# Patient Record
Sex: Female | Born: 1985 | Race: Black or African American | Hispanic: No | Smoking: Never smoker
Health system: Southern US, Community
[De-identification: ages and names within clinical notes are randomized; demographics above are authoritative.]

## PROBLEM LIST (undated history)

## (undated) ENCOUNTER — Inpatient Hospital Stay (HOSPITAL_COMMUNITY): Payer: Self-pay

## (undated) DIAGNOSIS — Z862 Personal history of diseases of the blood and blood-forming organs and certain disorders involving the immune mechanism: Secondary | ICD-10-CM

## (undated) DIAGNOSIS — Z8619 Personal history of other infectious and parasitic diseases: Secondary | ICD-10-CM

## (undated) DIAGNOSIS — R51 Headache: Secondary | ICD-10-CM

## (undated) DIAGNOSIS — R519 Headache, unspecified: Secondary | ICD-10-CM

## (undated) DIAGNOSIS — D649 Anemia, unspecified: Secondary | ICD-10-CM

## (undated) DIAGNOSIS — B009 Herpesviral infection, unspecified: Secondary | ICD-10-CM

## (undated) DIAGNOSIS — F419 Anxiety disorder, unspecified: Secondary | ICD-10-CM

## (undated) DIAGNOSIS — J45909 Unspecified asthma, uncomplicated: Secondary | ICD-10-CM

## (undated) HISTORY — PX: NO PAST SURGERIES: SHX2092

## (undated) HISTORY — DX: Personal history of diseases of the blood and blood-forming organs and certain disorders involving the immune mechanism: Z86.2

## (undated) HISTORY — DX: Personal history of other infectious and parasitic diseases: Z86.19

---
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
## 2010-06-30 ENCOUNTER — Emergency Department (HOSPITAL_COMMUNITY): Admission: EM | Admit: 2010-06-30 | Discharge: 2010-06-30 | Payer: Self-pay | Admitting: Emergency Medicine

## 2010-11-02 LAB — ABO/RH: ABO/RH(D): A POS

## 2010-11-02 LAB — WET PREP, GENITAL: Yeast Wet Prep HPF POC: NONE SEEN

## 2010-11-02 LAB — POCT PREGNANCY, URINE: Preg Test, Ur: POSITIVE

## 2010-11-02 LAB — RPR: RPR Ser Ql: NONREACTIVE

## 2011-02-04 ENCOUNTER — Inpatient Hospital Stay (HOSPITAL_COMMUNITY)
Admission: AD | Admit: 2011-02-04 | Discharge: 2011-02-04 | Disposition: A | Discharge status: Home / Self Care | Payer: Medicaid Other | Source: Ambulatory Visit | Attending: Obstetrics | Admitting: Obstetrics

## 2011-02-05 ENCOUNTER — Inpatient Hospital Stay (HOSPITAL_COMMUNITY)
Admission: AD | Admit: 2011-02-05 | Discharge: 2011-02-07 | DRG: 775 | Disposition: A | Discharge status: Home / Self Care | Payer: Medicaid Other | Source: Ambulatory Visit | Attending: Obstetrics | Admitting: Obstetrics

## 2011-02-06 LAB — CBC
Hemoglobin: 11.5 g/dL — ABNORMAL LOW (ref 12.0–15.0)
RBC: 3.79 MIL/uL — ABNORMAL LOW (ref 3.87–5.11)
WBC: 10.8 10*3/uL — ABNORMAL HIGH (ref 4.0–10.5)

## 2011-02-07 LAB — RPR: RPR Ser Ql: NONREACTIVE

## 2011-02-08 ENCOUNTER — Inpatient Hospital Stay (HOSPITAL_COMMUNITY): Admission: AD | Admit: 2011-02-08 | Payer: Self-pay | Admitting: Obstetrics & Gynecology

## 2011-07-28 ENCOUNTER — Emergency Department (HOSPITAL_COMMUNITY)
Admission: EM | Admit: 2011-07-28 | Discharge: 2011-07-28 | Disposition: A | Discharge status: Home / Self Care | Payer: Medicaid Other | Attending: Emergency Medicine | Admitting: Emergency Medicine

## 2011-07-28 DIAGNOSIS — J111 Influenza due to unidentified influenza virus with other respiratory manifestations: Secondary | ICD-10-CM | POA: Insufficient documentation

## 2011-07-28 DIAGNOSIS — R000 Tachycardia, unspecified: Secondary | ICD-10-CM | POA: Insufficient documentation

## 2011-07-28 NOTE — ED Notes (Signed)
Generalized pain in head, neck and back. States that cold/flu symptoms started yesterday. States that she had periods of being cold and hot. Did not take her temp. Has taken Alkeseltzer Plus and cough drops with not resolve to symptoms. No one else has symptoms in the home

## 2011-07-28 NOTE — ED Notes (Signed)
No complaints at present. Voices understanding of instructions given. Walked to check out window.  

## 2011-07-28 NOTE — ED Notes (Signed)
States that she is nauseated to complaints of V/D.

## 2011-07-28 NOTE — ED Notes (Signed)
Pt. Reports cough, runny nose, congestion, back/neck pain, and fever.  Pt. Reports nausea, denies vomiting.

## 2011-07-28 NOTE — ED Provider Notes (Signed)
Complains of cough, nonproductive rhinorrhea sore throat headache and cough onset 2 days ago. On exam alert nontoxic lungs clear to auscultation heart regular rate and rhythm rate 108, abdomen nondistended nontender Suspect flu  Doug Sou, MD 07/28/11 1158

## 2011-07-28 NOTE — ED Provider Notes (Signed)
History     CSN: [id number] Arrival date & time: 07/28/2011 10:18 AM   First MD Initiated Contact with Patient 07/28/11 1109      Chief Complaint  Patient presents with  . Headache  . Nasal Congestion  . Cough    (Consider location/radiation/quality/duration/timing/severity/associated sxs/prior Treatment)  HPI Patient is an otherwise healthy 25 year old woman who presents with 2 days of sore throat, rhinorrhea, headache, cough, chest tightness, fevers and chills, and myalgias. She also describes nausea, with no vomiting. She reports 3 loose stools over the past 48 hours. She reports having a flu shot this year. She has no sick contacts.  Past Medical History  Diagnosis Date  . Tuberculosis     History reviewed. No pertinent past surgical history.  No family history on file.  History  Substance Use Topics  . Smoking status: Never Smoker   . Smokeless tobacco: Not on file  . Alcohol Use: No    OB History    Grav Para Term Preterm Abortions TAB SAB Ect Mult Living                  Review of Systems  Allergies  Review of patient's allergies indicates no known allergies.  Home Medications   Current Outpatient Rx  Name Route Sig Dispense Refill  . ASPIRIN 325 MG PO TABS Oral Take 325 mg by mouth every 4 (four) hours as needed. For pain     . SODIUM & POTASSIUM BICARBONATE PO TBEF Oral Take 1 tablet by mouth 2 (two) times daily as needed. For cold symptoms       BP 121/69  Pulse 120  Temp(Src) 100.5 F (38.1 C) (Oral)  Ht 5\' 3"  (1.6 m)  Wt 137 lb (62.143 kg)  BMI 24.27 kg/m2  SpO2 100%  LMP 07/10/2011  Physical Exam GEN: No apparent distress.  Alert and oriented x 3.  Pleasant, conversant, and cooperative to exam. HEENT: head is autraumatic and normocephalic.  Neck is supple without palpable masses or lymphadenopathy.  EOMI.  PERRLA.  Sclerae anicteric.  Conjunctivae without pallor or injection.  Mucous membranes are moist.  Oropharynx is without erythema,  exudates, or other abnormal lesions. RESP:  Lungs are clear to ascultation bilaterally with good air movement.  No wheezes, ronchi, or rubs. CARDIOVASCULAR: tachycardic, normal rhythm.  Clear S1, S2, no murmurs, gallops, or rubs. ABDOMEN: soft, non-tender, non-distended.  Bowels sounds present in all quadrants and normoactive.  No palpable masses. EXT: warm and dry.  Peripheral pulses equal, intact, and +2 globally.  No clubbing or cyanosis.  No edema in b/l lower extremeties SKIN: warm and dry with normal turgor.  No rashes or abnormal lesions observed. NEURO: ambulating w/o difficulty  ED Course  Procedures (including critical care time)  Labs Reviewed - No data to display No results found.   No diagnosis found.    MDM  25 year old otherwise healthy woman with acute onset of flulike symptoms. Repeat heart rate was 108, temperature 100.5. Suspect influenza versus URI. Patient able to tolerate PO, so we will discharge with instructions for Tylenol and fluids.        Kathreen Cosier, MD 07/28/11 1201

## 2013-03-25 ENCOUNTER — Inpatient Hospital Stay (HOSPITAL_COMMUNITY)
Admission: AD | Admit: 2013-03-25 | Discharge: 2013-03-26 | Disposition: A | Discharge status: Home / Self Care | Payer: Medicaid Other | Source: Ambulatory Visit | Attending: Obstetrics and Gynecology | Admitting: Obstetrics and Gynecology

## 2013-03-25 DIAGNOSIS — O4690 Antepartum hemorrhage, unspecified, unspecified trimester: Secondary | ICD-10-CM

## 2013-03-25 DIAGNOSIS — O209 Hemorrhage in early pregnancy, unspecified: Secondary | ICD-10-CM | POA: Insufficient documentation

## 2013-03-25 DIAGNOSIS — R109 Unspecified abdominal pain: Secondary | ICD-10-CM | POA: Insufficient documentation

## 2013-03-26 ENCOUNTER — Encounter (HOSPITAL_COMMUNITY): Payer: Self-pay | Admitting: *Deleted

## 2013-03-26 ENCOUNTER — Inpatient Hospital Stay (HOSPITAL_COMMUNITY): Payer: Medicaid Other

## 2013-03-26 LAB — CBC
Hemoglobin: 11.6 g/dL — ABNORMAL LOW (ref 12.0–15.0)
MCHC: 34.8 g/dL (ref 30.0–36.0)
RBC: 4 MIL/uL (ref 3.87–5.11)
WBC: 6.6 10*3/uL (ref 4.0–10.5)

## 2013-03-26 LAB — WET PREP, GENITAL
Clue Cells Wet Prep HPF POC: NONE SEEN
Trich, Wet Prep: NONE SEEN
Yeast Wet Prep HPF POC: NONE SEEN

## 2013-03-26 LAB — URINALYSIS, ROUTINE W REFLEX MICROSCOPIC
Glucose, UA: NEGATIVE mg/dL
Protein, ur: NEGATIVE mg/dL

## 2013-03-26 LAB — URINE MICROSCOPIC-ADD ON

## 2013-03-26 LAB — HCG, QUANTITATIVE, PREGNANCY: hCG, Beta Chain, Quant, S: 4496 m[IU]/mL — ABNORMAL HIGH (ref <5)

## 2013-03-26 LAB — POCT PREGNANCY, URINE: Preg Test, Ur: POSITIVE — AB

## 2013-03-26 NOTE — MAU Provider Note (Signed)
History     CSN: [id number]  Arrival date and time: 03/25/13 2358   First Provider Initiated Contact with Patient 03/26/13 0052      No chief complaint on file.  HPI [name] is a 27 y.o. [phone] at [redacted]w[redacted]d who presents to MAU today with complain of vaginal bleeding and lower abdominal pain since ~ 2200 today. The [facility] They did not do an exam. She noted blood in her underwear while at church tonight and then moderate cramping started afterwards. She rates her pain at 6/10 now. She denies any pain medications. She denies other discharge, UTI symptoms or diarrhea. She has had occasional N/V throughout the pregnancy as well as constipation. She states that she felt hot earlier tonight when she was nauseous, but denies true fever. LMP was 02/11/13  OB History   Grav Para Term Preterm Abortions TAB SAB Ect Mult Living   5 3 3  1  1   3       Past Medical History  Diagnosis Date  . Tuberculosis     History reviewed. No pertinent past surgical history.  History reviewed. No pertinent family history.  History  Substance Use Topics  . Smoking status: Never Smoker   . Smokeless tobacco: Not on file  . Alcohol Use: No    Allergies: No Known Allergies  Prescriptions prior to admission  Medication Sig Dispense Refill  . ibuprofen (ADVIL,MOTRIN) 200 MG tablet Take 400 mg by mouth every 6 (six) hours as needed for pain.        Review of Systems  Constitutional: Negative for fever, chills and malaise/fatigue.  Gastrointestinal: Positive for nausea, vomiting, abdominal pain and constipation. Negative for diarrhea.  Genitourinary: Negative for dysuria, urgency and frequency.       + vaginal bleeding Neg - vaginal discharge  Neurological: Negative for dizziness and loss of consciousness.   Physical Exam   Blood pressure 116/74, pulse 89, temperature 98 F (36.7 C), temperature source Oral, height  5\' 3"  (1.6 m), weight 170 lb (77.111 kg), last menstrual period 02/11/2013, unknown if currently breastfeeding.  Physical Exam  Constitutional: She is oriented to person, place, and time. She appears well-developed and well-nourished. No distress.  HENT:  Head: Normocephalic and atraumatic.  Cardiovascular: Normal rate, regular rhythm and normal heart sounds.   Respiratory: Effort normal and breath sounds normal. No respiratory distress.  GI: Soft. Bowel sounds are normal. She exhibits no distension and no mass. There is tenderness (mild tenderness to palpation of the suprapubic region at midline). There is no rebound and no guarding.  Genitourinary: Uterus is enlarged (exam limited by maternal body habitus) and tender. Cervix exhibits friability. Cervix exhibits no motion tenderness and no discharge. Right adnexum displays tenderness. Right adnexum displays no mass. Left adnexum displays no mass and no tenderness. No bleeding around the vagina. Vaginal discharge (scant mucus discharge noted) found.  Neurological: She is alert and oriented to person, place, and time.  Skin: Skin is warm and dry. No erythema.  Psychiatric: She has a normal mood and affect.   Results for orders placed during the hospital encounter of 03/25/13 (from the past 24 hour(s))  URINALYSIS, ROUTINE W REFLEX MICROSCOPIC     Status: Abnormal   Collection Time    03/26/13 12:26 AM      Result Value Range   Color, Urine YELLOW  YELLOW   APPearance CLEAR  CLEAR   Specific  Gravity, Urine <1.005 (*) 1.005 - 1.030   pH 6.0  5.0 - 8.0   Glucose, UA NEGATIVE  NEGATIVE mg/dL   Hgb urine dipstick MODERATE (*) NEGATIVE   Bilirubin Urine NEGATIVE  NEGATIVE   Ketones, ur NEGATIVE  NEGATIVE mg/dL   Protein, ur NEGATIVE  NEGATIVE mg/dL   Urobilinogen, UA 0.2  0.0 - 1.0 mg/dL   Nitrite NEGATIVE  NEGATIVE   Leukocytes, UA NEGATIVE  NEGATIVE  URINE MICROSCOPIC-ADD ON     Status: None   Collection Time    03/26/13 12:26 AM       Result Value Range   Squamous Epithelial / LPF RARE  RARE   WBC, UA 0-2  <3 WBC/hpf   RBC / HPF 0-2  <3 RBC/hpf  POCT PREGNANCY, URINE     Status: Abnormal   Collection Time    03/26/13 12:41 AM      Result Value Range   Preg Test, Ur POSITIVE (*) NEGATIVE  CBC     Status: Abnormal   Collection Time    03/26/13 12:50 AM      Result Value Range   WBC 6.6  4.0 - 10.5 K/uL   RBC 4.00  3.87 - 5.11 MIL/uL   Hemoglobin 11.6 (*) 12.0 - 15.0 g/dL   HCT 16.1 (*) 09.6 - 04.5 %   MCV 83.3  78.0 - 100.0 fL   MCH 29.0  26.0 - 34.0 pg   MCHC 34.8  30.0 - 36.0 g/dL   RDW 40.9  81.1 - 91.4 %   Platelets 227  150 - 400 K/uL  HCG, QUANTITATIVE, PREGNANCY     Status: Abnormal   Collection Time    03/26/13 12:50 AM      Result Value Range   hCG, Beta Chain, Quant, S 4496 (*) <5 mIU/mL  WET PREP, GENITAL     Status: Abnormal   Collection Time    03/26/13  1:28 AM      Result Value Range   Yeast Wet Prep HPF POC NONE SEEN  NONE SEEN   Trich, Wet Prep NONE SEEN  NONE SEEN   Clue Cells Wet Prep HPF POC NONE SEEN  NONE SEEN   WBC, Wet Prep HPF POC MODERATE (*) NONE SEEN   US Ob Comp Less 14 Wks  03/26/2013   *RADIOLOGY REPORT*  Clinical Data: Pain in patient with vaginal bleeding.  Cramping. Quantitative HCG 4496.  OBSTETRIC <14 WK [place] AND TRANSVAGINAL OB US  Technique:  Both transabdominal and transvaginal ultrasound examinations were performed for complete evaluation of the gestation as well as the maternal uterus, adnexal regions, and pelvic cul-de-sac.  Transvaginal technique was performed to assess early pregnancy.  Comparison:  None.  Intrauterine gestational sac:  Visualized/normal in shape. Yolk sac: Visualized. Embryo: Not visualized. Cardiac Activity: Not applicable.  MSD: 0.56 mm  5 w 1 d         [place] EDC: 11/25/2013  Maternal uterus/adnexae: Unremarkable.  Small amount of free pelvic fluid is noted.  IMPRESSION: Probable early intrauterine gestational sac, but no fetal pole, or cardiac activity  yet visualized.  Recommend follow-up quantitative B-HCG levels and follow-up US in 14 days to confirm and assess viability.   Original Report Authenticated By: Holley Dexter, M.D.   US Ob Transvaginal  03/26/2013   *RADIOLOGY REPORT*  Clinical Data: Pain in patient with vaginal bleeding.  Cramping. Quantitative HCG 4496.  OBSTETRIC <14 WK [place] AND TRANSVAGINAL OB US  Technique:  Both transabdominal  and transvaginal ultrasound examinations were performed for complete evaluation of the gestation as well as the maternal uterus, adnexal regions, and pelvic cul-de-sac.  Transvaginal technique was performed to assess early pregnancy.  Comparison:  None.  Intrauterine gestational sac:  Visualized/normal in shape. Yolk sac: Visualized. Embryo: Not visualized. Cardiac Activity: Not applicable.  MSD: 0.56 mm  5 w 1 d         [place] EDC: 11/25/2013  Maternal uterus/adnexae: Unremarkable.  Small amount of free pelvic fluid is noted.  IMPRESSION: Probable early intrauterine gestational sac, but no fetal pole, or cardiac activity yet visualized.  Recommend follow-up quantitative B-HCG levels and follow-up US in 14 days to confirm and assess viability.   Original Report Authenticated By: Holley Dexter, M.D.    MAU Course  Procedures None  MDM UPT - positive UA, Wet prep, GC/Chlamydia, CBC, quant hCG and [place] today A+ blood type from previous visit in Epic Patient educated about stopping ibuprofen and using tylenol for pain. Advised to start prenatal care ASAP. Discussed likelihood of vaginal bleeding after intercourse or exam.   Assessment and Plan  A: IUGS and YS at 5w 1d Vaginal bleeding in early pregnancy, most likely secondary to recent intercourse  P: Discharge home Pelvic rest advised Bleeding precautions discussed [facility] and [facility] to go to Dr. Gaynell Face for prenatal care Patient may return to MAU as needed or if her condition were to change or  worsen  Freddi Starr, PA-C  03/26/2013, 2:37 AM

## 2013-03-26 NOTE — MAU Note (Addendum)
PT SAYS SHE WAS AT [facility] TONIGHT AT 1030-   SHE WENT TO B-ROOM- SAW BLOOD ON UNDERWEAR-  DARK- THEN WIPED-  LIGHT RED.   AT 2310-  STARTED HAVING   CRAMPING.   . IN TRIAGE - NO PAD. CRAMPS-  NO TO BAD.    [facility]-   TO [facility]FIRM PREG.  LAST SEX-  Friday.

## 2013-03-27 NOTE — MAU Provider Note (Signed)
Attestation of Attending Supervision of Advanced Practitioner (CNM/NP): Evaluation and management procedures were performed by the Advanced Practitioner under my supervision and collaboration.  I have reviewed the Advanced Practitioner's note and chart, and I agree with the management and plan.  Crystalle Popwell 03/27/2013 10:29 AM

## 2013-04-22 ENCOUNTER — Encounter (HOSPITAL_COMMUNITY): Payer: Self-pay

## 2013-04-22 ENCOUNTER — Inpatient Hospital Stay (HOSPITAL_COMMUNITY)
Admission: AD | Admit: 2013-04-22 | Discharge: 2013-04-22 | Disposition: A | Discharge status: Home / Self Care | Payer: Medicaid Other | Source: Ambulatory Visit | Attending: Obstetrics and Gynecology | Admitting: Obstetrics and Gynecology

## 2013-04-22 DIAGNOSIS — N949 Unspecified condition associated with female genital organs and menstrual cycle: Secondary | ICD-10-CM | POA: Insufficient documentation

## 2013-04-22 DIAGNOSIS — R059 Cough, unspecified: Secondary | ICD-10-CM | POA: Insufficient documentation

## 2013-04-22 DIAGNOSIS — O99891 Other specified diseases and conditions complicating pregnancy: Secondary | ICD-10-CM | POA: Insufficient documentation

## 2013-04-22 DIAGNOSIS — R05 Cough: Secondary | ICD-10-CM | POA: Insufficient documentation

## 2013-04-22 DIAGNOSIS — J329 Chronic sinusitis, unspecified: Secondary | ICD-10-CM

## 2013-04-22 DIAGNOSIS — B9689 Other specified bacterial agents as the cause of diseases classified elsewhere: Secondary | ICD-10-CM

## 2013-04-22 DIAGNOSIS — J029 Acute pharyngitis, unspecified: Secondary | ICD-10-CM | POA: Insufficient documentation

## 2013-04-22 DIAGNOSIS — A499 Bacterial infection, unspecified: Secondary | ICD-10-CM

## 2013-04-22 DIAGNOSIS — J019 Acute sinusitis, unspecified: Secondary | ICD-10-CM | POA: Insufficient documentation

## 2013-04-22 MED ORDER — AMOXICILLIN-POT CLAVULANATE 875-125 MG PO TABS: 1.0000 | ORAL_TABLET | Freq: Two times a day (BID) | ORAL | Status: DC

## 2013-04-22 NOTE — MAU Note (Signed)
Coughing/uri s/s began last saturday

## 2013-04-22 NOTE — MAU Provider Note (Signed)
History     CSN: [id number]  Arrival date and time: 04/22/13 1314   First Provider Initiated Contact with Patient 04/22/13 1516      Chief Complaint  Patient presents with  . URI   HPI [name] is a 27 y.o. [phone] at [redacted]w[redacted]d who presents to MAU today with complaint of sore throat, cough, nasal congestions, headache and ear pain x 9 days. The patient has tried OTC medications since symptom onset without significant relief. She endorses subjective fever "a couple of times" this week, but has never taken a temperature. She denies abdominal pain or vaginal bleeding today. She has had some increase in discharge, but states this is similar to previous and declines further exam.   OB History   Grav Para Term Preterm Abortions TAB SAB Ect Mult Living   5 3 3  1  1   3       Past Medical History  Diagnosis Date  . Tuberculosis     Past Surgical History  Procedure Laterality Date  . No past surgeries      History reviewed. No pertinent family history.  History  Substance Use Topics  . Smoking status: Never Smoker   . Smokeless tobacco: Never Used  . Alcohol Use: No    Allergies: No Known Allergies  No prescriptions prior to admission    Review of Systems  Constitutional: Positive for fever. Negative for malaise/fatigue.  HENT: Positive for ear pain, congestion and sore throat.   Respiratory: Positive for cough, sputum production and shortness of breath.   Gastrointestinal: Positive for nausea and vomiting. Negative for abdominal pain.  Genitourinary:       Neg - vaginal bleeding + discharge  Neurological: Positive for headaches.   Physical Exam   Blood pressure 116/77, pulse 92, temperature 98.3 F (36.8 C), temperature source Oral, resp. rate 16, last menstrual period 02/11/2013, SpO2 100.00%.  Physical Exam  Constitutional: She is oriented to person, place, and time. She appears well-developed and well-nourished. No distress.  HENT:  Head:  Normocephalic and atraumatic.  Right Ear: External ear normal.  Left Ear: External ear normal.  Nose: Mucosal edema and rhinorrhea present. No epistaxis. Right sinus exhibits maxillary sinus tenderness and frontal sinus tenderness. Left sinus exhibits maxillary sinus tenderness and frontal sinus tenderness.  Mouth/Throat: Posterior oropharyngeal erythema present. No oropharyngeal exudate, posterior oropharyngeal edema or tonsillar abscesses.  Cardiovascular: Normal rate, regular rhythm and normal heart sounds.   Respiratory: Effort normal and breath sounds normal. No respiratory distress. She has no wheezes. She has no rales.  Lymphadenopathy:       Head (right side): No submental, no submandibular and no tonsillar adenopathy present.       Head (left side): No submental, no submandibular and no tonsillar adenopathy present.  Neurological: She is alert and oriented to person, place, and time.  Skin: Skin is warm and dry. No erythema.    MAU Course  Procedures None  MDM Patient has had symptoms x 9 days and may be progressing to a acute bacterial sinusitis  Discussed OTC symtpoms managament approved in pregnancy  Assessment and Plan  A: Acute Bacterial Sinusitis  P: Discharge home Rx for Augmentin sent to patient's pharmacy Patient advised of OTC medications for symptoms management approved in pregnancy Patient advised to rest and increased PO hydration as tolerated Patient advised to start prenatal care ASAP Patient may return to MAU as needed or if her condition were to change or worsen  Raynelle Fanning  Donzetta Starch, PA-C  04/22/2013, 5:18 PM

## 2013-04-22 NOTE — MAU Note (Signed)
Past wk, severe cold, hard to breath, every time she coughs hard- it makes her throw up.  Has tried different meds that pharmacy has told her she can take, doesn't seem to be helping.  Hearing seems muffled in both ears.  Thinks had fever, never checked it.

## 2013-04-22 NOTE — MAU Note (Signed)
Pt denies bleeding, does have yellow vaginal discharge with slight odor. Denies abdominal pain.

## 2013-04-23 NOTE — MAU Provider Note (Signed)
Attestation of Attending Supervision of Advanced Practitioner (CNM/NP): Evaluation and management procedures were performed by the Advanced Practitioner under my supervision and collaboration.  I have reviewed the Advanced Practitioner's note and chart, and I agree with the management and plan.  Dima Mini 04/23/2013 8:07 AM

## 2013-05-29 LAB — OB RESULTS CONSOLE GC/CHLAMYDIA
Chlamydia: NEGATIVE
GC PROBE AMP, GENITAL: NEGATIVE

## 2013-05-29 LAB — OB RESULTS CONSOLE ANTIBODY SCREEN: Antibody Screen: NEGATIVE

## 2013-05-29 LAB — OB RESULTS CONSOLE ABO/RH: RH TYPE: POSITIVE

## 2013-05-29 LAB — OB RESULTS CONSOLE HIV ANTIBODY (ROUTINE TESTING): HIV: NONREACTIVE

## 2013-05-29 LAB — OB RESULTS CONSOLE RPR: RPR: NONREACTIVE

## 2013-05-29 LAB — OB RESULTS CONSOLE HEPATITIS B SURFACE ANTIGEN: HEP B S AG: NEGATIVE

## 2013-05-29 LAB — OB RESULTS CONSOLE RUBELLA ANTIBODY, IGM: Rubella: IMMUNE

## 2013-08-22 NOTE — L&D Delivery Note (Signed)
Delivery Note At 2:35 PM a viable female was delivered via  (Presentation: ;  ).  APGAR: , ; weight .   Placenta status: , .  Cord:  with the following complications: .  Cord pH: not done  Anesthesia:   Episiotomy:  Lacerations:  Suture Repair: 2.0 Est. Blood Loss (mL):   Mom to postpartum.  Baby to Couplet care / Skin to Skin.  MARSHALL,BERNARD A 11/14/2013, 2:42 PM

## 2013-08-28 ENCOUNTER — Encounter (HOSPITAL_COMMUNITY): Payer: Self-pay | Admitting: *Deleted

## 2013-08-28 ENCOUNTER — Inpatient Hospital Stay (HOSPITAL_COMMUNITY)
Admission: AD | Admit: 2013-08-28 | Discharge: 2013-08-28 | Disposition: A | Discharge status: Home / Self Care | Payer: Medicaid Other | Source: Ambulatory Visit | Attending: Obstetrics | Admitting: Obstetrics

## 2013-08-28 DIAGNOSIS — O99891 Other specified diseases and conditions complicating pregnancy: Secondary | ICD-10-CM | POA: Insufficient documentation

## 2013-08-28 DIAGNOSIS — R059 Cough, unspecified: Secondary | ICD-10-CM | POA: Insufficient documentation

## 2013-08-28 DIAGNOSIS — J111 Influenza due to unidentified influenza virus with other respiratory manifestations: Secondary | ICD-10-CM

## 2013-08-28 DIAGNOSIS — R05 Cough: Secondary | ICD-10-CM | POA: Insufficient documentation

## 2013-08-28 DIAGNOSIS — O9989 Other specified diseases and conditions complicating pregnancy, childbirth and the puerperium: Principal | ICD-10-CM

## 2013-08-28 DIAGNOSIS — N898 Other specified noninflammatory disorders of vagina: Secondary | ICD-10-CM | POA: Insufficient documentation

## 2013-08-28 HISTORY — DX: Anemia, unspecified: D64.9

## 2013-08-28 LAB — URINALYSIS, ROUTINE W REFLEX MICROSCOPIC
Bilirubin Urine: NEGATIVE
Glucose, UA: NEGATIVE mg/dL
Hgb urine dipstick: NEGATIVE
Ketones, ur: NEGATIVE mg/dL
LEUKOCYTES UA: NEGATIVE
NITRITE: NEGATIVE
PROTEIN: NEGATIVE mg/dL
Specific Gravity, Urine: 1.015 (ref 1.005–1.030)
UROBILINOGEN UA: 1 mg/dL (ref 0.0–1.0)
pH: 7.5 (ref 5.0–8.0)

## 2013-08-28 LAB — WET PREP, GENITAL
Clue Cells Wet Prep HPF POC: NONE SEEN
Trich, Wet Prep: NONE SEEN
WBC, Wet Prep HPF POC: NONE SEEN
YEAST WET PREP: NONE SEEN

## 2013-08-28 LAB — INFLUENZA PANEL BY PCR (TYPE A & B)
H1N1FLUPCR: NOT DETECTED
INFLBPCR: NEGATIVE
Influenza A By PCR: NEGATIVE

## 2013-08-28 MED ORDER — OSELTAMIVIR PHOSPHATE 75 MG PO CAPS: 75.0000 mg | ORAL_CAPSULE | Freq: Two times a day (BID) | ORAL | Status: DC

## 2013-08-28 MED ORDER — ACETAMINOPHEN-CODEINE #3 300-30 MG PO TABS: 1.0000 | ORAL_TABLET | Freq: Four times a day (QID) | ORAL | Status: DC | PRN

## 2013-08-28 NOTE — Discharge Instructions (Signed)

## 2013-08-28 NOTE — MAU Provider Note (Signed)
History     CSN: [id number]  Arrival date and time: 08/28/13 1133   First Provider Initiated Contact with Patient 08/28/13 1230      Chief Complaint  Patient presents with  . Nasal Congestion  . Sore Throat  . Cough  . Fever  . Vaginal Discharge   HPI Comments: [name] 28 y.o. [id number] presents to MAU with 2 issues. First being cough nonproductive, sore throat, fever yesterday not today, all ongoing for 2 days. Second issue is vaginal discharge that is yellow and has odor. She had a new partner last week. She is 27 weeks and 6 days pregnant.    Sore Throat  Associated symptoms include coughing.  Cough Associated symptoms include a fever and a sore throat.  Fever  Associated symptoms include coughing and a sore throat.  Vaginal Discharge Associated symptoms include a fever and a sore throat.      Past Medical History  Diagnosis Date  . Tuberculosis   . Anemia     Past Surgical History  Procedure Laterality Date  . No past surgeries      History reviewed. No pertinent family history.  History  Substance Use Topics  . Smoking status: Never Smoker   . Smokeless tobacco: Never Used  . Alcohol Use: No    Allergies: No Known Allergies  Prescriptions prior to admission  Medication Sig Dispense Refill  . DM-Phenylephrine-Acetaminophen (TYLENOL COLD MULTI-SYMPTOM DAY PO) Take 1 tablet by mouth every 4 (four) hours as needed (cold symptoms).      . Prenatal Vit-Fe Fumarate-FA (PRENATAL MULTIVITAMIN) TABS tablet Take 1 tablet by mouth daily at 12 noon.      . pseudoephedrine (SUDAFED) 120 MG 12 hr tablet Take 120 mg by mouth daily as needed for congestion (For nasal congestion and sinus pressure.).         Review of Systems  Constitutional: Positive for fever.  HENT: Positive for sore throat.   Respiratory: Positive for cough.   Genitourinary: Negative.        Vaginal discharge  Musculoskeletal: Negative.   Neurological: Negative.    Psychiatric/Behavioral: Negative.    Physical Exam   Blood pressure 106/63, pulse 95, temperature 98.8 F (37.1 C), temperature source Oral, resp. rate 16, height 5\' 3"  (1.6 m), weight 72.576 kg (160 lb), last menstrual period 02/11/2013, SpO2 100.00%.  Physical Exam  Constitutional: She appears well-developed and well-nourished. No distress.  HENT:  Head: Normocephalic and atraumatic.  Eyes: Pupils are equal, round, and reactive to light.  Neck: Normal range of motion.  Cardiovascular: Normal rate, regular rhythm and normal heart sounds.   Respiratory: Effort normal and breath sounds normal.  GI: Soft. Bowel sounds are normal.  Genitourinary: Vagina normal and uterus normal. No vaginal discharge found.  Cervix long and closed  Musculoskeletal: Normal range of motion.  Neurological: She is alert.  Skin: Skin is warm and dry.  Psychiatric: She has a normal mood and affect. Her behavior is normal. Judgment and thought content normal.   Results for orders placed during the hospital encounter of 08/28/13 (from the past 24 hour(s))  URINALYSIS, ROUTINE W REFLEX MICROSCOPIC     Status: None   Collection Time    08/28/13 11:50 AM      Result Value Range   Color, Urine YELLOW  YELLOW   APPearance CLEAR  CLEAR   Specific Gravity, Urine 1.015  1.005 - 1.030   pH 7.5  5.0 - 8.0   Glucose, UA  NEGATIVE  NEGATIVE mg/dL   Hgb urine dipstick NEGATIVE  NEGATIVE   Bilirubin Urine NEGATIVE  NEGATIVE   Ketones, ur NEGATIVE  NEGATIVE mg/dL   Protein, ur NEGATIVE  NEGATIVE mg/dL   Urobilinogen, UA 1.0  0.0 - 1.0 mg/dL   Nitrite NEGATIVE  NEGATIVE   Leukocytes, UA NEGATIVE  NEGATIVE  WET PREP, GENITAL     Status: None   Collection Time    08/28/13 12:45 PM      Result Value Range   Yeast Wet Prep HPF POC NONE SEEN  NONE SEEN   Trich, Wet Prep NONE SEEN  NONE SEEN   Clue Cells Wet Prep HPF POC NONE SEEN  NONE SEEN   WBC, Wet Prep HPF POC NONE SEEN  NONE SEEN    MAU Course   Procedures  MDM  GC, Chlamydia, wet prep, Flu swab  Assessment and Plan   A: ? Flu verses URI  P: Tamiflu 75 mg po bid x 5 days Tylenol # 3 po q4 hours prn Purchase OTC decongestants Rest/ Fluids Follow up with Dr Georga Hacking, Milas Kocher 08/28/2013, 1:08 PM

## 2013-08-28 NOTE — MAU Note (Signed)
Patient states she started having cold symptoms early 1-5. Has progressed and now has S/S of the flu, cough, sore throat, fever runny nose and congestion. Vaginal discharge, yellow with odor. Denies any contractions, leaking or bleeding. Reports good fetal movement.

## 2013-08-29 LAB — GC/CHLAMYDIA PROBE AMP
CT Probe RNA: NEGATIVE
GC Probe RNA: NEGATIVE

## 2013-09-06 ENCOUNTER — Encounter (HOSPITAL_COMMUNITY): Payer: Self-pay | Admitting: *Deleted

## 2013-09-06 ENCOUNTER — Inpatient Hospital Stay (HOSPITAL_COMMUNITY)
Admission: AD | Admit: 2013-09-06 | Discharge: 2013-09-06 | Disposition: A | Discharge status: Home / Self Care | Payer: Medicaid Other | Source: Ambulatory Visit | Attending: Obstetrics | Admitting: Obstetrics

## 2013-09-06 DIAGNOSIS — O99891 Other specified diseases and conditions complicating pregnancy: Secondary | ICD-10-CM | POA: Insufficient documentation

## 2013-09-06 DIAGNOSIS — J069 Acute upper respiratory infection, unspecified: Secondary | ICD-10-CM | POA: Insufficient documentation

## 2013-09-06 DIAGNOSIS — O9989 Other specified diseases and conditions complicating pregnancy, childbirth and the puerperium: Principal | ICD-10-CM

## 2013-09-06 DIAGNOSIS — R51 Headache: Secondary | ICD-10-CM | POA: Insufficient documentation

## 2013-09-06 DIAGNOSIS — J3489 Other specified disorders of nose and nasal sinuses: Secondary | ICD-10-CM | POA: Insufficient documentation

## 2013-09-06 LAB — URINALYSIS, ROUTINE W REFLEX MICROSCOPIC
BILIRUBIN URINE: NEGATIVE
Glucose, UA: NEGATIVE mg/dL
Hgb urine dipstick: NEGATIVE
Ketones, ur: NEGATIVE mg/dL
Leukocytes, UA: NEGATIVE
NITRITE: NEGATIVE
Protein, ur: NEGATIVE mg/dL
SPECIFIC GRAVITY, URINE: 1.015 (ref 1.005–1.030)
UROBILINOGEN UA: 1 mg/dL (ref 0.0–1.0)
pH: 7 (ref 5.0–8.0)

## 2013-09-06 MED ORDER — PSEUDOEPHEDRINE HCL 30 MG PO TABS
60.0000 mg | ORAL_TABLET | Freq: Once | ORAL | Status: AC
  Administered 2013-09-06: 60 mg via ORAL
  Filled 2013-09-06: qty 2

## 2013-09-06 MED ORDER — BUTALBITAL-APAP-CAFFEINE 50-325-40 MG PO TABS: 1.0000 | ORAL_TABLET | Freq: Four times a day (QID) | ORAL | Status: DC | PRN

## 2013-09-06 NOTE — MAU Note (Signed)
Headache started this morning, runny nose since she was last here.  Denies fever or chills.

## 2013-09-06 NOTE — MAU Provider Note (Signed)
Chief Complaint:  Headache and Nasal Congestion  First Provider Initiated Contact with Patient 09/06/13 [phone]     HPI: [name] is a 28 y.o. [id number] at [redacted]w[redacted]d who presents to maternity admissions reporting right sinus headache radiating down her right side of her neck. Has has URI Sx x 1/ 5 weeks. Neg flu PCR 1 week ago. Took 1 Tylenol #3 last night w/ partial relief, but ran out. Denies fever, chills, contractions, leakage of fluid or vaginal bleeding. Good fetal movement.   Past Medical History: Past Medical History  Diagnosis Date  . Tuberculosis   . Anemia     Past obstetric history: OB History  Gravida Para Term Preterm AB SAB TAB Ectopic Multiple Living  5 3 3  1 1    3     # Outcome Date GA Lbr Len/2nd Weight Sex Delivery Anes PTL Lv  5 CUR           4 TRM     F SVD   Y  3 SAB           2 TRM     M SVD   Y  1 TRM     F SVD   Y      Past Surgical History: Past Surgical History  Procedure Laterality Date  . No past surgeries       Family History: Family History  Problem Relation Age of Onset  . Hypertension Mother   . Diabetes Father     Social History: History  Substance Use Topics  . Smoking status: Never Smoker   . Smokeless tobacco: Never Used  . Alcohol Use: No    Allergies: No Known Allergies  Meds:  Prescriptions prior to admission  Medication Sig Dispense Refill  . acetaminophen-codeine (TYLENOL #3) 300-30 MG per tablet Take 1-2 tablets by mouth every 6 (six) hours as needed for moderate pain.  15 tablet  0  . DM-Phenylephrine-Acetaminophen (TYLENOL COLD MULTI-SYMPTOM DAY PO) Take 1 tablet by mouth every 4 (four) hours as needed (cold symptoms).      Marland Kitchen oseltamivir (TAMIFLU) 75 MG capsule Take 1 capsule (75 mg total) by mouth every 12 (twelve) hours.  10 capsule  0  . Prenatal Vit-Fe Fumarate-FA (PRENATAL MULTIVITAMIN) TABS tablet Take 1 tablet by mouth daily at 12 noon.      . pseudoephedrine (SUDAFED) 120 MG 12 hr tablet Take 120 mg by  mouth daily as needed for congestion (For nasal congestion and sinus pressure.).         ROS: Pertinent findings in history of present illness.  Physical Exam  Blood pressure 113/63, pulse 97, temperature 98.5 F (36.9 C), temperature source Oral, resp. rate 16, last menstrual period 02/11/2013. GENERAL: Well-developed, well-nourished female in no acute distress. Non-toxic appearing.   HEENT: normocephalic. Right frontal and maxillary sinus tenderness. Congestion. Normal ROM of neck.  HEART: RRR RESP: normal effort. CTAB.  NEURO: alert and oriented  FHT:  Baseline 140 , moderate variability, accelerations present, no decelerations Contractions: none   Labs: Results for orders placed during the hospital encounter of 09/06/13 (from the past 24 hour(s))  URINALYSIS, ROUTINE W REFLEX MICROSCOPIC     Status: None   Collection Time    09/06/13  7:25 AM      Result Value Range   Color, Urine YELLOW  YELLOW   APPearance CLEAR  CLEAR   Specific Gravity, Urine 1.015  1.005 - 1.030   pH 7.0  5.0 - 8.0  Glucose, UA NEGATIVE  NEGATIVE mg/dL   Hgb urine dipstick NEGATIVE  NEGATIVE   Bilirubin Urine NEGATIVE  NEGATIVE   Ketones, ur NEGATIVE  NEGATIVE mg/dL   Protein, ur NEGATIVE  NEGATIVE mg/dL   Urobilinogen, UA 1.0  0.0 - 1.0 mg/dL   Nitrite NEGATIVE  NEGATIVE   Leukocytes, UA NEGATIVE  NEGATIVE    Imaging:  No results found.  MAU Course: Sudafed given. Offered Tylenol #3 or Fioricet, but pt driving. Will Rx. Doubt bacterial sinusitis in absence of fever or significant sinus pressure.  Assessment: 1. URI (upper respiratory infection)   2. Other current maternal conditions classifiable elsewhere, antepartum     Plan: Discharge home in stable condition. Comfort measures. Headache red flags reviewed. Increase fluids and rest. Encouraged Sudafed for congestion, Mucinex to loosen up sinus congestion. Preterm labor precautions and fetal kick counts Do not exceed 4000 mg of Tylenol  in 24 hours from all sources. Follow-up Information   Follow up with Frederico Hamman, MD On 09/17/2013. (as scheduled or as needed if symptoms worsen)    Specialty:  Obstetrics and Gynecology   Contact information:   [place] [id number] [phone]       Follow up with [facility]. (As needed if symptoms worsen)    Contact information:   [place] [id number] [phone]       Medication List    STOP taking these medications       acetaminophen-codeine 300-30 MG per tablet  Commonly known as:  TYLENOL #3     oseltamivir 75 MG capsule  Commonly known as:  TAMIFLU     TYLENOL COLD MULTI-SYMPTOM DAY PO      TAKE these medications       butalbital-acetaminophen-caffeine 50-325-40 MG per tablet  Commonly known as:  FIORICET  Take 1-2 tablets by mouth every 6 (six) hours as needed for headache.     prenatal multivitamin Tabs tablet  Take 1 tablet by mouth daily at 12 noon.     pseudoephedrine 120 MG 12 hr tablet  Commonly known as:  SUDAFED  Take 120 mg by mouth daily as needed for congestion (For nasal congestion and sinus pressure.).       Cla[place], [place] 09/06/2013 7:42 AM

## 2013-09-06 NOTE — MAU Note (Signed)
Took a Tylenol #3 and one Tylenol cold and cough last night with little relief; has not taken any pain medicine  Or cold medicine today;

## 2013-09-06 NOTE — Discharge Instructions (Signed)

## 2013-09-06 NOTE — MAU Note (Signed)
C/o cold and headache for about 1.5 weeks;

## 2013-09-13 ENCOUNTER — Encounter (HOSPITAL_COMMUNITY): Payer: Self-pay | Admitting: *Deleted

## 2013-09-13 ENCOUNTER — Inpatient Hospital Stay (HOSPITAL_COMMUNITY)
Admission: AD | Admit: 2013-09-13 | Discharge: 2013-09-14 | Disposition: A | Discharge status: Home / Self Care | Payer: Medicaid Other | Source: Ambulatory Visit | Attending: Obstetrics | Admitting: Obstetrics

## 2013-09-13 DIAGNOSIS — R109 Unspecified abdominal pain: Secondary | ICD-10-CM | POA: Insufficient documentation

## 2013-09-13 DIAGNOSIS — Z3490 Encounter for supervision of normal pregnancy, unspecified, unspecified trimester: Secondary | ICD-10-CM

## 2013-09-13 DIAGNOSIS — Y92410 Unspecified street and highway as the place of occurrence of the external cause: Secondary | ICD-10-CM | POA: Insufficient documentation

## 2013-09-13 DIAGNOSIS — O4700 False labor before 37 completed weeks of gestation, unspecified trimester: Secondary | ICD-10-CM | POA: Insufficient documentation
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
## 2013-09-14 DIAGNOSIS — O9989 Other specified diseases and conditions complicating pregnancy, childbirth and the puerperium: Secondary | ICD-10-CM

## 2013-09-14 DIAGNOSIS — O99891 Other specified diseases and conditions complicating pregnancy: Secondary | ICD-10-CM

## 2013-09-14 DIAGNOSIS — R1084 Generalized abdominal pain: Secondary | ICD-10-CM

## 2013-09-14 NOTE — Discharge Instructions (Signed)
Abdominal Pain During Pregnancy °Abdominal pain is common in pregnancy. Most of the time, it does not cause harm. There are many causes of abdominal pain. Some causes are more serious than others. Some of the causes of abdominal pain in pregnancy are easily diagnosed. Occasionally, the diagnosis takes time to understand. Other times, the cause is not determined. Abdominal pain can be a sign that something is very wrong with the pregnancy, or the pain may have nothing to do with the pregnancy at all. For this reason, always tell your health care provider if you have any abdominal discomfort. °HOME CARE INSTRUCTIONS  °Monitor your abdominal pain for any changes. The following actions may help to alleviate any discomfort you are experiencing: °· Do not have sexual intercourse or put anything in your vagina until your symptoms go away completely. °· Get plenty of rest until your pain improves. °· Drink clear fluids if you feel nauseous. Avoid solid food as long as you are uncomfortable or nauseous. °· Only take over-the-counter or prescription medicine as directed by your health care provider. °· Keep all follow-up appointments with your health care provider. °SEEK IMMEDIATE MEDICAL CARE IF: °· You are bleeding, leaking fluid, or passing tissue from the vagina. °· You have increasing pain or cramping. °· You have persistent vomiting. °· You have painful or bloody urination. °· You have a fever. °· You notice a decrease in your baby's movements. °· You have extreme weakness or feel faint. °· You have shortness of breath, with or without abdominal pain. °· You develop a severe headache with abdominal pain. °· You have abnormal vaginal discharge with abdominal pain. °· You have persistent diarrhea. °· You have abdominal pain that continues even after rest, or gets worse. °MAKE SURE YOU:  °· Understand these instructions. °· Will watch your condition. °· Will get help right away if you are not doing well or get  worse. °Document Released: 08/08/2005 Document Revised: 05/29/2013 Document Reviewed: 03/07/2013 °ExitCare® Patient Information ©2014 [employer]. ° °

## 2013-10-22 LAB — OB RESULTS CONSOLE GBS: STREP GROUP B AG: NEGATIVE

## 2013-10-22 LAB — OB RESULTS CONSOLE GC/CHLAMYDIA
Chlamydia: NEGATIVE
Gonorrhea: NEGATIVE

## 2013-11-08 ENCOUNTER — Encounter (HOSPITAL_COMMUNITY): Payer: Self-pay

## 2013-11-08 ENCOUNTER — Inpatient Hospital Stay (HOSPITAL_COMMUNITY)
Admission: AD | Admit: 2013-11-08 | Discharge: 2013-11-08 | Disposition: A | Discharge status: Home / Self Care | Payer: Medicaid Other | Source: Ambulatory Visit | Attending: Obstetrics | Admitting: Obstetrics

## 2013-11-08 DIAGNOSIS — O479 False labor, unspecified: Secondary | ICD-10-CM | POA: Insufficient documentation

## 2013-11-08 NOTE — MAU Note (Signed)
PT SAYS   DENIES SROM, VAG BLEEDING, HSV, MRSA.  SAYS VE Tuesday  WAS 3 CM IN OFFICE.

## 2013-11-08 NOTE — Discharge Instructions (Signed)
Braxton Hicks Contractions Pregnancy is commonly associated with contractions of the uterus throughout the pregnancy. Towards the end of pregnancy (32 to 34 weeks), these contractions (Braxton Hicks) can develop more often and may become more forceful. This is not true labor because these contractions do not result in opening (dilatation) and thinning of the cervix. They are sometimes difficult to tell apart from true labor because these contractions can be forceful and people have different pain tolerances. You should not feel embarrassed if you go to the hospital with false labor. Sometimes, the only way to tell if you are in true labor is for your caregiver to follow the changes in the cervix. How to tell the difference between true and false labor:  False labor.  The contractions of false labor are usually shorter, irregular and not as hard as those of true labor.  They are often felt in the front of the lower abdomen and in the groin.  They may leave with walking around or changing positions while lying down.  They get weaker and are shorter lasting as time goes on.  These contractions are usually irregular.  They do not usually become progressively stronger, regular and closer together as with true labor.  True labor.  Contractions in true labor last 30 to 70 seconds, become very regular, usually become more intense, and increase in frequency.  They do not go away with walking.  The discomfort is usually felt in the top of the uterus and spreads to the lower abdomen and low back.  True labor can be determined by your caregiver with an exam. This will show that the cervix is dilating and getting thinner. If there are no prenatal problems or other health problems associated with the pregnancy, it is completely safe to be sent home with false labor and await the onset of true labor. HOME CARE INSTRUCTIONS   Keep up with your usual exercises and instructions.  Take medications as  directed.  Keep your regular prenatal appointment.  Eat and drink lightly if you think you are going into labor.  If BH contractions are making you uncomfortable:  Change your activity position from lying down or resting to walking/walking to resting.  Sit and rest in a tub of warm water.  Drink 2 to 3 glasses of water. Dehydration may cause B-H contractions.  Do slow and deep breathing several times an hour. SEEK IMMEDIATE MEDICAL CARE IF:   Your contractions continue to become stronger, more regular, and closer together.  You have a gushing, burst or leaking of fluid from the vagina.  An oral temperature above 102 F (38.9 C) develops.  You have passage of blood-tinged mucus.  You develop vaginal bleeding.  You develop continuous belly (abdominal) pain.  You have low back pain that you never had before.  You feel the baby's head pushing down causing pelvic pressure.  The baby is not moving as much as it used to. Document Released: 08/08/2005 Document Revised: 10/31/2011 Document Reviewed: 05/20/2013 ExitCare Patient Information 2014 [employer].  Fetal Movement Counts Patient Name: __________________________________________________ Patient Due Date: ____________________ Performing a fetal movement count is highly recommended in high-risk pregnancies, but it is good for every pregnant woman to do. Your caregiver may ask you to start counting fetal movements at 28 weeks of the pregnancy. Fetal movements often increase:  After eating a full meal.  After physical activity.  After eating or drinking something sweet or cold.  At rest. Pay attention to when you feel   the baby is most active. This will help you notice a pattern of your baby's sleep and wake cycles and what factors contribute to an increase in fetal movement. It is important to perform a fetal movement count at the same time each day when your baby is normally most active.  HOW TO COUNT FETAL  MOVEMENTS 1. Find a quiet and comfortable area to sit or lie down on your left side. Lying on your left side provides the best blood and oxygen circulation to your baby. 2. Write down the day and time on a sheet of paper or in a journal. 3. Start counting kicks, flutters, swishes, rolls, or jabs in a 2 hour period. You should feel at least 10 movements within 2 hours. 4. If you do not feel 10 movements in 2 hours, wait 2 3 hours and count again. Look for a change in the pattern or not enough counts in 2 hours. SEEK MEDICAL CARE IF:  You feel less than 10 counts in 2 hours, tried twice.  There is no movement in over an hour.  The pattern is changing or taking longer each day to reach 10 counts in 2 hours.  You feel the baby is not moving as he or she usually does. Date: ____________ Movements: ____________ Start time: ____________ Finish time: ____________  Date: ____________ Movements: ____________ Start time: ____________ Finish time: ____________ Date: ____________ Movements: ____________ Start time: ____________ Finish time: ____________ Date: ____________ Movements: ____________ Start time: ____________ Finish time: ____________ Date: ____________ Movements: ____________ Start time: ____________ Finish time: ____________ Date: ____________ Movements: ____________ Start time: ____________ Finish time: ____________ Date: ____________ Movements: ____________ Start time: ____________ Finish time: ____________ Date: ____________ Movements: ____________ Start time: ____________ Finish time: ____________  Date: ____________ Movements: ____________ Start time: ____________ Finish time: ____________ Date: ____________ Movements: ____________ Start time: ____________ Finish time: ____________ Date: ____________ Movements: ____________ Start time: ____________ Finish time: ____________ Date: ____________ Movements: ____________ Start time: ____________ Finish time: ____________ Date: ____________  Movements: ____________ Start time: ____________ Finish time: ____________ Date: ____________ Movements: ____________ Start time: ____________ Finish time: ____________ Date: ____________ Movements: ____________ Start time: ____________ Finish time: ____________  Date: ____________ Movements: ____________ Start time: ____________ Finish time: ____________ Date: ____________ Movements: ____________ Start time: ____________ Finish time: ____________ Date: ____________ Movements: ____________ Start time: ____________ Finish time: ____________ Date: ____________ Movements: ____________ Start time: ____________ Finish time: ____________ Date: ____________ Movements: ____________ Start time: ____________ Finish time: ____________ Date: ____________ Movements: ____________ Start time: ____________ Finish time: ____________ Date: ____________ Movements: ____________ Start time: ____________ Finish time: ____________  Date: ____________ Movements: ____________ Start time: ____________ Finish time: ____________ Date: ____________ Movements: ____________ Start time: ____________ Finish time: ____________ Date: ____________ Movements: ____________ Start time: ____________ Finish time: ____________ Date: ____________ Movements: ____________ Start time: ____________ Finish time: ____________ Date: ____________ Movements: ____________ Start time: ____________ Finish time: ____________ Date: ____________ Movements: ____________ Start time: ____________ Finish time: ____________ Date: ____________ Movements: ____________ Start time: ____________ Finish time: ____________  Date: ____________ Movements: ____________ Start time: ____________ Finish time: ____________ Date: ____________ Movements: ____________ Start time: ____________ Finish time: ____________ Date: ____________ Movements: ____________ Start time: ____________ Finish time: ____________ Date: ____________ Movements: ____________ Start time:  ____________ Finish time: ____________ Date: ____________ Movements: ____________ Start time: ____________ Finish time: ____________ Date: ____________ Movements: ____________ Start time: ____________ Finish time: ____________ Date: ____________ Movements: ____________ Start time: ____________ Finish time: ____________  Date: ____________ Movements: ____________ Start time: ____________ Finish time: ____________ Date: ____________ Movements: ____________ Start   time: ____________ Finish time: ____________ Date: ____________ Movements: ____________ Start time: ____________ Finish time: ____________ Date: ____________ Movements: ____________ Start time: ____________ Finish time: ____________ Date: ____________ Movements: ____________ Start time: ____________ Finish time: ____________ Date: ____________ Movements: ____________ Start time: ____________ Finish time: ____________ Date: ____________ Movements: ____________ Start time: ____________ Finish time: ____________  Date: ____________ Movements: ____________ Start time: ____________ Finish time: ____________ Date: ____________ Movements: ____________ Start time: ____________ Finish time: ____________ Date: ____________ Movements: ____________ Start time: ____________ Finish time: ____________ Date: ____________ Movements: ____________ Start time: ____________ Finish time: ____________ Date: ____________ Movements: ____________ Start time: ____________ Finish time: ____________ Date: ____________ Movements: ____________ Start time: ____________ Finish time: ____________ Date: ____________ Movements: ____________ Start time: ____________ Finish time: ____________  Date: ____________ Movements: ____________ Start time: ____________ Finish time: ____________ Date: ____________ Movements: ____________ Start time: ____________ Finish time: ____________ Date: ____________ Movements: ____________ Start time: ____________ Finish time: ____________ Date:  ____________ Movements: ____________ Start time: ____________ Finish time: ____________ Date: ____________ Movements: ____________ Start time: ____________ Finish time: ____________ Date: ____________ Movements: ____________ Start time: ____________ Finish time: ____________ Document Released: 09/07/2006 Document Revised: 07/25/2012 Document Reviewed: 06/04/2012 Exit[employer] Patient Information 2014 [employer].  

## 2013-11-12 ENCOUNTER — Encounter (HOSPITAL_COMMUNITY): Payer: Self-pay | Admitting: *Deleted

## 2013-11-12 ENCOUNTER — Telehealth (HOSPITAL_COMMUNITY): Payer: Self-pay | Admitting: *Deleted

## 2013-11-12 NOTE — Telephone Encounter (Signed)
Preadmission screen  

## 2013-11-14 ENCOUNTER — Inpatient Hospital Stay (HOSPITAL_COMMUNITY)
Admission: RE | Admit: 2013-11-14 | Discharge: 2013-11-16 | DRG: 775 | Disposition: A | Discharge status: Home / Self Care | Payer: Medicaid Other | Source: Ambulatory Visit | Attending: Obstetrics | Admitting: Obstetrics

## 2013-11-14 ENCOUNTER — Encounter (HOSPITAL_COMMUNITY): Payer: Medicaid Other | Admitting: Anesthesiology

## 2013-11-14 ENCOUNTER — Encounter (HOSPITAL_COMMUNITY): Payer: Self-pay

## 2013-11-14 ENCOUNTER — Inpatient Hospital Stay (HOSPITAL_COMMUNITY): Payer: Medicaid Other | Admitting: Anesthesiology

## 2013-11-14 DIAGNOSIS — D649 Anemia, unspecified: Secondary | ICD-10-CM | POA: Diagnosis present

## 2013-11-14 DIAGNOSIS — O9902 Anemia complicating childbirth: Principal | ICD-10-CM | POA: Diagnosis present

## 2013-11-14 LAB — RPR: RPR: NONREACTIVE

## 2013-11-14 LAB — CBC
HCT: 30.8 % — ABNORMAL LOW (ref 36.0–46.0)
Hemoglobin: 10.4 g/dL — ABNORMAL LOW (ref 12.0–15.0)
MCH: 27 pg (ref 26.0–34.0)
MCHC: 33.8 g/dL (ref 30.0–36.0)
MCV: 80 fL (ref 78.0–100.0)
PLATELETS: 222 10*3/uL (ref 150–400)
RBC: 3.85 MIL/uL — ABNORMAL LOW (ref 3.87–5.11)
RDW: 14 % (ref 11.5–15.5)
WBC: 5.9 10*3/uL (ref 4.0–10.5)

## 2013-11-14 MED ORDER — FLEET ENEMA 7-19 GM/118ML RE ENEM: 1.0000 | ENEMA | RECTAL | Status: DC | PRN

## 2013-11-14 MED ORDER — PHENYLEPHRINE 40 MCG/ML (10ML) SYRINGE FOR IV PUSH (FOR BLOOD PRESSURE SUPPORT)
PREFILLED_SYRINGE | INTRAVENOUS | Status: AC
  Filled 2013-11-14: qty 10

## 2013-11-14 MED ORDER — OXYTOCIN 40 UNITS IN LACTATED RINGERS INFUSION - SIMPLE MED: 62.5000 mL/h | INTRAVENOUS | Status: DC

## 2013-11-14 MED ORDER — ONDANSETRON HCL 4 MG/2ML IJ SOLN: 4.0000 mg | Freq: Four times a day (QID) | INTRAMUSCULAR | Status: DC | PRN

## 2013-11-14 MED ORDER — LIDOCAINE HCL (PF) 1 % IJ SOLN
30.0000 mL | INTRAMUSCULAR | Status: DC | PRN
  Filled 2013-11-14: qty 30

## 2013-11-14 MED ORDER — BENZOCAINE-MENTHOL 20-0.5 % EX AERO: 1.0000 "application " | INHALATION_SPRAY | CUTANEOUS | Status: DC | PRN

## 2013-11-14 MED ORDER — EPHEDRINE 5 MG/ML INJ
INTRAVENOUS | Status: AC
  Filled 2013-11-14: qty 4

## 2013-11-14 MED ORDER — WITCH HAZEL-GLYCERIN EX PADS: 1.0000 "application " | MEDICATED_PAD | CUTANEOUS | Status: DC | PRN

## 2013-11-14 MED ORDER — LACTATED RINGERS IV SOLN
INTRAVENOUS | Status: DC
  Administered 2013-11-14: 10:00:00 via INTRAVENOUS

## 2013-11-14 MED ORDER — TERBUTALINE SULFATE 1 MG/ML IJ SOLN: 0.2500 mg | Freq: Once | INTRAMUSCULAR | Status: DC | PRN

## 2013-11-14 MED ORDER — OXYTOCIN BOLUS FROM INFUSION: 500.0000 mL | INTRAVENOUS | Status: DC

## 2013-11-14 MED ORDER — OXYCODONE-ACETAMINOPHEN 5-325 MG PO TABS: 1.0000 | ORAL_TABLET | ORAL | Status: DC | PRN

## 2013-11-14 MED ORDER — FENTANYL 2.5 MCG/ML BUPIVACAINE 1/10 % EPIDURAL INFUSION (WH - ANES): 14.0000 mL/h | INTRAMUSCULAR | Status: DC | PRN

## 2013-11-14 MED ORDER — FENTANYL 2.5 MCG/ML BUPIVACAINE 1/10 % EPIDURAL INFUSION (WH - ANES)
INTRAMUSCULAR | Status: AC
  Filled 2013-11-14: qty 125

## 2013-11-14 MED ORDER — BUTORPHANOL TARTRATE 1 MG/ML IJ SOLN: 1.0000 mg | INTRAMUSCULAR | Status: DC | PRN

## 2013-11-14 MED ORDER — FERROUS SULFATE 325 (65 FE) MG PO TABS
325.0000 mg | ORAL_TABLET | Freq: Two times a day (BID) | ORAL | Status: DC
  Administered 2013-11-14 – 2013-11-16 (×4): 325 mg via ORAL
  Filled 2013-11-14 (×4): qty 1

## 2013-11-14 MED ORDER — PHENYLEPHRINE 40 MCG/ML (10ML) SYRINGE FOR IV PUSH (FOR BLOOD PRESSURE SUPPORT)
80.0000 ug | PREFILLED_SYRINGE | INTRAVENOUS | Status: DC | PRN
  Filled 2013-11-14: qty 2

## 2013-11-14 MED ORDER — FENTANYL 2.5 MCG/ML BUPIVACAINE 1/10 % EPIDURAL INFUSION (WH - ANES)
INTRAMUSCULAR | Status: DC | PRN
  Administered 2013-11-14: 14 mL/h via EPIDURAL

## 2013-11-14 MED ORDER — OXYCODONE-ACETAMINOPHEN 5-325 MG PO TABS
1.0000 | ORAL_TABLET | ORAL | Status: DC | PRN
  Administered 2013-11-15 (×3): 1 via ORAL
  Filled 2013-11-14 (×3): qty 1

## 2013-11-14 MED ORDER — IBUPROFEN 600 MG PO TABS: 600.0000 mg | ORAL_TABLET | Freq: Four times a day (QID) | ORAL | Status: DC | PRN

## 2013-11-14 MED ORDER — LANOLIN HYDROUS EX OINT: TOPICAL_OINTMENT | CUTANEOUS | Status: DC | PRN

## 2013-11-14 MED ORDER — ONDANSETRON HCL 4 MG/2ML IJ SOLN: 4.0000 mg | INTRAMUSCULAR | Status: DC | PRN

## 2013-11-14 MED ORDER — SIMETHICONE 80 MG PO CHEW
80.0000 mg | CHEWABLE_TABLET | ORAL | Status: DC | PRN
Start: 2013-11-14 — End: 2013-11-16

## 2013-11-14 MED ORDER — OXYTOCIN 40 UNITS IN LACTATED RINGERS INFUSION - SIMPLE MED
1.0000 m[IU]/min | INTRAVENOUS | Status: DC
  Administered 2013-11-14: 2 m[IU]/min via INTRAVENOUS
  Filled 2013-11-14: qty 1000

## 2013-11-14 MED ORDER — DIPHENHYDRAMINE HCL 25 MG PO CAPS: 25.0000 mg | ORAL_CAPSULE | Freq: Four times a day (QID) | ORAL | Status: DC | PRN

## 2013-11-14 MED ORDER — DIBUCAINE 1 % RE OINT: 1.0000 "application " | TOPICAL_OINTMENT | RECTAL | Status: DC | PRN

## 2013-11-14 MED ORDER — LACTATED RINGERS IV SOLN: 500.0000 mL | Freq: Once | INTRAVENOUS | Status: DC

## 2013-11-14 MED ORDER — ZOLPIDEM TARTRATE 5 MG PO TABS: 5.0000 mg | ORAL_TABLET | Freq: Every evening | ORAL | Status: DC | PRN

## 2013-11-14 MED ORDER — EPHEDRINE 5 MG/ML INJ
10.0000 mg | INTRAVENOUS | Status: DC | PRN
  Filled 2013-11-14: qty 2

## 2013-11-14 MED ORDER — PRENATAL MULTIVITAMIN CH
1.0000 | ORAL_TABLET | Freq: Every day | ORAL | Status: DC
  Administered 2013-11-15: 1 via ORAL
  Filled 2013-11-14: qty 1

## 2013-11-14 MED ORDER — ONDANSETRON HCL 4 MG PO TABS: 4.0000 mg | ORAL_TABLET | ORAL | Status: DC | PRN

## 2013-11-14 MED ORDER — INFLUENZA VAC SPLIT QUAD 0.5 ML IM SUSP
0.5000 mL | INTRAMUSCULAR | Status: AC
  Administered 2013-11-15: 0.5 mL via INTRAMUSCULAR
  Filled 2013-11-14: qty 0.5

## 2013-11-14 MED ORDER — CITRIC ACID-SODIUM CITRATE 334-500 MG/5ML PO SOLN: 30.0000 mL | ORAL | Status: DC | PRN

## 2013-11-14 MED ORDER — DIPHENHYDRAMINE HCL 50 MG/ML IJ SOLN: 12.5000 mg | INTRAMUSCULAR | Status: DC | PRN

## 2013-11-14 MED ORDER — TETANUS-DIPHTH-ACELL PERTUSSIS 5-2.5-18.5 LF-MCG/0.5 IM SUSP
0.5000 mL | Freq: Once | INTRAMUSCULAR | Status: AC
  Administered 2013-11-15: 0.5 mL via INTRAMUSCULAR
  Filled 2013-11-14: qty 0.5

## 2013-11-14 MED ORDER — SENNOSIDES-DOCUSATE SODIUM 8.6-50 MG PO TABS
2.0000 | ORAL_TABLET | ORAL | Status: DC
  Administered 2013-11-14 – 2013-11-15 (×2): 2 via ORAL
  Filled 2013-11-14 (×2): qty 2

## 2013-11-14 MED ORDER — LIDOCAINE HCL (PF) 1 % IJ SOLN
INTRAMUSCULAR | Status: DC | PRN
  Administered 2013-11-14 (×2): 4 mL

## 2013-11-14 MED ORDER — ACETAMINOPHEN 325 MG PO TABS: 650.0000 mg | ORAL_TABLET | ORAL | Status: DC | PRN

## 2013-11-14 MED ORDER — LACTATED RINGERS IV SOLN: 500.0000 mL | INTRAVENOUS | Status: DC | PRN

## 2013-11-14 MED ORDER — IBUPROFEN 600 MG PO TABS
600.0000 mg | ORAL_TABLET | Freq: Four times a day (QID) | ORAL | Status: DC
  Administered 2013-11-14 – 2013-11-16 (×7): 600 mg via ORAL
  Filled 2013-11-14 (×7): qty 1

## 2013-11-14 NOTE — H&P (Signed)
This is Dr. Gracy Racer dictating the history and physical on  [name] she's a 28 year old gravida 5 para 1-13 at 51 weeks her EDC is 11/20/2013 to 4-15 negative GBS and she desires induction she is now has an epidural and she is 4 cm 70-80% vertex -3 amniotomy performed the fluids clear Past medical history negative Past surgical history negative Social history negative System review noncontributory Physical exam well-developed female in labor HEENT negative Lungs clear to P&A Heart regular rhythm no murmurs no gallops Breasts negative Abdomen term Pelvic as described above Extremities negative and

## 2013-11-14 NOTE — Anesthesia Procedure Notes (Signed)
Epidural Patient location during procedure: OB Start time: 11/14/2013 9:49 AM  Staffing Anesthesiologist: Caden Fukushima A. Performed by: anesthesiologist   Preanesthetic Checklist Completed: patient identified, site marked, surgical consent, pre-op evaluation, timeout performed, IV checked, risks and benefits discussed and monitors and equipment checked  Epidural Patient position: sitting Prep: site prepped and draped and DuraPrep Patient monitoring: continuous pulse ox and blood pressure Approach: midline Location: L3-L4 Injection technique: LOR air  Needle:  Needle type: Tuohy  Needle gauge: 17 G Needle length: 9 cm and 9 Needle insertion depth: 6 cm Catheter type: closed end flexible Catheter size: 19 Gauge Catheter at skin depth: 11 cm Test dose: negative and Other  Assessment Events: blood not aspirated, injection not painful, no injection resistance, negative IV test and no paresthesia  Additional Notes Patient identified. Risks and benefits discussed including failed block, incomplete  Pain control, post dural puncture headache, nerve damage, paralysis, blood pressure Changes, nausea, vomiting, reactions to medications-both toxic and allergic and post Partum back pain. All questions were answered. Patient expressed understanding and wished to proceed. Sterile technique was used throughout procedure. Epidural site was Dressed with sterile barrier dressing. No paresthesias, signs of intravascular injection Or signs of intrathecal spread were encountered.  Patient was more comfortable after the epidural was dosed. Please see RN's note for documentation of vital signs and FHR which are stable.

## 2013-11-14 NOTE — Lactation Note (Signed)
This note was copied from the chart of [name]. Lactation Consultation Note Baby STS, just completed bath. Baby alert and sucking on fist. Assisted in football position, Hand expression demonstrated w/colostrum noted. Baby latched well w/feeding tech. Teaching. Referred to Baby and Me Book in Breastfeeding section Pg. 22-23 for position options and Proper latch demonstration.Encouraged comfort during BF so colostrum flows better and mom will enjoy the feeding longer. Taking deep breaths and breast massage during BF. Encouraged to call for assistance if needed and to verify proper latch. Grandmother at bedside, experienced breast feeder of 3 of her children. Mom has good breast anatomy, taught finger roll to nipples to stimulate and importance of deep latch.  Patient Name: [name] Today's Date: 11/14/2013 Reason for consult: Initial assessment   Maternal Data Formula Feeding for Exclusion: Yes Reason for exclusion: Mother's choice to formula and breast feed on admission Infant to breast within first hour of birth: Yes Has patient been taught Hand Expression?: Yes Does the patient have breastfeeding experience prior to this delivery?: No  Feeding Feeding Type: Breast Fed Length of feed: 15 min (still feeding when left)  LATCH Score/Interventions Latch: Grasps breast easily, tongue down, lips flanged, rhythmical sucking. Intervention(s): Adjust position;Assist with latch;Breast massage;Breast compression  Audible Swallowing: None Intervention(s): Skin to skin;Hand expression Intervention(s): Skin to skin;Hand expression  Type of Nipple: Everted at rest and after stimulation  Comfort (Breast/Nipple): Soft / non-tender     Hold (Positioning): Assistance needed to correctly position infant at breast and maintain latch. Intervention(s): Breastfeeding basics reviewed;Support Pillows;Position options;Skin to skin  LATCH Score: 7  Lactation Tools Discussed/Used     Consult Status Consult Status: Follow-up Date: 11/15/13 Follow-up type: In-patient    Theodoro Kalata 11/14/2013, 9:32 PM

## 2013-11-14 NOTE — Anesthesia Preprocedure Evaluation (Signed)
Anesthesia Evaluation  Patient identified by MRN, date of birth, ID band Patient awake    Reviewed: Allergy & Precautions, H&P , Patient's Chart, lab work & pertinent test results  Airway Mallampati: III TM Distance: >3 FB Neck ROM: Full    Dental no notable dental hx. (+) Teeth Intact   Pulmonary  Hx/o TB breath sounds clear to auscultation  Pulmonary exam normal       Cardiovascular negative cardio ROS  Rhythm:Regular Rate:Normal     Neuro/Psych negative neurological ROS  negative psych ROS   GI/Hepatic negative GI ROS, Neg liver ROS,   Endo/Other  negative endocrine ROS  Renal/GU negative Renal ROS  negative genitourinary   Musculoskeletal negative musculoskeletal ROS (+)   Abdominal   Peds  Hematology  (+) anemia ,   Anesthesia Other Findings   Reproductive/Obstetrics (+) Pregnancy                           Anesthesia Physical Anesthesia Plan  ASA: II  Anesthesia Plan: Epidural   Post-op Pain Management:    Induction:   Airway Management Planned: Natural Airway  Additional Equipment:   Intra-op Plan:   Post-operative Plan:   Informed Consent: I have reviewed the patients History and Physical, chart, labs and discussed the procedure including the risks, benefits and alternatives for the proposed anesthesia with the patient or authorized representative who has indicated his/her understanding and acceptance.     Plan Discussed with: Anesthesiologist  Anesthesia Plan Comments:         Anesthesia Quick Evaluation

## 2013-11-15 LAB — CBC
HCT: 29.2 % — ABNORMAL LOW (ref 36.0–46.0)
Hemoglobin: 9.9 g/dL — ABNORMAL LOW (ref 12.0–15.0)
MCH: 27.3 pg (ref 26.0–34.0)
MCHC: 33.9 g/dL (ref 30.0–36.0)
MCV: 80.7 fL (ref 78.0–100.0)
PLATELETS: 189 10*3/uL (ref 150–400)
RBC: 3.62 MIL/uL — ABNORMAL LOW (ref 3.87–5.11)
RDW: 14.1 % (ref 11.5–15.5)
WBC: 10.2 10*3/uL (ref 4.0–10.5)

## 2013-11-15 NOTE — Progress Notes (Signed)
CSW attempted twice to meet with MOB to complete assessment for hx of depression, but the first time the baby was having her pictures done and the second time MOB was sleeping.  CSW will attempt again at a later time.

## 2013-11-15 NOTE — Anesthesia Postprocedure Evaluation (Signed)
  Anesthesia Post-op Note  Patient: [name]  Procedure(s) Performed: * No procedures listed *  Patient Location: Mother/Baby  Anesthesia Type:Epidural  Level of Consciousness: awake and alert   Airway and Oxygen Therapy: Patient Spontanous Breathing  Post-op Pain: mild  Post-op Assessment: Patient's Cardiovascular Status Stable, Respiratory Function Stable, No signs of Nausea or vomiting, Pain level controlled, No headache, No residual numbness and No residual motor weakness  Post-op Vital Signs: stable  Complications: No apparent anesthesia complications

## 2013-11-15 NOTE — Progress Notes (Signed)
UR chart review completed.  

## 2013-11-15 NOTE — Progress Notes (Signed)
Patient ID: [name], female   DOB: 12/26/1985, 28 y.o.   MRN: [id number] Postpartum postpartum day one Vital signs normal fundus firm Lochia moderate Legs negative doing well and and

## 2013-11-15 NOTE — Progress Notes (Signed)
CSW attempted for the third time to meet with MOB to complete assessment for hx of Depression.  RN states she is sleeping.  RN notes no concerns with Depression on her shift.  CSW will attempt again to meet with patient at a later time or leave report for weekend CSW to meet with her if possible.

## 2013-11-15 NOTE — Lactation Note (Signed)
This note was copied from the chart of Julia Jillaine Waren. Lactation Consultation Note Follow up visit at 30 hours of age.  Baby has had 12 feeding in the past 24 hours with 3 voids and 2 stools.  Encouraged mom to hand express prior to latch to ease initial discomfort with latch on and after to rub into nipple.  Mom denies any other problems.  She has questions about when to start pumping.  Discussed at length how pumping/supplementing can affect supply and when to pump.  Encouraged mom to call for assist as needed.   Patient Name: [name] [id number] Date: 11/15/2013 Reason for consult: Follow-up assessment   Maternal Data    Feeding Feeding Type: Breast Fed Length of feed: 27 min  LATCH Score/Interventions                Intervention(s): Breastfeeding basics reviewed     Lactation Tools Discussed/Used     Consult Status Consult Status: Follow-up Date: 11/16/13 Follow-up type: In-patient    Shoptaw, Justine Null 11/15/2013, 9:25 PM

## 2013-11-16 MED ORDER — IBUPROFEN 600 MG PO TABS: 600.0000 mg | ORAL_TABLET | Freq: Four times a day (QID) | ORAL | Status: DC | PRN

## 2013-11-16 MED ORDER — OXYCODONE-ACETAMINOPHEN 5-325 MG PO TABS: 1.0000 | ORAL_TABLET | Freq: Four times a day (QID) | ORAL | Status: DC | PRN

## 2013-11-16 NOTE — Progress Notes (Signed)
Post Partum Day 2 Subjective: no complaints  Objective: Blood pressure 107/65, pulse 68, temperature 97.9 F (36.6 C), temperature source Oral, resp. rate 18, height 5\' 3"  (1.6 m), weight 161 lb (73.029 kg), last menstrual period 02/11/2013, SpO2 100.00%, unknown if currently breastfeeding.  Physical Exam:  General: alert and no distress Lochia: appropriate Uterine Fundus: firm Incision: none DVT Evaluation: No evidence of DVT seen on physical exam.   Recent Labs  11/14/13 0745 11/15/13 0614  HGB 10.4* 9.9*  HCT 30.8* 29.2*    Assessment/Plan: Discharge home   LOS: 2 days   Eulas Schweitzer A 11/16/2013, 7:12 AM

## 2013-11-16 NOTE — Discharge Instructions (Signed)
Before [facility] Ask any questions about feeding, diapering, and baby care before you leave the hospital. Ask again if you do not understand. Ask when you need to see the doctor again. There are several things you must have before your baby comes home.  Infant car seat.  Crib.  Do not let your baby sleep in a bed with you or anyone else.  If you do not have a bed for your baby, ask the doctor what you can use that will be safe for the baby to sleep in. Infant feeding supplies:  6 to 8 bottles (8 oz. size).  6 to 8 nipples.  Measuring cup.  Measuring tablespoon.  Bottle brush.  Sterilizer (or use any large pan or kettle with a lid).  Formula that contains iron.  A way to boil and cool water. Breastfeeding supplies:  Breast pump.  Nipple cream. Clothing:  24 to 36 cloth diapers and waterproof diaper covers or a box of disposable diapers. You may need as many as 10 to 12 diapers per day.  3 onesies (other clothing will depend on the time of year and the weather).  3 receiving blankets.  3 baby pajamas or gowns.  3 bibs. Bath equipment:  Mild soap.  Petroleum jelly. No baby oil or powder.  Soft cloth towel and wash cloth.  Cotton balls.  Separate bath basin for baby. Only sponge bathe until umbilical cord and circumcision are healed. Other supplies:  Thermometer and bulb syringe (ask the hospital to send them home with you). Ask your doctor about how you should take your baby's temperature.  One to two pacifiers. Prepare for an emergency:  Know how to get to the hospital and know where to admit your baby.  Put all doctor numbers near your house phone and in your cell phone if you have one. Prepare your family:  Talk with siblings about the baby coming home and how they feel about it.  Decide how you want to handle visitors and other family members.  Take offers for help with the baby. You will need time to adjust. Know when to call the doctor.   GET HELP RIGHT AWAY IF:  Your baby's temperature is greater than 100.4 F (38 C).  The softspot on your baby's head starts to bulge.  Your baby is crying with no tears or has no wet diapers for 6 hours.  Your baby has rapid breathing.  Your baby is not as alert. Document Released: 07/21/2008 Document Revised: 10/31/2011 Document Reviewed: 10/28/2010 [facility] Patient Information 2014 [place], [place].  Breastfeeding Deciding to breastfeed is one of the best choices you can make for you and your baby. A change in hormones during pregnancy causes your breast tissue to grow and increases the number and size of your milk ducts. These hormones also allow proteins, sugars, and fats from your blood supply to make breast milk in your milk-producing glands. Hormones prevent breast milk from being released before your baby is born as well as prompt milk flow after birth. Once breastfeeding has begun, thoughts of your baby, as well as his or her sucking or crying, can stimulate the release of milk from your milk-producing glands.  BENEFITS OF BREASTFEEDING For Your Baby  Your first milk (colostrum) helps your baby's digestive system function better.   There are antibodies in your milk that help your baby fight off infections.   Your baby has a lower incidence of asthma, allergies, and sudden infant death syndrome.  The nutrients in breast milk are better for your baby than infant formulas and are designed uniquely for your baby's needs.   Breast milk improves your baby's brain development.   Your baby is less likely to develop other conditions, such as childhood obesity, asthma, or type 2 diabetes mellitus.  For You   Breastfeeding helps to create a very special bond between you and your baby.   Breastfeeding is convenient. Breast milk is always available at the correct temperature and costs nothing.   Breastfeeding helps to burn calories and helps you lose the weight gained during  pregnancy.   Breastfeeding makes your uterus contract to its prepregnancy size faster and slows bleeding (lochia) after you give birth.   Breastfeeding helps to lower your risk of developing type 2 diabetes mellitus, osteoporosis, and breast or ovarian cancer later in life. SIGNS THAT YOUR BABY IS HUNGRY Early Signs of Hunger  Increased alertness or activity.  Stretching.  Movement of the head from side to side.  Movement of the head and opening of the mouth when the corner of the mouth or cheek is stroked (rooting).  Increased sucking sounds, smacking lips, cooing, sighing, or squeaking.  Hand-to-mouth movements.  Increased sucking of fingers or hands. Late Signs of Hunger  Fussing.  Intermittent crying. Extreme Signs of Hunger Signs of extreme hunger will require calming and consoling before your baby will be able to breastfeed successfully. Do not wait for the following signs of extreme hunger to occur before you initiate breastfeeding:   Restlessness.  A loud, strong cry.   Screaming. BREASTFEEDING BASICS Breastfeeding Initiation  Find a comfortable place to sit or lie down, with your neck and back well supported.  Place a pillow or rolled up blanket under your baby to bring him or her to the level of your breast (if you are seated). Nursing pillows are specially designed to help support your arms and your baby while you breastfeed.  Make sure that your baby's abdomen is facing your abdomen.   Gently massage your breast. With your fingertips, massage from your chest wall toward your nipple in a circular motion. This encourages milk flow. You may need to continue this action during the feeding if your milk flows slowly.  Support your breast with 4 fingers underneath and your thumb above your nipple. Make sure your fingers are well away from your nipple and your baby's mouth.   Stroke your baby's lips gently with your finger or nipple.   When your baby's  mouth is open wide enough, quickly bring your baby to your breast, placing your entire nipple and as much of the colored area around your nipple (areola) as possible into your baby's mouth.   More areola should be visible above your baby's upper lip than below the lower lip.   Your baby's tongue should be between his or her lower gum and your breast.   Ensure that your baby's mouth is correctly positioned around your nipple (latched). Your baby's lips should create a seal on your breast and be turned out (everted).  It is common for your baby to suck about 2 3 minutes in order to start the flow of breast milk. Latching Teaching your baby how to latch on to your breast properly is very important. An improper latch can cause nipple pain and decreased milk supply for you and poor weight gain in your baby. Also, if your baby is not latched onto your nipple properly, he or she may swallow some  air during feeding. This can make your baby fussy. Burping your baby when you switch breasts during the feeding can help to get rid of the air. However, teaching your baby to latch on properly is still the best way to prevent fussiness from swallowing air while breastfeeding. Signs that your baby has successfully latched on to your nipple:    Silent tugging or silent sucking, without causing you pain.   Swallowing heard between every 3 4 sucks.    Muscle movement above and in front of his or her ears while sucking.  Signs that your baby has not successfully latched on to nipple:   Sucking sounds or smacking sounds from your baby while breastfeeding.  Nipple pain. If you think your baby has not latched on correctly, slip your finger into the corner of your baby's mouth to break the suction and place it between your baby's gums. Attempt breastfeeding initiation again. Signs of Successful Breastfeeding Signs from your baby:   A gradual decrease in the number of sucks or complete cessation of sucking.    Falling asleep.   Relaxation of his or her body.   Retention of a small amount of milk in his or her mouth.   Letting go of your breast by himself or herself. Signs from you:  Breasts that have increased in firmness, weight, and size 1 3 hours after feeding.   Breasts that are softer immediately after breastfeeding.  Increased milk volume, as well as a change in milk consistency and color by the 5th day of breastfeeding.   Nipples that are not sore, cracked, or bleeding. Signs That Your Randel Books is Getting Enough Milk  Wetting at least 3 diapers in a 24-hour period. The urine should be clear and pale yellow by age 62 days.  At least 3 stools in a 24-hour period by age 62 days. The stool should be soft and yellow.  At least 3 stools in a 24-hour period by age 53 days. The stool should be seedy and yellow.  No loss of weight greater than 10% of birth weight during the first 50 days of age.  Average weight gain of 4 7 ounces (120 210 mL) per week after age 12 days.  Consistent daily weight gain by age 121 days, without weight loss after the age of 2 weeks. After a feeding, your baby may spit up a small amount. This is common. BREASTFEEDING FREQUENCY AND DURATION Frequent feeding will help you make more milk and can prevent sore nipples and breast engorgement. Breastfeed when you feel the need to reduce the fullness of your breasts or when your baby shows signs of hunger. This is called "breastfeeding on demand." Avoid introducing a pacifier to your baby while you are working to establish breastfeeding (the first 4 6 weeks after your baby is born). After this time you may choose to use a pacifier. Research has shown that pacifier use during the first year of a baby's life decreases the risk of sudden infant death syndrome (SIDS). Allow your baby to feed on each breast as long as he or she wants. Breastfeed until your baby is finished feeding. When your baby unlatches or falls asleep while  feeding from the first breast, offer the second breast. Because newborns are often sleepy in the first few weeks of life, you may need to awaken your baby to get him or her to feed. Breastfeeding times will vary from baby to baby. However, the following rules can serve as a guide  to help you ensure that your baby is properly fed: °· Newborns (babies 4 weeks of age or younger) may breastfeed every 1 3 hours. °· Newborns should not go longer than 3 hours during the day or 5 hours during the night without breastfeeding. °· You should breastfeed your baby a minimum of 8 times in a 24-hour period until you begin to introduce solid foods to your baby at around 6 months of age. °BREAST MILK PUMPING °Pumping and storing breast milk allows you to ensure that your baby is exclusively fed your breast milk, even at times when you are unable to breastfeed. This is especially important if you are going back to work while you are still breastfeeding or when you are not able to be present during feedings. Your lactation consultant can give you guidelines on how long it is safe to store breast milk.  °A breast pump is a machine that allows you to pump milk from your breast into a sterile bottle. The pumped breast milk can then be stored in a refrigerator or freezer. Some breast pumps are operated by hand, while others use electricity. Ask your lactation consultant which type will work best for you. Breast pumps can be purchased, but some hospitals and breastfeeding support groups lease breast pumps on a monthly basis. A lactation consultant can teach you how to hand express breast milk, if you prefer not to use a pump.  °CARING FOR YOUR BREASTS WHILE YOU BREASTFEED °Nipples can become dry, cracked, and sore while breastfeeding. The following recommendations can help keep your breasts moisturized and healthy: °· Avoid using soap on your nipples.   °· Wear a supportive bra. Although not required, special nursing bras and tank tops  are designed to allow access to your breasts for breastfeeding without taking off your entire bra or top. Avoid wearing underwire style bras or extremely tight bras. °· Air dry your nipples for 3 4 minutes after each feeding.   °· Use only cotton bra pads to absorb leaked breast milk. Leaking of breast milk between feedings is normal.   °· Use lanolin on your nipples after breastfeeding. Lanolin helps to maintain your skin's normal moisture barrier. If you use pure lanolin you do not need to wash it off before feeding your baby again. Pure lanolin is not toxic to your baby. You may also hand express a few drops of breast milk and gently massage that milk into your nipples and allow the milk to air dry. °In the first few weeks after giving birth, some women experience extremely full breasts (engorgement). Engorgement can make your breasts feel heavy, warm, and tender to the touch. Engorgement peaks within 3 5 days after you give birth. The following recommendations can help ease engorgement: °· Completely empty your breasts while breastfeeding or pumping. You may want to start by applying warm, moist heat (in the shower or with warm water-soaked hand towels) just before feeding or pumping. This increases circulation and helps the milk flow. If your baby does not completely empty your breasts while breastfeeding, pump any extra milk after he or she is finished. °· Wear a snug bra (nursing or regular) or tank top for 1 2 days to signal your body to slightly decrease milk production. °· Apply ice packs to your breasts, unless this is too uncomfortable for you. °· Make sure that your baby is latched on and positioned properly while breastfeeding. °If engorgement persists after 48 hours of following these recommendations, contact your health care provider or a lactation   Optometrist. OVERALL HEALTH CARE RECOMMENDATIONS WHILE BREASTFEEDING  Eat healthy foods. Alternate between meals and snacks, eating 3 of each per day.  Because what you eat affects your breast milk, some of the foods may make your baby more irritable than usual. Avoid eating these foods if you are sure that they are negatively affecting your baby.  Drink milk, fruit juice, and water to satisfy your thirst (about 10 glasses a day).   Rest often, relax, and continue to take your prenatal vitamins to prevent fatigue, stress, and anemia.  Continue breast self-awareness checks.  Avoid chewing and smoking tobacco.  Avoid alcohol and drug use. Some medicines that may be harmful to your baby can pass through breast milk. It is important to ask your health care provider before taking any medicine, including all over-the-counter and prescription medicine as well as vitamin and herbal supplements. It is possible to become pregnant while breastfeeding. If birth control is desired, ask your health care provider about options that will be safe for your baby. SEEK MEDICAL CARE IF:   You feel like you want to stop breastfeeding or have become frustrated with breastfeeding.  You have painful breasts or nipples.  Your nipples are cracked or bleeding.  Your breasts are red, tender, or warm.  You have a swollen area on either breast.  You have a fever or chills.  You have nausea or vomiting.  You have drainage other than breast milk from your nipples.  Your breasts do not become full before feedings by the 5th day after you give birth.  You feel sad and depressed.  Your baby is too sleepy to eat well.  Your baby is having trouble sleeping.   Your baby is wetting less than 3 diapers in a 24-hour period.  Your baby has less than 3 stools in a 24-hour period.  Your baby's skin or the white part of his or her eyes becomes yellow.   Your baby is not gaining weight by 73 days of age. SEEK IMMEDIATE MEDICAL CARE IF:   Your baby is overly tired (lethargic) and does not want to wake up and feed.  Your baby develops an unexplained  fever. Document Released: 08/08/2005 Document Revised: 04/10/2013 Document Reviewed: 01/30/2013 [facility] Patient Information 2014 [place].

## 2013-11-16 NOTE — Discharge Summary (Signed)
Obstetric Discharge Summary Reason for Admission: induction of labor Prenatal Procedures: ultrasound Intrapartum Procedures: spontaneous vaginal delivery Postpartum Procedures: none Complications-Operative and Postpartum: none Hemoglobin  Date Value Ref Range Status  11/15/2013 9.9* 12.0 - 15.0 g/dL Final     HCT  Date Value Ref Range Status  11/15/2013 29.2* 36.0 - 46.0 % Final    Physical Exam:  General: alert and no distress Lochia: appropriate Uterine Fundus: firm Incision: none DVT Evaluation: No evidence of DVT seen on physical exam.  Discharge Diagnoses: Term Pregnancy-delivered  Discharge Information: Date: 11/16/2013 Activity: pelvic rest Diet: routine Medications: PNV, Ibuprofen, Colace and Percocet Condition: stable Instructions: refer to practice specific booklet Discharge to: home Follow-up Information   Follow up with MARSHALL,BERNARD A, MD In 6 weeks.   Specialty:  Obstetrics and Gynecology   Contact information:   [address] [address] [address] [place] [address] [phone]       Newborn Data: Live born female  Birth Weight: 6 lb 0.1 oz (2724 g) APGAR: 9, 9  Home with mother.  HARPER,CHARLES A 11/16/2013, 7:15 AM

## 2014-05-26 ENCOUNTER — Inpatient Hospital Stay (HOSPITAL_COMMUNITY)
Admission: AD | Admit: 2014-05-26 | Discharge: 2014-05-26 | Disposition: A | Discharge status: Home / Self Care | Payer: Medicaid Other | Source: Ambulatory Visit | Attending: Obstetrics & Gynecology | Admitting: Obstetrics & Gynecology

## 2014-05-26 ENCOUNTER — Encounter (HOSPITAL_COMMUNITY): Payer: Self-pay | Admitting: *Deleted

## 2014-05-26 DIAGNOSIS — R0602 Shortness of breath: Secondary | ICD-10-CM | POA: Insufficient documentation

## 2014-05-26 DIAGNOSIS — N898 Other specified noninflammatory disorders of vagina: Secondary | ICD-10-CM | POA: Insufficient documentation

## 2014-05-26 DIAGNOSIS — R002 Palpitations: Secondary | ICD-10-CM | POA: Insufficient documentation

## 2014-05-26 DIAGNOSIS — M549 Dorsalgia, unspecified: Secondary | ICD-10-CM

## 2014-05-26 DIAGNOSIS — O99891 Other specified diseases and conditions complicating pregnancy: Secondary | ICD-10-CM

## 2014-05-26 DIAGNOSIS — M545 Low back pain: Secondary | ICD-10-CM | POA: Insufficient documentation

## 2014-05-26 DIAGNOSIS — O9989 Other specified diseases and conditions complicating pregnancy, childbirth and the puerperium: Secondary | ICD-10-CM | POA: Insufficient documentation

## 2014-05-26 LAB — CBC WITH DIFFERENTIAL/PLATELET
BASOS ABS: 0 10*3/uL (ref 0.0–0.1)
BASOS PCT: 0 % (ref 0–1)
Eosinophils Absolute: 0.1 10*3/uL (ref 0.0–0.7)
Eosinophils Relative: 2 % (ref 0–5)
HEMATOCRIT: 32.3 % — AB (ref 36.0–46.0)
HEMOGLOBIN: 11.5 g/dL — AB (ref 12.0–15.0)
Lymphocytes Relative: 39 % (ref 12–46)
Lymphs Abs: 2 10*3/uL (ref 0.7–4.0)
MCH: 29.8 pg (ref 26.0–34.0)
MCHC: 35.6 g/dL (ref 30.0–36.0)
MCV: 83.7 fL (ref 78.0–100.0)
MONOS PCT: 10 % (ref 3–12)
Monocytes Absolute: 0.5 10*3/uL (ref 0.1–1.0)
NEUTROS ABS: 2.4 10*3/uL (ref 1.7–7.7)
NEUTROS PCT: 49 % (ref 43–77)
Platelets: 217 10*3/uL (ref 150–400)
RBC: 3.86 MIL/uL — ABNORMAL LOW (ref 3.87–5.11)
RDW: 12.8 % (ref 11.5–15.5)
WBC: 5 10*3/uL (ref 4.0–10.5)

## 2014-05-26 LAB — WET PREP, GENITAL
Trich, Wet Prep: NONE SEEN
YEAST WET PREP: NONE SEEN

## 2014-05-26 LAB — RAPID HIV SCREEN (WH-MAU): SUDS RAPID HIV SCREEN: NONREACTIVE

## 2014-05-26 LAB — URINALYSIS, ROUTINE W REFLEX MICROSCOPIC
Bilirubin Urine: NEGATIVE
Glucose, UA: NEGATIVE mg/dL
HGB URINE DIPSTICK: NEGATIVE
Ketones, ur: NEGATIVE mg/dL
Leukocytes, UA: NEGATIVE
Nitrite: NEGATIVE
Protein, ur: NEGATIVE mg/dL
SPECIFIC GRAVITY, URINE: 1.015 (ref 1.005–1.030)
Urobilinogen, UA: 0.2 mg/dL (ref 0.0–1.0)
pH: 8.5 — ABNORMAL HIGH (ref 5.0–8.0)

## 2014-05-26 LAB — POCT PREGNANCY, URINE: PREG TEST UR: POSITIVE — AB

## 2014-05-26 NOTE — MAU Provider Note (Signed)
History     CSN: [id number]  Arrival date and time: 05/26/14 1048   None     Chief Complaint  Patient presents with  . Back Pain  . Shortness of Breath   HPI 28 yo [id number] @ 10+4 by unsure LMP, s/p induced vaginal delivery 10/2013, presents with a list of complaints.  Most concerning is "heart problem." More tired than usual, gets out of breath more quickly than normal. No chest pain but does experience palpitations with exertion. Happens every time exerting self. Going on since delivery in March. 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 Had this problem with each of prior pregnancies but says not as severe as this time around. Never been evaluated by a physician. Mild feet swelling. Able to lie flat at night. No symptoms currently. Has not yet established prenatal care. NO neck swelling. No diarrhea, no skin or nail changes.  Concerned about back pain. Diffuse. Does not radiate. Sometimes radiates to mid-calf of left leg that she describes as a numb pain. No weakness, no distal numbness. No recent trauma. No dysuria, no urinary frequency, flank pain. No loss of bowel or bladder.  Also concerned about vaginal discharge. Present for 2 months. No recent worsening. White/clear colored. Thinks more odorous than previous discharge. No bleeding or leakage of fluid. No burning or itching. Two sexual partners (men) in the past year. History gonorrhea/chlamydia each once. No abdominal pain, no vaginal bleeding.  Cites a lot of recent stress and becomes tearful when discussing. Trouble balancing work and home demands. Some family upset she is pregnant again so quickly.  OB History   Grav Para Term Preterm Abortions TAB SAB Ect Mult Living   6 4 2 2 1  1   4       Past Medical History  Diagnosis Date  . Tuberculosis   . Anemia   . Hx of gonorrhea   . Hx of chlamydia infection     Past Surgical History  Procedure Laterality Date  . No past surgeries       Family History  Problem Relation Age of Onset  . Hypertension Mother   . Diabetes Father     History  Substance Use Topics  . Smoking status: Never Smoker   . Smokeless tobacco: Never Used  . Alcohol Use: No    Allergies: No Known Allergies  Prescriptions prior to admission  Medication Sig Dispense Refill  . Prenatal Vit-Fe Fumarate-FA (PRENATAL MULTIVITAMIN) TABS tablet Take 1 tablet by mouth daily at 12 noon.       Results for orders placed during the hospital encounter of 05/26/14 (from the past 48 hour(s))  URINALYSIS, ROUTINE W REFLEX MICROSCOPIC     Status: Abnormal   Collection Time    05/26/14 10:55 AM      Result Value Ref Range   Color, Urine YELLOW  YELLOW   APPearance CLEAR  CLEAR   Specific Gravity, Urine 1.015  1.005 - 1.030   pH 8.5 (*) 5.0 - 8.0   Glucose, UA NEGATIVE  NEGATIVE mg/dL   Hgb urine dipstick NEGATIVE  NEGATIVE   Bilirubin Urine NEGATIVE  NEGATIVE   Ketones, ur NEGATIVE  NEGATIVE mg/dL   Protein, ur NEGATIVE  NEGATIVE mg/dL   Urobilinogen, UA 0.2  0.0 - 1.0 mg/dL   Nitrite NEGATIVE  NEGATIVE   Leukocytes, UA NEGATIVE  NEGATIVE   Comment: MICROSCOPIC NOT DONE ON URINES WITH NEGATIVE PROTEIN, BLOOD, LEUKOCYTES, NITRITE, OR  GLUCOSE <1000 mg/dL.  POCT PREGNANCY, URINE     Status: Abnormal   Collection Time    05/26/14 11:03 AM      Result Value Ref Range   Preg Test, Ur POSITIVE (*) NEGATIVE   Comment:            THE SENSITIVITY OF THIS     METHODOLOGY IS >24 mIU/mL  WET PREP, GENITAL     Status: Abnormal   Collection Time    05/26/14 12:16 PM      Result Value Ref Range   Yeast Wet Prep HPF POC NONE SEEN  NONE SEEN   Trich, Wet Prep NONE SEEN  NONE SEEN   Clue Cells Wet Prep HPF POC FEW (*) NONE SEEN   WBC, Wet Prep HPF POC MANY (*) NONE SEEN   Comment: FEW BACTERIA SEEN  CBC WITH DIFFERENTIAL     Status: Abnormal   Collection Time    05/26/14 12:33 PM      Result Value Ref Range   WBC 5.0  4.0 - 10.5 K/uL   RBC 3.86 (*) 3.87 -  5.11 MIL/uL   Hemoglobin 11.5 (*) 12.0 - 15.0 g/dL   HCT 32.3 (*) 36.0 - 46.0 %   MCV 83.7  78.0 - 100.0 fL   MCH 29.8  26.0 - 34.0 pg   MCHC 35.6  30.0 - 36.0 g/dL   RDW 12.8  11.5 - 15.5 %   Platelets 217  150 - 400 K/uL   Neutrophils Relative % 49  43 - 77 %   Neutro Abs 2.4  1.7 - 7.7 K/uL   Lymphocytes Relative 39  12 - 46 %   Lymphs Abs 2.0  0.7 - 4.0 K/uL   Monocytes Relative 10  3 - 12 %   Monocytes Absolute 0.5  0.1 - 1.0 K/uL   Eosinophils Relative 2  0 - 5 %   Eosinophils Absolute 0.1  0.0 - 0.7 K/uL   Basophils Relative 0  0 - 1 %   Basophils Absolute 0.0  0.0 - 0.1 K/uL  RAPID HIV SCREEN ([facility])     Status: None   Collection Time    05/26/14 12:33 PM      Result Value Ref Range   SUDS Rapid HIV Screen NON REACTIVE  NON REACTIVE    Review of Systems  Constitutional: Negative for fever and chills.  Eyes: Negative for blurred vision.  Respiratory: Negative for cough.   Cardiovascular: Positive for palpitations. Negative for chest pain, orthopnea and PND.  Gastrointestinal: Negative for heartburn and abdominal pain.  Genitourinary: Negative for dysuria, urgency, frequency, hematuria and flank pain.  Musculoskeletal: Positive for back pain.  Skin: Negative for rash.  Neurological: Negative for dizziness and headaches.  Psychiatric/Behavioral: Positive for depression. Negative for suicidal ideas.    Physical Exam   Blood pressure 115/72, pulse 84, temperature 97.6 F (36.4 C), resp. rate 18, height 5\' 3"  (1.6 m), weight 66.282 kg (146 lb 2 oz), last menstrual period 04/02/2014, SpO2 100.00%, unknown if currently breastfeeding.  Physical Exam  Constitutional: She is oriented to person, place, and time. She appears well-developed and well-nourished.  HENT:  Head: Normocephalic and atraumatic.  Neck: Normal range of motion. Neck supple. No tracheal deviation present. No thyromegaly present.  Cardiovascular: Normal rate, regular rhythm and normal heart sounds.   No  murmur heard. Respiratory: Effort normal and breath sounds normal. No respiratory distress. She has no wheezes. She has no rales. She exhibits  no tenderness.  GI: Soft. She exhibits no distension. There is no tenderness. There is no rebound and no guarding.  Gravid with fundus mid-way between symphisis and umbilicus  Genitourinary: Vagina normal and uterus normal.  Mild/moderate amount white discharge, no odor, normal cervix, no adnexal or CMT  Musculoskeletal: She exhibits no edema.  Neurological: She is alert and oriented to person, place, and time. No cranial nerve deficit.  5/5 LE strength, straight-leg negative, distal sensation to light touch intact bilaterally  Skin: Skin is warm and dry.  Psychiatric:  Tears easily    MAU Course  Procedures None  MDM UA CBC EKG: normal sinus rhythm.    Assessment and Plan  28 yo [id number] @ 10+4 by unsure LMP, s/p induced vaginal delivery 10/2013, presents with palpitations, vaginal discharge, and low back pain.  # Palpitations - normal physical exam, normal ECG, not anemic (hemoglobin 11.5). Not obtaining TSH as this facility not prepared to provide that f/u but would recommend at initial prenatal should symptoms persist.   Reassuringly, no other symptoms of hyperthyroid.  - reassurance provided that symptoms likely 2/2 normal cardiovascular changes associated with pregnancy.  - recommending establishing care ASAP with health department and continuing to take PNVs.  # Low back pain - absence of warning signs (weakness/numbness, loss of bowel/bladder, recent trauma), absence of infectious signs/symptoms, normal physical exam - reassurance provided, recommending tylenol prn, heat, relative reast  # Vaginal discharge - wet prep positive for rare clue cells but given absence of symptoms save for discharge we have decided to hold on treatment. GC/chlamydia/HIV pending. Reassured this likely normal physiologic discharge of pregnancy. Infection  return precautions provided.  First trimester warning signs discussed Return to MAU with any abdominal pain/vaginal bleeding Start prenatal care ASAP: HD info given Prenatal vitamins daily.    [place], [place] 05/26/2014, 11:40 AM   Evaluation and management procedures were performed by the resident under my supervision and collaboration. I have reviewed the note and chart, and I agree with the management and plan.  Darrelyn Hillock Rasch, NP 05/26/2014 1:25 PM   Addendum - for patient's history of preterm birth, we discussed that may be a candidate for 17-P beginning at 16 weeks, and for this reason we further encouraged her to establish care soon and inquire about this at her initial prenatal appointment.

## 2014-05-26 NOTE — MAU Note (Signed)
Pt presents to MAU with complaints of back pain, SOB, headache, and early pregnancy.

## 2014-05-26 NOTE — MAU Note (Signed)
Pt states here for low back pain. 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 Also yesterday had instance where "my heart hurt and I had to sit down for it to go away". Also was short of breath. No chest pain at present.

## 2014-05-27 LAB — GC/CHLAMYDIA PROBE AMP
CT Probe RNA: NEGATIVE
GC PROBE AMP APTIMA: NEGATIVE

## 2014-05-29 ENCOUNTER — Telehealth: Payer: Self-pay | Admitting: General Practice

## 2014-05-29 DIAGNOSIS — Z3490 Encounter for supervision of normal pregnancy, unspecified, unspecified trimester: Secondary | ICD-10-CM

## 2014-05-29 DIAGNOSIS — Z3687 Encounter for antenatal screening for uncertain dates: Secondary | ICD-10-CM

## 2014-05-29 NOTE — MAU Provider Note (Signed)
Pt needs outpatient [place] for dating.  Clinic will call patient and schedule her.

## 2014-05-29 NOTE — Telephone Encounter (Signed)
Message copied by Shelly Coss on Thu May 29, 2014  2:31 PM ------      Message from: Guss Bunde      Created: Thu May 29, 2014 10:22 AM       Please order patient an outpatient [place] for dating.  Call patient with results (after reviewing with MD du jour) ------

## 2014-05-29 NOTE — Telephone Encounter (Signed)
Patient called back to front office and I informed her of ultrasound appt. Patient verbalized understanding and states that an appt on Thursday or Friday would be better for her. Rescheduled ultrasound for 10/15 @ 1:45 and informed patient. Patient verbalized understanding and had no other questions.

## 2014-05-29 NOTE — Telephone Encounter (Signed)
Ultrasound scheduled for 10/13 @ 1:15. Called patient, no answer- left message stating we are trying to reach you with an upcoming appt, please call us back at the clinics

## 2014-06-03 ENCOUNTER — Ambulatory Visit (HOSPITAL_COMMUNITY): Payer: No Typology Code available for payment source

## 2014-06-05 ENCOUNTER — Other Ambulatory Visit (HOSPITAL_COMMUNITY): Payer: Medicaid Other

## 2014-06-05 ENCOUNTER — Ambulatory Visit (HOSPITAL_COMMUNITY): Payer: Medicaid Other

## 2014-06-05 ENCOUNTER — Ambulatory Visit (HOSPITAL_COMMUNITY)
Admission: RE | Admit: 2014-06-05 | Discharge: 2014-06-05 | Disposition: A | Discharge status: Home / Self Care | Payer: Medicaid Other | Source: Ambulatory Visit | Attending: Obstetrics & Gynecology | Admitting: Obstetrics & Gynecology

## 2014-06-05 DIAGNOSIS — Z36 Encounter for antenatal screening of mother: Secondary | ICD-10-CM | POA: Insufficient documentation

## 2014-06-05 DIAGNOSIS — Z3687 Encounter for antenatal screening for uncertain dates: Secondary | ICD-10-CM

## 2014-06-05 DIAGNOSIS — Z3490 Encounter for supervision of normal pregnancy, unspecified, unspecified trimester: Secondary | ICD-10-CM

## 2014-06-06 ENCOUNTER — Telehealth: Payer: Self-pay

## 2014-06-06 ENCOUNTER — Ambulatory Visit (HOSPITAL_COMMUNITY): Payer: No Typology Code available for payment source

## 2014-06-06 NOTE — Telephone Encounter (Signed)
Attempted to call patient. No answer. Left message stating that we are calling with results, please call clinic.

## 2014-06-06 NOTE — Telephone Encounter (Signed)
Message copied by Geanie Logan on Fri Jun 06, 2014 11:49 AM ------      Message from: Guss Bunde      Created: Thu Jun 05, 2014  6:57 PM       Pt has IUP.  [place] EDC: 12/20/2014      Please call patient and tell results. She should be taking OTC prenatal vitamins and should get prenatal care. ------

## 2014-06-09 NOTE — Telephone Encounter (Signed)
Attempted to contact patient. No answer. Left message stating we are calling with results, please call clinic.

## 2014-06-10 NOTE — Telephone Encounter (Signed)
Certified letter sent informing patient of U/S results and need to start [facility].

## 2014-06-23 ENCOUNTER — Encounter (HOSPITAL_COMMUNITY): Payer: Self-pay | Admitting: *Deleted

## 2014-07-03 ENCOUNTER — Telehealth: Payer: Self-pay | Admitting: General Practice

## 2014-07-03 NOTE — Telephone Encounter (Signed)
Attempt to call pt 4 times 10/23,10/27,11/05,11/12 LVM to pt for future appt (OB)  at [facility].   [place]

## 2014-07-14 LAB — OB RESULTS CONSOLE RPR: RPR: NONREACTIVE

## 2014-07-14 LAB — OB RESULTS CONSOLE GBS: STREP GROUP B AG: POSITIVE

## 2014-07-14 LAB — OB RESULTS CONSOLE GC/CHLAMYDIA
Chlamydia: NEGATIVE
GC PROBE AMP, GENITAL: NEGATIVE

## 2014-07-14 LAB — OB RESULTS CONSOLE HIV ANTIBODY (ROUTINE TESTING): HIV: NONREACTIVE

## 2014-09-02 ENCOUNTER — Encounter: Payer: Self-pay | Admitting: General Practice

## 2014-10-21 ENCOUNTER — Inpatient Hospital Stay (HOSPITAL_COMMUNITY)
Admission: AD | Admit: 2014-10-21 | Discharge: 2014-10-21 | Disposition: A | Discharge status: Home / Self Care | Payer: Medicaid Other | Source: Ambulatory Visit | Attending: Obstetrics and Gynecology | Admitting: Obstetrics and Gynecology

## 2014-10-21 ENCOUNTER — Encounter (HOSPITAL_COMMUNITY): Payer: Self-pay

## 2014-10-21 DIAGNOSIS — O212 Late vomiting of pregnancy: Secondary | ICD-10-CM | POA: Diagnosis present

## 2014-10-21 DIAGNOSIS — O9989 Other specified diseases and conditions complicating pregnancy, childbirth and the puerperium: Secondary | ICD-10-CM | POA: Insufficient documentation

## 2014-10-21 DIAGNOSIS — Z3A28 28 weeks gestation of pregnancy: Secondary | ICD-10-CM | POA: Diagnosis not present

## 2014-10-21 DIAGNOSIS — K529 Noninfective gastroenteritis and colitis, unspecified: Secondary | ICD-10-CM | POA: Insufficient documentation

## 2014-10-21 LAB — CBC WITH DIFFERENTIAL/PLATELET
BASOS ABS: 0 10*3/uL (ref 0.0–0.1)
BASOS PCT: 0 % (ref 0–1)
EOS ABS: 0 10*3/uL (ref 0.0–0.7)
Eosinophils Relative: 1 % (ref 0–5)
HCT: 33.5 % — ABNORMAL LOW (ref 36.0–46.0)
Hemoglobin: 11.8 g/dL — ABNORMAL LOW (ref 12.0–15.0)
Lymphocytes Relative: 12 % (ref 12–46)
Lymphs Abs: 0.8 10*3/uL (ref 0.7–4.0)
MCH: 30 pg (ref 26.0–34.0)
MCHC: 35.2 g/dL (ref 30.0–36.0)
MCV: 85.2 fL (ref 78.0–100.0)
Monocytes Absolute: 0.5 10*3/uL (ref 0.1–1.0)
Monocytes Relative: 7 % (ref 3–12)
NEUTROS ABS: 5.4 10*3/uL (ref 1.7–7.7)
Neutrophils Relative %: 80 % — ABNORMAL HIGH (ref 43–77)
Platelets: 154 10*3/uL (ref 150–400)
RBC: 3.93 MIL/uL (ref 3.87–5.11)
RDW: 13.2 % (ref 11.5–15.5)
WBC: 6.7 10*3/uL (ref 4.0–10.5)

## 2014-10-21 LAB — COMPREHENSIVE METABOLIC PANEL
ALT: 13 U/L (ref 0–35)
AST: 17 U/L (ref 0–37)
Albumin: 2.8 g/dL — ABNORMAL LOW (ref 3.5–5.2)
Alkaline Phosphatase: 49 U/L (ref 39–117)
Anion gap: 3 — ABNORMAL LOW (ref 5–15)
CO2: 25 mmol/L (ref 19–32)
Calcium: 8.2 mg/dL — ABNORMAL LOW (ref 8.4–10.5)
Chloride: 105 mmol/L (ref 96–112)
Creatinine, Ser: 0.33 mg/dL — ABNORMAL LOW (ref 0.50–1.10)
GFR calc Af Amer: >90 mL/min (ref >90)
GFR calc non Af Amer: >90 mL/min (ref >90)
Glucose, Bld: 80 mg/dL (ref 70–99)
POTASSIUM: 3.5 mmol/L (ref 3.5–5.1)
Sodium: 133 mmol/L — ABNORMAL LOW (ref 135–145)
Total Bilirubin: 1.1 mg/dL (ref 0.3–1.2)
Total Protein: 6.3 g/dL (ref 6.0–8.3)

## 2014-10-21 LAB — URINALYSIS, ROUTINE W REFLEX MICROSCOPIC
BILIRUBIN URINE: NEGATIVE
Glucose, UA: NEGATIVE mg/dL
HGB URINE DIPSTICK: NEGATIVE
KETONES UR: NEGATIVE mg/dL
Nitrite: NEGATIVE
PROTEIN: NEGATIVE mg/dL
SPECIFIC GRAVITY, URINE: 1.015 (ref 1.005–1.030)
UROBILINOGEN UA: 0.2 mg/dL (ref 0.0–1.0)
pH: 8 (ref 5.0–8.0)

## 2014-10-21 LAB — URINE MICROSCOPIC-ADD ON

## 2014-10-21 MED ORDER — SODIUM CHLORIDE 0.9 % IV SOLN
25.0000 mg | Freq: Once | INTRAVENOUS | Status: AC
  Administered 2014-10-21: 25 mg via INTRAVENOUS
  Filled 2014-10-21: qty 1

## 2014-10-21 NOTE — Discharge Instructions (Signed)

## 2014-10-21 NOTE — MAU Provider Note (Signed)
History     CSN: [id number]  Arrival date and time: 10/21/14 4268   First Provider Initiated Contact with Patient 10/21/14 1123      Chief Complaint  Patient presents with  . Abdominal Pain  . Emesis During Pregnancy   HPI This is a 29 y.o. female at [redacted]w[redacted]d who presents with c/o vomiting since 2am. Not able to keep any fluids down. No diarrhea. No contractions or bleeding. + FM.   RN Note:  Expand All Collapse All   Pt stated vomiting and pain lower abdomen that began 2am , also feeling very hot. Denies bleeding and vaginal discharge and reports good fetal movement.         OB History    Gravida Para Term Preterm AB TAB SAB Ectopic Multiple Living   6 4 2 2 1  1   4       Past Medical History  Diagnosis Date  . Anemia   . Hx of gonorrhea   . Hx of chlamydia infection     Past Surgical History  Procedure Laterality Date  . No past surgeries      Family History  Problem Relation Age of Onset  . Hypertension Mother   . Diabetes Father     History  Substance Use Topics  . Smoking status: Never Smoker   . Smokeless tobacco: Never Used  . Alcohol Use: No    Allergies: No Known Allergies  Prescriptions prior to admission  Medication Sig Dispense Refill Last Dose  . Prenatal Vit-Fe Fumarate-FA (PRENATAL MULTIVITAMIN) TABS tablet Take 1 tablet by mouth daily at 12 noon.   10/20/2014 at Unknown time    Review of Systems  Constitutional: Positive for malaise/fatigue. Negative for fever and chills.  Gastrointestinal: Positive for nausea and vomiting. Negative for abdominal pain, diarrhea and constipation.  Genitourinary: Negative for dysuria.  Musculoskeletal: Negative for myalgias.  Neurological: Negative for dizziness, weakness and headaches.   Physical Exam   Blood pressure 113/71, pulse 105, temperature 97.9 F (36.6 C), temperature source Oral, resp. rate 18, last menstrual period 04/02/2014, SpO2 100 %, unknown if currently breastfeeding.  Physical  Exam  Constitutional: She is oriented to person, place, and time. She appears well-developed and well-nourished. No distress.  HENT:  Head: Normocephalic.  Cardiovascular: Normal rate, regular rhythm and normal heart sounds.  Exam reveals no gallop and no friction rub.   No murmur (slightly rapid HR 90s) heard. Respiratory: Effort normal and breath sounds normal.  GI: Soft. She exhibits no distension and no mass. There is tenderness (diffuse mild tenderness throughout). There is no rebound and no guarding.  Genitourinary:  FHR reactive with occasional contraction  Musculoskeletal: Normal range of motion.  Neurological: She is alert and oriented to person, place, and time.  Skin: Skin is warm and dry.  Psychiatric: She has a normal mood and affect.    MAU Course  Procedures  MDM Results for orders placed or performed during the hospital encounter of 10/21/14 (from the past 24 hour(s))  Urinalysis, Routine w reflex microscopic     Status: Abnormal   Collection Time: 10/21/14  9:35 AM  Result Value Ref Range   Color, Urine YELLOW YELLOW   APPearance CLEAR CLEAR   Specific Gravity, Urine 1.015 1.005 - 1.030   pH 8.0 5.0 - 8.0   Glucose, UA NEGATIVE NEGATIVE mg/dL   Hgb urine dipstick NEGATIVE NEGATIVE   Bilirubin Urine NEGATIVE NEGATIVE   Ketones, ur NEGATIVE NEGATIVE mg/dL   Protein, ur  NEGATIVE NEGATIVE mg/dL   Urobilinogen, UA 0.2 0.0 - 1.0 mg/dL   Nitrite NEGATIVE NEGATIVE   Leukocytes, UA LARGE (A) NEGATIVE  Urine microscopic-add on     Status: None   Collection Time: 10/21/14  9:35 AM  Result Value Ref Range   Squamous Epithelial / LPF RARE RARE   WBC, UA 7-10 <3 WBC/hpf   Bacteria, UA RARE RARE  CBC with Differential     Status: Abnormal   Collection Time: 10/21/14 11:06 AM  Result Value Ref Range   WBC 6.7 4.0 - 10.5 K/uL   RBC 3.93 3.87 - 5.11 MIL/uL   Hemoglobin 11.8 (L) 12.0 - 15.0 g/dL   HCT 33.5 (L) 36.0 - 46.0 %   MCV 85.2 78.0 - 100.0 fL   MCH 30.0 26.0 -  34.0 pg   MCHC 35.2 30.0 - 36.0 g/dL   RDW 13.2 11.5 - 15.5 %   Platelets 154 150 - 400 K/uL   Neutrophils Relative % 80 (H) 43 - 77 %   Neutro Abs 5.4 1.7 - 7.7 K/uL   Lymphocytes Relative 12 12 - 46 %   Lymphs Abs 0.8 0.7 - 4.0 K/uL   Monocytes Relative 7 3 - 12 %   Monocytes Absolute 0.5 0.1 - 1.0 K/uL   Eosinophils Relative 1 0 - 5 %   Eosinophils Absolute 0.0 0.0 - 0.7 K/uL   Basophils Relative 0 0 - 1 %   Basophils Absolute 0.0 0.0 - 0.1 K/uL  Comprehensive metabolic panel     Status: Abnormal   Collection Time: 10/21/14 11:06 AM  Result Value Ref Range   Sodium 133 (L) 135 - 145 mmol/L   Potassium 3.5 3.5 - 5.1 mmol/L   Chloride 105 96 - 112 mmol/L   CO2 25 19 - 32 mmol/L   Glucose, Bld 80 70 - 99 mg/dL   BUN <5 (L) 6 - 23 mg/dL   Creatinine, Ser 0.33 (L) 0.50 - 1.10 mg/dL   Calcium 8.2 (L) 8.4 - 10.5 mg/dL   Total Protein 6.3 6.0 - 8.3 g/dL   Albumin 2.8 (L) 3.5 - 5.2 g/dL   AST 17 0 - 37 U/L   ALT 13 0 - 35 U/L   Alkaline Phosphatase 49 39 - 117 U/L   Total Bilirubin 1.1 0.3 - 1.2 mg/dL   GFR calc non Af Amer >90 >90 mL/min   GFR calc Af Amer >90 >90 mL/min   Anion gap 3 (L) 5 - 15   Felt better after IV. Has not vomited at all while here  Assessment and Plan  A;  SIUP at [redacted]w[redacted]d        Gastroenteritis  P:  Discharge home       Discussed earlier with Dr Melba Coon       Patient to rest and add PO fluids at home       Pt will call if she continues to vomit or be nauseated  Suspect she will improve and may develop diarrhea as she recovers for a few days. Encouraged good handwashing at home. Advance diet as tolerated   [facility] 10/21/2014, 11:23 AM

## 2014-10-21 NOTE — MAU Note (Signed)
Pt stated vomiting and pain lower abdomen that began 2am , also feeling very hot. Denies bleeding and vaginal discharge and reports good fetal movement.

## 2014-12-04 ENCOUNTER — Inpatient Hospital Stay (HOSPITAL_COMMUNITY)
Admission: AD | Admit: 2014-12-04 | Discharge: 2014-12-06 | DRG: 775 | Disposition: A | Discharge status: Home / Self Care | Payer: Medicaid Other | Source: Ambulatory Visit | Attending: Obstetrics and Gynecology | Admitting: Obstetrics and Gynecology

## 2014-12-04 ENCOUNTER — Encounter (HOSPITAL_COMMUNITY): Payer: Self-pay | Admitting: *Deleted

## 2014-12-04 DIAGNOSIS — Z8611 Personal history of tuberculosis: Secondary | ICD-10-CM

## 2014-12-04 DIAGNOSIS — O99824 Streptococcus B carrier state complicating childbirth: Secondary | ICD-10-CM | POA: Diagnosis present

## 2014-12-04 DIAGNOSIS — Z3483 Encounter for supervision of other normal pregnancy, third trimester: Secondary | ICD-10-CM | POA: Diagnosis present

## 2014-12-04 DIAGNOSIS — Z3A37 37 weeks gestation of pregnancy: Secondary | ICD-10-CM | POA: Diagnosis present

## 2014-12-04 LAB — TYPE AND SCREEN
ABO/RH(D): A POS
Antibody Screen: NEGATIVE

## 2014-12-04 LAB — CBC
HEMATOCRIT: 34 % — AB (ref 36.0–46.0)
HEMOGLOBIN: 12.3 g/dL (ref 12.0–15.0)
MCH: 30.4 pg (ref 26.0–34.0)
MCHC: 36.2 g/dL — ABNORMAL HIGH (ref 30.0–36.0)
MCV: 84 fL (ref 78.0–100.0)
Platelets: 182 10*3/uL (ref 150–400)
RBC: 4.05 MIL/uL (ref 3.87–5.11)
RDW: 13.9 % (ref 11.5–15.5)
WBC: 8.7 10*3/uL (ref 4.0–10.5)

## 2014-12-04 LAB — ABO/RH: ABO/RH(D): A POS

## 2014-12-04 MED ORDER — OXYCODONE-ACETAMINOPHEN 5-325 MG PO TABS
1.0000 | ORAL_TABLET | ORAL | Status: DC | PRN
  Administered 2014-12-04: 1 via ORAL
  Filled 2014-12-04: qty 1

## 2014-12-04 MED ORDER — PRENATAL MULTIVITAMIN CH: 1.0000 | ORAL_TABLET | Freq: Every day | ORAL | Status: DC

## 2014-12-04 MED ORDER — DIBUCAINE 1 % RE OINT: 1.0000 "application " | TOPICAL_OINTMENT | RECTAL | Status: DC | PRN

## 2014-12-04 MED ORDER — OXYCODONE-ACETAMINOPHEN 5-325 MG PO TABS: 2.0000 | ORAL_TABLET | ORAL | Status: DC | PRN

## 2014-12-04 MED ORDER — OXYTOCIN 40 UNITS IN LACTATED RINGERS INFUSION - SIMPLE MED
62.5000 mL/h | INTRAVENOUS | Status: DC
  Administered 2014-12-04: 62.5 mL/h via INTRAVENOUS
  Filled 2014-12-04: qty 1000

## 2014-12-04 MED ORDER — CITRIC ACID-SODIUM CITRATE 334-500 MG/5ML PO SOLN: 30.0000 mL | ORAL | Status: DC | PRN

## 2014-12-04 MED ORDER — LACTATED RINGERS IV SOLN
500.0000 mL | Freq: Once | INTRAVENOUS | Status: DC
Start: 2014-12-04 — End: 2014-12-04

## 2014-12-04 MED ORDER — LANOLIN HYDROUS EX OINT: TOPICAL_OINTMENT | CUTANEOUS | Status: DC | PRN

## 2014-12-04 MED ORDER — OXYCODONE-ACETAMINOPHEN 5-325 MG PO TABS: 1.0000 | ORAL_TABLET | ORAL | Status: DC | PRN

## 2014-12-04 MED ORDER — SODIUM CHLORIDE 0.9 % IV SOLN
2.0000 g | Freq: Once | INTRAVENOUS | Status: AC
  Administered 2014-12-04: 2 g via INTRAVENOUS
  Filled 2014-12-04: qty 2000

## 2014-12-04 MED ORDER — LIDOCAINE HCL (PF) 1 % IJ SOLN
30.0000 mL | INTRAMUSCULAR | Status: AC | PRN
  Administered 2014-12-04: 30 mL via SUBCUTANEOUS
  Filled 2014-12-04: qty 30

## 2014-12-04 MED ORDER — IBUPROFEN 600 MG PO TABS
600.0000 mg | ORAL_TABLET | Freq: Four times a day (QID) | ORAL | Status: DC
  Administered 2014-12-04 – 2014-12-06 (×9): 600 mg via ORAL
  Filled 2014-12-04 (×9): qty 1

## 2014-12-04 MED ORDER — SENNOSIDES-DOCUSATE SODIUM 8.6-50 MG PO TABS
2.0000 | ORAL_TABLET | ORAL | Status: DC
  Administered 2014-12-05 (×2): 2 via ORAL
  Filled 2014-12-04 (×2): qty 2

## 2014-12-04 MED ORDER — BENZOCAINE-MENTHOL 20-0.5 % EX AERO: 1.0000 "application " | INHALATION_SPRAY | CUTANEOUS | Status: DC | PRN

## 2014-12-04 MED ORDER — TETANUS-DIPHTH-ACELL PERTUSSIS 5-2.5-18.5 LF-MCG/0.5 IM SUSP: 0.5000 mL | Freq: Once | INTRAMUSCULAR | Status: DC

## 2014-12-04 MED ORDER — EPHEDRINE 5 MG/ML INJ
10.0000 mg | INTRAVENOUS | Status: DC | PRN
  Filled 2014-12-04: qty 2

## 2014-12-04 MED ORDER — ONDANSETRON HCL 4 MG/2ML IJ SOLN: 4.0000 mg | Freq: Four times a day (QID) | INTRAMUSCULAR | Status: DC | PRN

## 2014-12-04 MED ORDER — MEASLES, MUMPS & RUBELLA VAC ~~LOC~~ INJ: 0.5000 mL | INJECTION | Freq: Once | SUBCUTANEOUS | Status: DC

## 2014-12-04 MED ORDER — LACTATED RINGERS IV SOLN: INTRAVENOUS | Status: DC

## 2014-12-04 MED ORDER — ONDANSETRON HCL 4 MG/2ML IJ SOLN: 4.0000 mg | INTRAMUSCULAR | Status: DC | PRN

## 2014-12-04 MED ORDER — DIPHENHYDRAMINE HCL 50 MG/ML IJ SOLN: 12.5000 mg | INTRAMUSCULAR | Status: DC | PRN

## 2014-12-04 MED ORDER — ONDANSETRON HCL 4 MG PO TABS: 4.0000 mg | ORAL_TABLET | ORAL | Status: DC | PRN

## 2014-12-04 MED ORDER — ACETAMINOPHEN 325 MG PO TABS
650.0000 mg | ORAL_TABLET | ORAL | Status: DC | PRN
Start: 2014-12-04 — End: 2014-12-04

## 2014-12-04 MED ORDER — OXYTOCIN BOLUS FROM INFUSION
500.0000 mL | INTRAVENOUS | Status: DC
  Administered 2014-12-04: 500 mL via INTRAVENOUS

## 2014-12-04 MED ORDER — LACTATED RINGERS IV SOLN
INTRAVENOUS | Status: DC
  Administered 2014-12-04: 06:00:00 via INTRAVENOUS

## 2014-12-04 MED ORDER — PHENYLEPHRINE 40 MCG/ML (10ML) SYRINGE FOR IV PUSH (FOR BLOOD PRESSURE SUPPORT)
80.0000 ug | PREFILLED_SYRINGE | INTRAVENOUS | Status: DC | PRN
  Filled 2014-12-04: qty 2

## 2014-12-04 MED ORDER — OXYTOCIN 40 UNITS IN LACTATED RINGERS INFUSION - SIMPLE MED: 62.5000 mL/h | INTRAVENOUS | Status: AC

## 2014-12-04 MED ORDER — LACTATED RINGERS IV SOLN: 500.0000 mL | INTRAVENOUS | Status: DC | PRN

## 2014-12-04 MED ORDER — DIPHENHYDRAMINE HCL 25 MG PO CAPS: 25.0000 mg | ORAL_CAPSULE | Freq: Four times a day (QID) | ORAL | Status: DC | PRN

## 2014-12-04 MED ORDER — PRENATAL MULTIVITAMIN CH
1.0000 | ORAL_TABLET | Freq: Every day | ORAL | Status: AC
  Administered 2014-12-04 – 2014-12-06 (×3): 1 via ORAL
  Filled 2014-12-04 (×3): qty 1

## 2014-12-04 MED ORDER — ZOLPIDEM TARTRATE 5 MG PO TABS: 5.0000 mg | ORAL_TABLET | Freq: Every evening | ORAL | Status: DC | PRN

## 2014-12-04 MED ORDER — ACETAMINOPHEN 325 MG PO TABS
650.0000 mg | ORAL_TABLET | ORAL | Status: DC | PRN
Start: 2014-12-04 — End: 2014-12-06

## 2014-12-04 MED ORDER — FENTANYL 2.5 MCG/ML BUPIVACAINE 1/10 % EPIDURAL INFUSION (WH - ANES): 14.0000 mL/h | INTRAMUSCULAR | Status: DC | PRN

## 2014-12-04 MED ORDER — SIMETHICONE 80 MG PO CHEW: 80.0000 mg | CHEWABLE_TABLET | ORAL | Status: DC | PRN

## 2014-12-04 MED ORDER — FLEET ENEMA 7-19 GM/118ML RE ENEM: 1.0000 | ENEMA | RECTAL | Status: DC | PRN

## 2014-12-04 MED ORDER — WITCH HAZEL-GLYCERIN EX PADS: 1.0000 "application " | MEDICATED_PAD | CUTANEOUS | Status: DC | PRN

## 2014-12-04 NOTE — MAU Note (Signed)
Contractions started at 0145. Pt denies leaking of fluid and bleeding, baby is active.
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
## 2014-12-04 NOTE — Progress Notes (Signed)
Patient ID: [name], female   DOB: [birth year], 29 y.o.   MRN: [id number] Delivery note:  The pt progressed rapidly to full dilatation and delivered spontaneously ROA over an intact perineum a living female infant with Apgars 9 and 9 at 1 and 5 minutes. The placenta was intact and the uterus was normal.There was a left labial laceration that was bleeding and sutured with 3-0 vicryl under local block with 1% xylocaine.  EBL 200 cc's.

## 2014-12-04 NOTE — MAU Note (Signed)
Contractions  

## 2014-12-04 NOTE — Progress Notes (Deleted)
Patient ID: [name], female   DOB: January 25, 1986, 29 y.o.   MRN: [id number] I examined this lady 12-03-14 and she reports no change in her health since that time.

## 2014-12-04 NOTE — Lactation Note (Signed)
This note was copied from the chart of Julia Laykin Rainone. Lactation Consultation Note Initial visit at 9 hours of age.  Mom reports several good feedings and denies pain with latch.  MOm is holding baby STS now. Mom has experience with breastfeeding 3-4 months with each of 4 older children.   [facility] LC resources given and discussed.  Encouraged to feed with early cues on demand.  Early newborn behavior discussed.  Hand expression discussed with mom, she remembers how from her now 42 year old.  Encouraged mom to hand express prior to feedings.  Mom to call for assist as needed.    Patient Name: [name] [id number] Date: 12/04/2014 Reason for consult: Initial assessment   Maternal Data    Feeding Feeding Type: Breast Fed Length of feed: 10 min  LATCH Score/Interventions                Intervention(s): Breastfeeding basics reviewed     Lactation Tools Discussed/Used WIC Program: Yes   Consult Status Consult Status: Follow-up Date: 12/05/14 Follow-up type: In-patient    Justice Britain 12/04/2014, 4:46 PM

## 2014-12-05 LAB — CBC
HCT: 31.5 % — ABNORMAL LOW (ref 36.0–46.0)
HEMOGLOBIN: 10.9 g/dL — AB (ref 12.0–15.0)
MCH: 29.5 pg (ref 26.0–34.0)
MCHC: 34.6 g/dL (ref 30.0–36.0)
MCV: 85.1 fL (ref 78.0–100.0)
Platelets: 184 10*3/uL (ref 150–400)
RBC: 3.7 MIL/uL — AB (ref 3.87–5.11)
RDW: 13.9 % (ref 11.5–15.5)
WBC: 9 10*3/uL (ref 4.0–10.5)

## 2014-12-05 LAB — RPR: RPR Ser Ql: NONREACTIVE

## 2014-12-05 NOTE — Progress Notes (Signed)
Patient ID: [name], female   DOB: August 10, 1986, 29 y.o.   MRN: [id number] #1 afebrile BP normal HGB stable. No complaints Baby will need to stay 48 hours for + GBS and short treatment time

## 2014-12-05 NOTE — Lactation Note (Signed)
This note was copied from the chart of Julia Addalynn Kumari. Lactation Consultation Note Baby hasn't had a stool in over 32 hrs. I done leg exercises to stimulate to have BM.  Uncovered baby from blanket and sleeper. Room warm, encouraged mom to stimulate baby and BF every 2-3 hours. Assisted in football position and baby latched great and BF good. Heard swallows. Mom has colostrum easy flow. Encouraged to massage breast during BF.  RN came into room discussing BM monitoring. Baby voided during leg maneuvers. noted abd. Distended during exercises.  Patient Name: [name] [id number] Date: 12/05/2014 Reason for consult: Follow-up assessment   Maternal Data    Feeding Feeding Type: Breast Fed Length of feed: 10 min (still BF)  LATCH Score/Interventions Latch: Grasps breast easily, tongue down, lips flanged, rhythmical sucking.  Audible Swallowing: A few with stimulation Intervention(s): Hand expression  Type of Nipple: Everted at rest and after stimulation  Comfort (Breast/Nipple): Soft / non-tender     Hold (Positioning): Assistance needed to correctly position infant at breast and maintain latch. Intervention(s): Breastfeeding basics reviewed;Support Pillows;Position options;Skin to skin  LATCH Score: 8  Lactation Tools Discussed/Used     Consult Status Consult Status: Follow-up Date: 12/06/14 Follow-up type: In-patient    Theodoro Kalata 12/05/2014, 3:21 PM

## 2014-12-05 NOTE — Lactation Note (Signed)
This note was copied from the chart of [name]. Lactation Consultation Note Mom eating lunch and company in rm. Encouraged to call for next feeding to assist in BF and assess. Noted baby's tongue stick far out of mouth continuously. Patient Name: [name] [id number] Date: 12/05/2014 Reason for consult: Follow-up assessment;Infant weight loss   Maternal Data    Feeding Feeding Type: Breast Fed  LATCH Score/Interventions                      Lactation Tools Discussed/Used     Consult Status      Danyah Guastella G 12/05/2014, 2:48 PM

## 2014-12-06 MED ORDER — IBUPROFEN 600 MG PO TABS: 600.0000 mg | ORAL_TABLET | Freq: Four times a day (QID) | ORAL | Status: DC

## 2014-12-06 MED ORDER — OXYCODONE-ACETAMINOPHEN 5-325 MG PO TABS: 1.0000 | ORAL_TABLET | ORAL | Status: DC | PRN

## 2014-12-06 NOTE — Discharge Instructions (Signed)
As per discharge pamphlet °

## 2014-12-06 NOTE — Discharge Summary (Signed)
Obstetric Discharge Summary Reason for Admission: onset of labor Prenatal Procedures: none Intrapartum Procedures: spontaneous vaginal delivery Postpartum Procedures: none Complications-Operative and Postpartum: labial laceration HEMOGLOBIN  Date Value Ref Range Status  12/05/2014 10.9* 12.0 - 15.0 g/dL Final   HCT  Date Value Ref Range Status  12/05/2014 31.5* 36.0 - 46.0 % Final    Physical Exam:  General: alert Lochia: appropriate Uterine Fundus: firm   Discharge Diagnoses: Term Pregnancy-delivered  Discharge Information: Date: 12/06/2014 Activity: pelvic rest Diet: routine Medications: Ibuprofen and Percocet Condition: stable Instructions: refer to practice specific booklet Discharge to: home Follow-up Information    Follow up with Melina Schools, MD. Schedule an appointment as soon as possible for a visit in 6 weeks.   Specialty:  Obstetrics and Gynecology   Contact information:   [place], [place] [phone] [phone]       Newborn Data: Live born female  Birth Weight: 6 lb 10.5 oz (3019 g) APGAR: 8, 9  Home with mother.  Deitra Craine D 12/06/2014, 10:02 AM

## 2014-12-06 NOTE — Lactation Note (Signed)
This note was copied from the chart of [name]. Lactation Consultation Note  Patient Name: [name] [id number] Date: 12/06/2014 Reason for consult: Follow-up assessment   With this mom of a now [redacted] week gestation baby, now 16 hours old. Mom is an experienced breast feeder. I observed the baby latch, and she open wide and is eager. I did show mom how to obtain a deeper latch, and she reports this feeling better.  Mom's breasts are full but soft. The baby has had 9 voids but no stools since birth. I did gentle rectal stimulation with gloved finger and vaseline, with no stool. There was a tiny speck of dry stool in the diaper, which I showed to mom and left in the diaper. MOm doing well otherwise, and knows to call for questions/conerns.   Maternal Data    Feeding Feeding Type: Breast Fed Length of feed: 25 min  LATCH Score/Interventions Latch: Grasps breast easily, tongue down, lips flanged, rhythmical sucking.  Audible Swallowing: A few with stimulation  Type of Nipple: Everted at rest and after stimulation  Comfort (Breast/Nipple): Soft / non-tender     Hold (Positioning): Assistance needed to correctly position infant at breast and maintain latch. Intervention(s): Breastfeeding basics reviewed;Support Pillows;Position options;Skin to skin  LATCH Score: 8  Lactation Tools Discussed/Used     Consult Status Consult Status: Complete Follow-up type: Call as needed    Tonna Corner 12/06/2014, 10:25 AM

## 2014-12-06 NOTE — Progress Notes (Signed)
Clinical Social Work Department PSYCHOSOCIAL ASSESSMENT - MATERNAL/CHILD 12/06/2014  Patient:  LANEKA, MCGRORY  Account Number:  [id number]  False [place] Date:  12/04/2014  Ardine Eng Name:   Maury Dus    Clinical Social Worker:  Genever Hentges, LCSW   Date/Time:  12/06/2014 12:30 PM  Date Referred:  12/04/2014   Referral source  [facility] Nurs[facility]     Other referral source:    I:  FAMILY / [place] legal guardian:  PARENT  Guardian - Name Guardian - Age Guardian - Address  Hank,Mylene M [address]  [address], [place] [address]   Other household support members/support persons Other support:    II  PSYCHOSOCIAL DATA Information Source:    Occupational hygienist Employment:   MOB is employed   Museum/gallery curator resources:  [employer] [place]:   Other  [place] / Grade:   Maternity Care Coordinator / Child Services Coordination / Early Interventions:  Cultural issues impacting care:    III  STRENGTHS Strengths  Supportive family/friends  Home prepared for Child (including basic supplies)  Adequate Resources   Strength comment:    IV  RISK FACTORS AND CURRENT PROBLEMS Current Problem:       V  SOCIAL WORK ASSESSMENT Acknowledged order for social work consult due to late prenatal care and it is questionable whether mother has custody of her children.  Met with mother who was pleasant and receptive to social work intervention.  Her sister-in-law was also present and very supportive. Informed that she and her children are living with with maternal uncle.   Mother states that she has 4 other dependents ages 23,8,3 and 1.   Informed that the 27 year old lives in [place] with her father.  Mother states that it is not clear who is the father of this child.  She plans to have DNA testing.   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  Informed that she sought treatment and was prescribed Zoloft, but only took it  once because of the side effects.  Mother states that the symptoms eventually resolved.  She denies any currently symptoms of depression or anxiety.  She also denies any hx of substance abuse.  Affect and behavior was appropriate during CSW visit.  No acute social concerns related at this time. Mother informed of CSW availability.      VI SOCIAL WORK PLAN Social Work Plan  No Further Intervention Required / No Barriers to Discharge   Type of pt/family education:   If child protective services report - county:   If child protective services report - date:   Information/referral to community resources comment:   Other social work plan:

## 2014-12-06 NOTE — Progress Notes (Signed)
PPD #2 Doing well Afeb, VSS Fundus firm D/c home 

## 2014-12-07 ENCOUNTER — Ambulatory Visit: Payer: Self-pay

## 2014-12-07 NOTE — Lactation Note (Signed)
This note was copied from the chart of Julia Brodi Nery. Lactation Consultation Note  Patient Name: [name] TOIZT'I Date: 12/07/2014 Reason for consult: Follow-up assessmentwith this mom and late pre term baby, now 71 1/7 weeks CGA, weighing just over 6 pounds. The baby is goind home today due to a good weight gain. Mom has been pumping while in the hospital, and expressing up to 45 mls. I advised her to use her hand pump over night, since she has a [facility] appointmetn for tomorrow, and she may be albe to get a DEP. I faxed information on mom and [facility] to [facility]. Mom knows to call lactation for questions/concerns.   Maternal Data    Feeding Feeding Type: Breast Fed Length of feed: 15 min  LATCH Score/Interventions                      Lactation Tools Discussed/Used WIC Program: Yes   Consult Status Consult Status: Complete Follow-up type: Call as needed    Tonna Corner 12/07/2014, 4:33 PM

## 2014-12-08 NOTE — Progress Notes (Signed)
Post discharge ur review completed. 

## 2015-11-11 ENCOUNTER — Encounter (HOSPITAL_COMMUNITY): Payer: Self-pay | Admitting: Emergency Medicine

## 2015-11-11 ENCOUNTER — Emergency Department (HOSPITAL_COMMUNITY)
Admission: EM | Admit: 2015-11-11 | Discharge: 2015-11-12 | Disposition: A | Discharge status: Home / Self Care | Payer: Medicaid Other | Attending: Emergency Medicine | Admitting: Emergency Medicine

## 2015-11-11 DIAGNOSIS — Z3202 Encounter for pregnancy test, result negative: Secondary | ICD-10-CM | POA: Insufficient documentation

## 2015-11-11 DIAGNOSIS — R197 Diarrhea, unspecified: Secondary | ICD-10-CM | POA: Diagnosis not present

## 2015-11-11 DIAGNOSIS — R102 Pelvic and perineal pain: Secondary | ICD-10-CM

## 2015-11-11 DIAGNOSIS — Z8619 Personal history of other infectious and parasitic diseases: Secondary | ICD-10-CM | POA: Diagnosis not present

## 2015-11-11 DIAGNOSIS — Z79899 Other long term (current) drug therapy: Secondary | ICD-10-CM | POA: Insufficient documentation

## 2015-11-11 DIAGNOSIS — R1030 Lower abdominal pain, unspecified: Secondary | ICD-10-CM | POA: Diagnosis present

## 2015-11-11 DIAGNOSIS — N730 Acute parametritis and pelvic cellulitis: Secondary | ICD-10-CM | POA: Diagnosis not present

## 2015-11-11 DIAGNOSIS — Z862 Personal history of diseases of the blood and blood-forming organs and certain disorders involving the immune mechanism: Secondary | ICD-10-CM | POA: Diagnosis not present

## 2015-11-11 NOTE — ED Notes (Signed)
Pt states that she had had N/V/D today with sharp abdominal pain. Alert and oriented.

## 2015-11-12 LAB — URINALYSIS, ROUTINE W REFLEX MICROSCOPIC
BILIRUBIN URINE: NEGATIVE
GLUCOSE, UA: NEGATIVE mg/dL
Hgb urine dipstick: NEGATIVE
KETONES UR: NEGATIVE mg/dL
LEUKOCYTES UA: NEGATIVE
Nitrite: NEGATIVE
PH: 5.5 (ref 5.0–8.0)
Protein, ur: NEGATIVE mg/dL
SPECIFIC GRAVITY, URINE: 1.022 (ref 1.005–1.030)

## 2015-11-12 LAB — COMPREHENSIVE METABOLIC PANEL
ALBUMIN: 4.2 g/dL (ref 3.5–5.0)
ALT: 14 U/L (ref 14–54)
AST: 17 U/L (ref 15–41)
Alkaline Phosphatase: 36 U/L — ABNORMAL LOW (ref 38–126)
Anion gap: 10 (ref 5–15)
BUN: 13 mg/dL (ref 6–20)
CHLORIDE: 103 mmol/L (ref 101–111)
CO2: 21 mmol/L — ABNORMAL LOW (ref 22–32)
Calcium: 9.3 mg/dL (ref 8.9–10.3)
Creatinine, Ser: 0.45 mg/dL (ref 0.44–1.00)
GFR calc Af Amer: >60 mL/min (ref >60)
GFR calc non Af Amer: >60 mL/min (ref >60)
GLUCOSE: 89 mg/dL (ref 65–99)
POTASSIUM: 3.5 mmol/L (ref 3.5–5.1)
Sodium: 134 mmol/L — ABNORMAL LOW (ref 135–145)
Total Bilirubin: 1.3 mg/dL — ABNORMAL HIGH (ref 0.3–1.2)
Total Protein: 7.5 g/dL (ref 6.5–8.1)

## 2015-11-12 LAB — WET PREP, GENITAL
Sperm: NONE SEEN
Trich, Wet Prep: NONE SEEN
YEAST WET PREP: NONE SEEN

## 2015-11-12 LAB — CBC
HEMATOCRIT: 34.2 % — AB (ref 36.0–46.0)
HEMOGLOBIN: 12.2 g/dL (ref 12.0–15.0)
MCH: 28.6 pg (ref 26.0–34.0)
MCHC: 35.7 g/dL (ref 30.0–36.0)
MCV: 80.3 fL (ref 78.0–100.0)
Platelets: 286 10*3/uL (ref 150–400)
RBC: 4.26 MIL/uL (ref 3.87–5.11)
RDW: 12.6 % (ref 11.5–15.5)
WBC: 8.9 10*3/uL (ref 4.0–10.5)

## 2015-11-12 LAB — I-STAT BETA HCG BLOOD, ED (MC, WL, AP ONLY)

## 2015-11-12 LAB — LIPASE, BLOOD: LIPASE: 27 U/L (ref 11–51)

## 2015-11-12 MED ORDER — CEFTRIAXONE SODIUM 250 MG IJ SOLR
250.0000 mg | Freq: Once | INTRAMUSCULAR | Status: AC
  Administered 2015-11-12: 250 mg via INTRAMUSCULAR
  Filled 2015-11-12: qty 250

## 2015-11-12 MED ORDER — AZITHROMYCIN 250 MG PO TABS
1000.0000 mg | ORAL_TABLET | Freq: Once | ORAL | Status: AC
  Administered 2015-11-12: 1000 mg via ORAL
  Filled 2015-11-12: qty 4

## 2015-11-12 MED ORDER — OXYCODONE-ACETAMINOPHEN 5-325 MG PO TABS
1.0000 | ORAL_TABLET | Freq: Once | ORAL | Status: AC
  Administered 2015-11-12: 1 via ORAL
  Filled 2015-11-12: qty 1

## 2015-11-12 MED ORDER — DOXYCYCLINE HYCLATE 100 MG PO CAPS: 100.0000 mg | ORAL_CAPSULE | Freq: Two times a day (BID) | ORAL | Status: DC

## 2015-11-12 MED ORDER — STERILE WATER FOR INJECTION IJ SOLN
INTRAMUSCULAR | Status: AC
  Administered 2015-11-12: 0.9 mL
  Filled 2015-11-12: qty 10

## 2015-11-12 NOTE — Discharge Instructions (Signed)
Pelvic Inflammatory Disease °Pelvic inflammatory disease (PID) refers to an infection in some or all of the female organs. The infection can be in the uterus, ovaries, fallopian tubes, or the surrounding tissues in the pelvis. PID can cause abdominal or pelvic pain that comes on suddenly (acute pelvic pain). PID is a serious infection because it can lead to lasting (chronic) pelvic pain or the inability to have children (infertility). °CAUSES °This condition is most often caused by an infection that is spread during sexual contact. However, the infection can also be caused by the normal bacteria that are found in the vaginal tissues if these bacteria travel upward into the reproductive organs. PID can also occur following: °· The birth of a baby. °· A miscarriage. °· An abortion. °· Major pelvic surgery. °· The use of an intrauterine device (IUD). °· A sexual assault. °RISK FACTORS °This condition is more likely to develop in women who: °· Are younger than 28 years of age. °· Are sexually active at a young age. °· Use nonbarrier contraception. °· Have multiple sexual partners. °· Have sex with someone who has symptoms of an STD (sexually transmitted disease). °· Use oral contraception. °At times, certain behaviors can also increase the possibility of getting PID, such as: °· Using a vaginal douche. °· Having an IUD in place. °SYMPTOMS °Symptoms of this condition include: °· Abdominal or pelvic pain. °· Fever. °· Chills. °· Abnormal vaginal discharge. °· Abnormal uterine bleeding. °· Unusual pain shortly after the end of a menstrual period. °· Painful urination. °· Pain with sexual intercourse. °· Nausea and vomiting. °DIAGNOSIS °To diagnose this condition, your health care provider will do a physical exam and take your medical history. A pelvic exam typically reveals great tenderness in the uterus and the surrounding pelvic tissues. You may also have tests, such as: °· Lab tests, including a pregnancy test, blood  tests, and urine test. °· Culture tests of the vagina and cervix to check for an STD. °· Ultrasound. °· A laparoscopic procedure to look inside the pelvis. °· Examining vaginal secretions under a microscope. °TREATMENT °Treatment for this condition may involve one or more approaches. °· Antibiotic medicines may be prescribed to be taken by mouth. °· Sexual partners may need to be treated if the infection is caused by an STD. °· For more severe cases, hospitalization may be needed to give antibiotics directly into a vein through an IV tube. °· Surgery may be needed if other treatments do not help, but this is rare. °It may take weeks until you are completely well. If you are diagnosed with PID, you should also be checked for human immunodeficiency virus (HIV). Your health care provider may test you for infection again 3 months after treatment. You should not have unprotected sex. °HOME CARE INSTRUCTIONS °· Take over-the-counter and prescription medicines only as told by your health care provider. °· If you were prescribed an antibiotic medicine, take it as told by your health care provider. Do not stop taking the antibiotic even if you start to feel better. °· Do not have sexual intercourse until treatment is completed or as told by your health care provider. If PID is confirmed, your recent sexual partners will need treatment, especially if you had unprotected sex. °· Keep all follow-up visits as told by your health care provider. This is important. °SEEK MEDICAL CARE IF: °· You have increased or abnormal vaginal discharge. °· Your pain does not improve. °· You vomit. °· You have a fever. °· You   cannot tolerate your medicines.  Your partner has an STD.  You have pain when you urinate. SEEK IMMEDIATE MEDICAL CARE IF:  You have increased abdominal or pelvic pain.  You have chills.  Your symptoms are not better in 72 hours even with treatment.   This information is not intended to replace advice given to  you by your health care provider. Make sure you discuss any questions you have with your health care provider.   Document Released: 08/08/2005 Document Revised: 04/29/2015 Document Reviewed: 09/15/2014 Elsevier Interactive Patient Education 2016 Elsevier Inc.   Pelvic Pain, Female Female pelvic pain can be caused by many different things and start from a variety of places. Pelvic pain refers to pain that is located in the lower half of the abdomen and between your hips. The pain may occur over a short period of time (acute) or may be reoccurring (chronic). The cause of pelvic pain may be related to disorders affecting the female reproductive organs (gynecologic), but it may also be related to the bladder, kidney stones, an intestinal complication, or muscle or skeletal problems. Getting help right away for pelvic pain is important, especially if there has been severe, sharp, or a sudden onset of unusual pain. It is also important to get help right away because some types of pelvic pain can be life threatening.  CAUSES  Below are only some of the causes of pelvic pain. The causes of pelvic pain can be in one of several categories.   Gynecologic.  Pelvic inflammatory disease.  Sexually transmitted infection.  Ovarian cyst or a twisted ovarian ligament (ovarian torsion).  Uterine lining that grows outside the uterus (endometriosis).  Fibroids, cysts, or tumors.  Ovulation.  Pregnancy.  Pregnancy that occurs outside the uterus (ectopic pregnancy).  Miscarriage.  Labor.  Abruption of the placenta or ruptured uterus.  Infection.  Uterine infection (endometritis).  Bladder infection.  Diverticulitis.  Miscarriage related to a uterine infection (septic abortion).  Bladder.  Inflammation of the bladder (cystitis).  Kidney stone(s).  Gastrointestinal.  Constipation.  Diverticulitis.  Neurologic.  Trauma.  Feeling pelvic pain because of mental or emotional causes  (psychosomatic).  Cancers of the bowel or pelvis. EVALUATION  Your caregiver will want to take a careful history of your concerns. This includes recent changes in your health, a careful gynecologic history of your periods (menses), and a sexual history. Obtaining your family history and medical history is also important. Your caregiver may suggest a pelvic exam. A pelvic exam will help identify the location and severity of the pain. It also helps in the evaluation of which organ system may be involved. In order to identify the cause of the pelvic pain and be properly treated, your caregiver may order tests. These tests may include:   A pregnancy test.  Pelvic ultrasonography.  An X-ray exam of the abdomen.  A urinalysis or evaluation of vaginal discharge.  Blood tests. HOME CARE INSTRUCTIONS   Only take over-the-counter or prescription medicines for pain, discomfort, or fever as directed by your caregiver.   Rest as directed by your caregiver.   Eat a balanced diet.   Drink enough fluids to make your urine clear or pale yellow, or as directed.   Avoid sexual intercourse if it causes pain.   Apply warm or cold compresses to the lower abdomen depending on which one helps the pain.   Avoid stressful situations.   Keep a journal of your pelvic pain. Write down when it started, where the pain  is located, and if there are things that seem to be associated with the pain, such as food or your menstrual cycle.  Follow up with your caregiver as directed.  SEEK MEDICAL CARE IF:  Your medicine does not help your pain.  You have abnormal vaginal discharge. SEEK IMMEDIATE MEDICAL CARE IF:   You have heavy bleeding from the vagina.   Your pelvic pain increases.   You feel light-headed or faint.   You have chills.   You have pain with urination or blood in your urine.   You have uncontrolled diarrhea or vomiting.   You have a fever or persistent symptoms for more  than 3 days.  You have a fever and your symptoms suddenly get worse.   You are being physically or sexually abused.   This information is not intended to replace advice given to you by your health care provider. Make sure you discuss any questions you have with your health care provider.   Document Released: 07/05/2004 Document Revised: 04/29/2015 Document Reviewed: 11/28/2011 Elsevier Interactive Patient Education [employer].

## 2015-11-12 NOTE — ED Notes (Signed)
MD at bedside. 

## 2015-11-12 NOTE — ED Provider Notes (Signed)
CSN: [id number]     Arrival date & time 11/11/15  2337 History  By signing my name below, I, [name], attest that this documentation has been prepared under the direction and in the presence of No att. providers found. Electronically Signed: Doran Thompson, ED Scribe. 11/12/2015. 1:34 AM.   Chief Complaint  Patient presents with  . Abdominal Pain   The history is provided by the patient. No language interpreter was used.   HPI Comments:  [name] is a 30 y.o. female who presents to the Emergency Department with a PMHx of gonorrhea and chalmydia complaining of intermittent sharp lower abdominal pain radiating to her back that began today. Pt also reports diarrhea, nausea and mild vaginal discharge. Pt reports her pain is worse with standing. She had 4-5 BM today without any blood or difficulty. Pt denies dysuria, hematuria, rash, urinary urgency, frequency, or any other symptoms at this time.   STD more than one year ago.  Unprotected sex with one partner.   Past Medical History  Diagnosis Date  . Anemia   . Hx of gonorrhea   . Hx of chlamydia infection    Past Surgical History  Procedure Laterality Date  . No past surgeries     Family History  Problem Relation Age of Onset  . Hypertension Mother   . Diabetes Father    Social History  Substance Use Topics  . Smoking status: Never Smoker   . Smokeless tobacco: Never Used  . Alcohol Use: No   OB History    Gravida Para Term Preterm AB TAB SAB Ectopic Multiple Living   6 5 3 2 1  1   0 5     Review of Systems  Gastrointestinal: Positive for nausea, abdominal pain and diarrhea.  Genitourinary: Positive for vaginal discharge. Negative for dysuria, urgency, frequency and hematuria.  Skin: Negative for rash.  All other systems reviewed and are negative.  Allergies  Review of patient's allergies indicates no known allergies.  Home Medications   Prior to Admission medications   Medication Sig Start Date End  Date Taking? Authorizing Provider  acetaminophen (TYLENOL) 500 MG tablet Take 1,000 mg by mouth every 6 (six) hours as needed for mild pain.   Yes Historical Provider, MD  calcium carbonate (TUMS - DOSED IN MG ELEMENTAL CALCIUM) 500 MG chewable tablet Chew 1 tablet by mouth daily.   Yes Historical Provider, MD  ranitidine (ZANTAC) 75 MG tablet Take 75 mg by mouth once.   Yes Historical Provider, MD  doxycycline (VIBRAMYCIN) 100 MG capsule Take 1 capsule (100 mg total) by mouth 2 (two) times daily. 11/12/15   Varney Biles, MD  ibuprofen (ADVIL,MOTRIN) 600 MG tablet Take 1 tablet (600 mg total) by mouth every 6 (six) hours. Patient not taking: Reported on 11/12/2015 12/06/14   Cheri Fowler, MD  oxyCODONE-acetaminophen (PERCOCET/ROXICET) 5-325 MG per tablet Take 1-2 tablets by mouth every 4 (four) hours as needed (for pain scale 4-7). Patient not taking: Reported on 11/12/2015 12/06/14   Cheri Fowler, MD   BP 115/78 mmHg  Pulse 80  Temp(Src) 97.7 F (36.5 C) (Oral)  Resp 16  SpO2 100%  LMP 10/21/2015 (Approximate)   Physical Exam  Constitutional: She is oriented to person, place, and time. She appears well-developed and well-nourished.  HENT:  Head: Normocephalic and atraumatic.  Eyes: Conjunctivae and EOM are normal. Pupils are equal, round, and reactive to light.  Neck: Normal range of motion. Neck supple.  Cardiovascular: Normal rate, regular  rhythm, normal heart sounds and intact distal pulses.   No murmur heard. Pulmonary/Chest: Effort normal. No respiratory distress. She has no wheezes.  Abdominal: Soft. Bowel sounds are normal. She exhibits no distension. There is tenderness. There is no rebound and no guarding.  lower quadrants and suprapubic tenderness; left flank tenderness.   Genitourinary: Vagina normal and uterus normal.  External exam - normal, no lesions Speculum exam: Pt has some yellow discharge, no blood Bimanual exam: Patient has CMT, no adnexal tenderness or fullness  and cervical os is closed  Neurological: She is alert and oriented to person, place, and time.  Skin: Skin is warm and dry.  Psychiatric: She has a normal mood and affect.  Nursing note and vitals reviewed.  ED Course  Procedures   DIAGNOSTIC STUDIES: Oxygen Saturation is 99% on room air, normal by my interpretation.    COORDINATION OF CARE: 1:28 AM Discussed treatment plan with pt at bedside and pt agreed to plan.   Labs Review Labs Reviewed  WET PREP, GENITAL - Abnormal; Notable for the following:    Clue Cells Wet Prep HPF POC PRESENT (*)    WBC, Wet Prep HPF POC RARE (*)    All other components within normal limits  COMPREHENSIVE METABOLIC PANEL - Abnormal; Notable for the following:    Sodium 134 (*)    CO2 21 (*)    Alkaline Phosphatase 36 (*)    Total Bilirubin 1.3 (*)    All other components within normal limits  CBC - Abnormal; Notable for the following:    HCT 34.2 (*)    All other components within normal limits  LIPASE, BLOOD  URINALYSIS, ROUTINE W REFLEX MICROSCOPIC (NOT AT [facility])  I-STAT BETA HCG BLOOD, ED ([facility], [facility], [facility] ONLY)  GC/CHLAMYDIA PROBE AMP ([place]) NOT AT [facility]    Imaging Review No results found. I have personally reviewed and evaluated these images and lab results as part of my medical decision-making.   EKG Interpretation None      MDM   Final diagnoses:  Acute PID (pelvic inflammatory disease)  Pelvic pain in female    Medical screening examination/treatment/procedure(s) were performed by me as the supervising physician. Scribe service was utilized for documentation only.  Pt comes in with cc of lower abd pain. Has PID per exam - agrees to being empirically treated. STD precautions discussed.   Varney Biles, MD 11/12/15 [phone]

## 2015-11-13 LAB — GC/CHLAMYDIA PROBE AMP (~~LOC~~) NOT AT ARMC
Chlamydia: POSITIVE — AB
Neisseria Gonorrhea: NEGATIVE

## 2015-11-16 ENCOUNTER — Telehealth (HOSPITAL_BASED_OUTPATIENT_CLINIC_OR_DEPARTMENT_OTHER): Payer: Self-pay | Admitting: Emergency Medicine

## 2016-03-31 LAB — OB RESULTS CONSOLE RUBELLA ANTIBODY, IGM: RUBELLA: IMMUNE

## 2016-03-31 LAB — OB RESULTS CONSOLE HEPATITIS B SURFACE ANTIGEN: HEP B S AG: NEGATIVE

## 2016-03-31 LAB — OB RESULTS CONSOLE ABO/RH: RH TYPE: POSITIVE

## 2016-03-31 LAB — OB RESULTS CONSOLE RPR: RPR: NONREACTIVE

## 2016-03-31 LAB — OB RESULTS CONSOLE ANTIBODY SCREEN: ANTIBODY SCREEN: NEGATIVE

## 2016-03-31 LAB — OB RESULTS CONSOLE GC/CHLAMYDIA
Chlamydia: NEGATIVE
Gonorrhea: NEGATIVE

## 2016-03-31 LAB — OB RESULTS CONSOLE HIV ANTIBODY (ROUTINE TESTING): HIV: NONREACTIVE

## 2016-08-22 NOTE — L&D Delivery Note (Signed)
Delivery Note Pt had epidural placed but was thought to be in the spinal space and dosed accordingly.  She never got relief, and had another episode of heavier than expected VB so I performed AROM (clear fluid) and placed an FSE.  Within 10 minutes she was completely dilated and wanting to push.  She pushed about 5 minutes and At 6:24 AM a healthy female was delivered via Vaginal, Spontaneous Delivery (Presentation: ROA ).  APGAR: 8, 9; weight pending  .   Placenta status: delivered spontaneously .  Cord:  with the following complications: none .    Anesthesia:  Attempted epidural Episiotomy: None Lacerations: None Suture Repair: n/a Est. Blood Loss (mL): 16ml  Mom to postpartum.  Baby to Couplet care / Skin to Skin.  Logan Bores 10/11/2016, 6:40 AM

## 2016-09-20 LAB — OB RESULTS CONSOLE GBS: GBS: NEGATIVE

## 2016-09-25 ENCOUNTER — Encounter (HOSPITAL_COMMUNITY): Payer: Self-pay | Admitting: *Deleted

## 2016-09-25 ENCOUNTER — Inpatient Hospital Stay (HOSPITAL_COMMUNITY)
Admission: AD | Admit: 2016-09-25 | Discharge: 2016-09-25 | Disposition: A | Discharge status: Home / Self Care | Payer: Medicaid Other | Source: Ambulatory Visit | Attending: Obstetrics and Gynecology | Admitting: Obstetrics and Gynecology

## 2016-09-25 DIAGNOSIS — Z3A00 Weeks of gestation of pregnancy not specified: Secondary | ICD-10-CM | POA: Insufficient documentation

## 2016-09-25 DIAGNOSIS — Z3490 Encounter for supervision of normal pregnancy, unspecified, unspecified trimester: Secondary | ICD-10-CM | POA: Insufficient documentation

## 2016-09-25 NOTE — Discharge Instructions (Signed)
Third Trimester of Pregnancy °The third trimester is from week 29 through week 42, months 7 through 9. This trimester is when your unborn baby (fetus) is growing very fast. At the end of the ninth month, the unborn baby is about 20 inches in length. It weighs about 6-10 pounds. °Follow these instructions at home: °· Avoid all smoking, herbs, and alcohol. Avoid drugs not approved by your doctor. °· Do not use any tobacco products, including cigarettes, chewing tobacco, and electronic cigarettes. If you need help quitting, ask your doctor. You may get counseling or other support to help you quit. °· Only take medicine as told by your doctor. Some medicines are safe and some are not during pregnancy. °· Exercise only as told by your doctor. Stop exercising if you start having cramps. °· Eat regular, healthy meals. °· Wear a good support bra if your breasts are tender. °· Do not use hot tubs, steam rooms, or saunas. °· Wear your seat belt when driving. °· Avoid raw meat, uncooked cheese, and liter boxes and soil used by cats. °· Take your prenatal vitamins. °· Take 1500-2000 milligrams of calcium daily starting at the 20th week of pregnancy until you deliver your baby. °· Try taking medicine that helps you poop (stool softener) as needed, and if your doctor approves. Eat more fiber by eating fresh fruit, vegetables, and whole grains. Drink enough fluids to keep your pee (urine) clear or pale yellow. °· Take warm water baths (sitz baths) to soothe pain or discomfort caused by hemorrhoids. Use hemorrhoid cream if your doctor approves. °· If you have puffy, bulging veins (varicose veins), wear support hose. Raise (elevate) your feet for 15 minutes, 3-4 times a day. Limit salt in your diet. °· Avoid heavy lifting, wear low heels, and sit up straight. °· Rest with your legs raised if you have leg cramps or low back pain. °· Visit your dentist if you have not gone during your pregnancy. Use a soft toothbrush to brush your  teeth. Be gentle when you floss. °· You can have sex (intercourse) unless your doctor tells you not to. °· Do not travel far distances unless you must. Only do so with your doctor's approval. °· Take prenatal classes. °· Practice driving to the hospital. °· Pack your hospital bag. °· Prepare the baby's room. °· Go to your doctor visits. °Get help if: °· You are not sure if you are in labor or if your water has broken. °· You are dizzy. °· You have mild cramps or pressure in your lower belly (abdominal). °· You have a nagging pain in your belly area. °· You continue to feel sick to your stomach (nauseous), throw up (vomit), or have watery poop (diarrhea). °· You have bad smelling fluid coming from your vagina. °· You have pain with peeing (urination). °Get help right away if: °· You have a fever. °· You are leaking fluid from your vagina. °· You are spotting or bleeding from your vagina. °· You have severe belly cramping or pain. °· You lose or gain weight rapidly. °· You have trouble catching your breath and have chest pain. °· You notice sudden or extreme puffiness (swelling) of your face, hands, ankles, feet, or legs. °· You have not felt the baby move in over an hour. °· You have severe headaches that do not go away with medicine. °· You have vision changes. °This information is not intended to replace advice given to you by your health care provider. Make   sure you discuss any questions you have with your health care provider. Document Released: 11/02/2009 Document Revised: 01/14/2016 Document Reviewed: 10/09/2012 Elsevier Interactive Patient Education  2017 [place]. Introduction Patient Name: ________________________________________________ Patient Due Date: ____________________ What is a fetal movement count? A fetal movement count is the number of times that you feel your baby move during a certain amount of time. This may also be called a fetal kick count. A fetal movement count is recommended for  every pregnant woman. You may be asked to start counting fetal movements as early as week 28 of your pregnancy. Pay attention to when your baby is most active. You may notice your baby's sleep and wake cycles. You may also notice things that make your baby move more. You should do a fetal movement count:  When your baby is normally most active.  At the same time each day. A good time to count movements is while you are resting, after having something to eat and drink. How do I count fetal movements? 1. Find a quiet, comfortable area. Sit, or lie down on your side. 2. Write down the date, the start time and stop time, and the number of movements that you felt between those two times. Take this information with you to your health care visits. 3. For 2 hours, count kicks, flutters, swishes, rolls, and jabs. You should feel at least 10 movements during 2 hours. 4. You may stop counting after you have felt 10 movements. 5. If you do not feel 10 movements in 2 hours, have something to eat and drink. Then, keep resting and counting for 1 hour. If you feel at least 4 movements during that hour, you may stop counting. Contact a health care provider if:  You feel fewer than 4 movements in 2 hours.  Your baby is not moving like he or she usually does. Date: ____________ Start time: ____________ Stop time: ____________ Movements: ____________ Date: ____________ Start time: ____________ Stop time: ____________ Movements: ____________ Date: ____________ Start time: ____________ Stop time: ____________ Movements: ____________ Date: ____________ Start time: ____________ Stop time: ____________ Movements: ____________ Date: ____________ Start time: ____________ Stop time: ____________ Movements: ____________ Date: ____________ Start time: ____________ Stop time: ____________ Movements: ____________ Date: ____________ Start time: ____________ Stop time: ____________ Movements: ____________ Date: ____________  Start time: ____________ Stop time: ____________ Movements: ____________ Date: ____________ Start time: ____________ Stop time: ____________ Movements: ____________ This information is not intended to replace advice given to you by your health care provider. Make sure you discuss any questions you have with your health care provider. Document Released: 09/07/2006 Document Revised: 04/06/2016 Document Reviewed: 09/17/2015 Elsevier Interactive Patient Education  2017 [employer] of the uterus can occur throughout pregnancy. Contractions are not always a sign that you are in labor.  WHAT ARE BRAXTON HICKS CONTRACTIONS?  Contractions that occur before labor are called Braxton Hicks contractions, or false labor. Toward the end of pregnancy (32-34 weeks), these contractions can develop more often and may become more forceful. This is not true labor because these contractions do not result in opening (dilatation) and thinning of the cervix. They are sometimes difficult to tell apart from true labor because these contractions can be forceful and people have different pain tolerances. You should not feel embarrassed if you go to the hospital with false labor. Sometimes, the only way to tell if you are in true labor is for your health care provider to look for changes in the cervix. If there  are no prenatal problems or other health problems associated with the pregnancy, it is completely safe to be sent home with false labor and await the onset of true labor. HOW CAN YOU TELL THE DIFFERENCE BETWEEN TRUE AND FALSE LABOR? False Labor   The contractions of false labor are usually shorter and not as hard as those of true labor.   The contractions are usually irregular.   The contractions are often felt in the front of the lower abdomen and in the groin.   The contractions may go away when you walk around or change positions while lying down.   The contractions  get weaker and are shorter lasting as time goes on.   The contractions do not usually become progressively stronger, regular, and closer together as with true labor.  True Labor   Contractions in true labor last 30-70 seconds, become very regular, usually become more intense, and increase in frequency.   The contractions do not go away with walking.   The discomfort is usually felt in the top of the uterus and spreads to the lower abdomen and low back.   True labor can be determined by your health care provider with an exam. This will show that the cervix is dilating and getting thinner.  WHAT TO REMEMBER  Keep up with your usual exercises and follow other instructions given by your health care provider.   Take medicines as directed by your health care provider.   Keep your regular prenatal appointments.   Eat and drink lightly if you think you are going into labor.   If Braxton Hicks contractions are making you uncomfortable:   Change your position from lying down or resting to walking, or from walking to resting.   Sit and rest in a tub of warm water.   Drink 2-3 glasses of water. Dehydration may cause these contractions.   Do slow and deep breathing several times an hour.  WHEN SHOULD I SEEK IMMEDIATE MEDICAL CARE? Seek immediate medical care if:  Your contractions become stronger, more regular, and closer together.   You have fluid leaking or gushing from your vagina.   You have a fever.   You pass blood-tinged mucus.   You have vaginal bleeding.   You have continuous abdominal pain.   You have low back pain that you never had before.   You feel your baby's head pushing down and causing pelvic pressure.   Your baby is not moving as much as it used to.  This information is not intended to replace advice given to you by your health care provider. Make sure you discuss any questions you have with your health care provider. Document  Released: 08/08/2005 Document Revised: 11/30/2015 Document Reviewed: 05/20/2013 Elsevier Interactive Patient Education  2017 [employer].

## 2016-09-25 NOTE — MAU Note (Signed)
Ctxs since 2300. Denies LOF or bleeding. 2.5cm last sve

## 2016-10-04 ENCOUNTER — Telehealth (HOSPITAL_COMMUNITY): Payer: Self-pay | Admitting: *Deleted

## 2016-10-04 NOTE — Telephone Encounter (Signed)
Preadmission screen  

## 2016-10-11 ENCOUNTER — Inpatient Hospital Stay (HOSPITAL_COMMUNITY): Payer: Medicaid Other | Admitting: Anesthesiology

## 2016-10-11 ENCOUNTER — Inpatient Hospital Stay (HOSPITAL_COMMUNITY)
Admission: AD | Admit: 2016-10-11 | Discharge: 2016-10-13 | DRG: 775 | Disposition: A | Discharge status: Home / Self Care | Payer: Medicaid Other | Source: Ambulatory Visit | Attending: Obstetrics and Gynecology | Admitting: Obstetrics and Gynecology

## 2016-10-11 ENCOUNTER — Encounter (HOSPITAL_COMMUNITY): Payer: Self-pay

## 2016-10-11 DIAGNOSIS — Z833 Family history of diabetes mellitus: Secondary | ICD-10-CM | POA: Diagnosis not present

## 2016-10-11 DIAGNOSIS — Z8249 Family history of ischemic heart disease and other diseases of the circulatory system: Secondary | ICD-10-CM | POA: Diagnosis not present

## 2016-10-11 DIAGNOSIS — Z3A38 38 weeks gestation of pregnancy: Secondary | ICD-10-CM

## 2016-10-11 DIAGNOSIS — O894 Spinal and epidural anesthesia-induced headache during the puerperium: Principal | ICD-10-CM | POA: Diagnosis not present

## 2016-10-11 DIAGNOSIS — Z3493 Encounter for supervision of normal pregnancy, unspecified, third trimester: Secondary | ICD-10-CM

## 2016-10-11 LAB — CBC
HCT: 34.7 % — ABNORMAL LOW (ref 36.0–46.0)
HEMOGLOBIN: 12.5 g/dL (ref 12.0–15.0)
MCH: 30.2 pg (ref 26.0–34.0)
MCHC: 36 g/dL (ref 30.0–36.0)
MCV: 83.8 fL (ref 78.0–100.0)
PLATELETS: 221 10*3/uL (ref 150–400)
RBC: 4.14 MIL/uL (ref 3.87–5.11)
RDW: 14.2 % (ref 11.5–15.5)
WBC: 8.4 10*3/uL (ref 4.0–10.5)

## 2016-10-11 LAB — TYPE AND SCREEN
ABO/RH(D): A POS
ANTIBODY SCREEN: NEGATIVE

## 2016-10-11 LAB — RPR: RPR: NONREACTIVE

## 2016-10-11 MED ORDER — ONDANSETRON HCL 4 MG/2ML IJ SOLN: 4.0000 mg | Freq: Four times a day (QID) | INTRAMUSCULAR | Status: DC | PRN

## 2016-10-11 MED ORDER — SENNOSIDES-DOCUSATE SODIUM 8.6-50 MG PO TABS
2.0000 | ORAL_TABLET | ORAL | Status: DC
  Administered 2016-10-11 – 2016-10-12 (×2): 2 via ORAL
  Filled 2016-10-11 (×2): qty 2

## 2016-10-11 MED ORDER — EPHEDRINE 5 MG/ML INJ
10.0000 mg | INTRAVENOUS | Status: DC | PRN
  Filled 2016-10-11: qty 4

## 2016-10-11 MED ORDER — BENZOCAINE-MENTHOL 20-0.5 % EX AERO
1.0000 | INHALATION_SPRAY | CUTANEOUS | Status: DC | PRN
Start: 2016-10-11 — End: 2016-10-13

## 2016-10-11 MED ORDER — PHENYLEPHRINE 40 MCG/ML (10ML) SYRINGE FOR IV PUSH (FOR BLOOD PRESSURE SUPPORT)
80.0000 ug | PREFILLED_SYRINGE | INTRAVENOUS | Status: DC | PRN
  Filled 2016-10-11: qty 5

## 2016-10-11 MED ORDER — DIPHENHYDRAMINE HCL 50 MG/ML IJ SOLN: 12.5000 mg | INTRAMUSCULAR | Status: DC | PRN

## 2016-10-11 MED ORDER — COCONUT OIL OIL: 1.0000 "application " | TOPICAL_OIL | Status: DC | PRN

## 2016-10-11 MED ORDER — TERBUTALINE SULFATE 1 MG/ML IJ SOLN
0.2500 mg | Freq: Once | INTRAMUSCULAR | Status: DC | PRN
  Filled 2016-10-11: qty 1

## 2016-10-11 MED ORDER — LACTATED RINGERS IV SOLN: INTRAVENOUS | Status: DC

## 2016-10-11 MED ORDER — BUTORPHANOL TARTRATE 1 MG/ML IJ SOLN: 1.0000 mg | INTRAMUSCULAR | Status: DC | PRN

## 2016-10-11 MED ORDER — OXYTOCIN BOLUS FROM INFUSION
500.0000 mL | Freq: Once | INTRAVENOUS | Status: AC
  Administered 2016-10-11: 500 mL via INTRAVENOUS

## 2016-10-11 MED ORDER — OXYTOCIN 40 UNITS IN LACTATED RINGERS INFUSION - SIMPLE MED
2.5000 [IU]/h | INTRAVENOUS | Status: DC
  Filled 2016-10-11: qty 1000

## 2016-10-11 MED ORDER — IBUPROFEN 600 MG PO TABS
600.0000 mg | ORAL_TABLET | Freq: Four times a day (QID) | ORAL | Status: DC
  Administered 2016-10-11 – 2016-10-13 (×9): 600 mg via ORAL
  Filled 2016-10-11 (×11): qty 1

## 2016-10-11 MED ORDER — OXYCODONE HCL 5 MG PO TABS
10.0000 mg | ORAL_TABLET | ORAL | Status: DC | PRN
  Administered 2016-10-11 (×2): 10 mg via ORAL
  Filled 2016-10-11 (×2): qty 2

## 2016-10-11 MED ORDER — FENTANYL 2.5 MCG/ML BUPIVACAINE 1/10 % EPIDURAL INFUSION (WH - ANES)
14.0000 mL/h | INTRAMUSCULAR | Status: DC | PRN
  Administered 2016-10-11: 3 mL/h via EPIDURAL
  Filled 2016-10-11: qty 100

## 2016-10-11 MED ORDER — SODIUM BICARBONATE 8.4 % IV SOLN
INTRAVENOUS | Status: DC | PRN
  Administered 2016-10-11: 3 mL via EPIDURAL

## 2016-10-11 MED ORDER — OXYCODONE-ACETAMINOPHEN 5-325 MG PO TABS: 1.0000 | ORAL_TABLET | ORAL | Status: DC | PRN

## 2016-10-11 MED ORDER — SOD CITRATE-CITRIC ACID 500-334 MG/5ML PO SOLN
30.0000 mL | ORAL | Status: DC | PRN
Start: 2016-10-11 — End: 2016-10-11

## 2016-10-11 MED ORDER — ZOLPIDEM TARTRATE 5 MG PO TABS: 5.0000 mg | ORAL_TABLET | Freq: Every evening | ORAL | Status: DC | PRN

## 2016-10-11 MED ORDER — SOD CITRATE-CITRIC ACID 500-334 MG/5ML PO SOLN: 30.0000 mL | ORAL | Status: DC | PRN

## 2016-10-11 MED ORDER — LACTATED RINGERS IV SOLN
INTRAVENOUS | Status: DC
  Administered 2016-10-11: 04:00:00 via INTRAVENOUS

## 2016-10-11 MED ORDER — ACETAMINOPHEN 325 MG PO TABS
650.0000 mg | ORAL_TABLET | ORAL | Status: DC | PRN
  Administered 2016-10-12 (×2): 650 mg via ORAL
  Filled 2016-10-11 (×2): qty 2

## 2016-10-11 MED ORDER — OXYTOCIN BOLUS FROM INFUSION: 500.0000 mL | Freq: Once | INTRAVENOUS | Status: DC

## 2016-10-11 MED ORDER — LACTATED RINGERS IV SOLN: 500.0000 mL | INTRAVENOUS | Status: DC | PRN

## 2016-10-11 MED ORDER — PRENATAL MULTIVITAMIN CH
1.0000 | ORAL_TABLET | Freq: Every day | ORAL | Status: DC
  Administered 2016-10-11 – 2016-10-13 (×3): 1 via ORAL
  Filled 2016-10-11 (×3): qty 1

## 2016-10-11 MED ORDER — OXYTOCIN 40 UNITS IN LACTATED RINGERS INFUSION - SIMPLE MED: 2.5000 [IU]/h | INTRAVENOUS | Status: DC

## 2016-10-11 MED ORDER — TETANUS-DIPHTH-ACELL PERTUSSIS 5-2.5-18.5 LF-MCG/0.5 IM SUSP: 0.5000 mL | Freq: Once | INTRAMUSCULAR | Status: DC

## 2016-10-11 MED ORDER — SIMETHICONE 80 MG PO CHEW: 80.0000 mg | CHEWABLE_TABLET | ORAL | Status: DC | PRN

## 2016-10-11 MED ORDER — FLEET ENEMA 7-19 GM/118ML RE ENEM: 1.0000 | ENEMA | RECTAL | Status: DC | PRN

## 2016-10-11 MED ORDER — OXYCODONE-ACETAMINOPHEN 5-325 MG PO TABS: 2.0000 | ORAL_TABLET | ORAL | Status: DC | PRN

## 2016-10-11 MED ORDER — OXYCODONE HCL 5 MG PO TABS
5.0000 mg | ORAL_TABLET | ORAL | Status: DC | PRN
  Administered 2016-10-12 (×2): 5 mg via ORAL
  Filled 2016-10-11 (×2): qty 1

## 2016-10-11 MED ORDER — OXYTOCIN 40 UNITS IN LACTATED RINGERS INFUSION - SIMPLE MED: 1.0000 m[IU]/min | INTRAVENOUS | Status: DC

## 2016-10-11 MED ORDER — LACTATED RINGERS IV SOLN
500.0000 mL | INTRAVENOUS | Status: DC | PRN
  Administered 2016-10-11: 500 mL via INTRAVENOUS

## 2016-10-11 MED ORDER — LIDOCAINE HCL (PF) 1 % IJ SOLN
30.0000 mL | INTRAMUSCULAR | Status: DC | PRN
  Filled 2016-10-11: qty 30

## 2016-10-11 MED ORDER — PHENYLEPHRINE 40 MCG/ML (10ML) SYRINGE FOR IV PUSH (FOR BLOOD PRESSURE SUPPORT)
80.0000 ug | PREFILLED_SYRINGE | INTRAVENOUS | Status: DC | PRN
  Filled 2016-10-11: qty 10
  Filled 2016-10-11: qty 5

## 2016-10-11 MED ORDER — ACETAMINOPHEN 325 MG PO TABS: 650.0000 mg | ORAL_TABLET | ORAL | Status: DC | PRN

## 2016-10-11 MED ORDER — ONDANSETRON HCL 4 MG PO TABS: 4.0000 mg | ORAL_TABLET | ORAL | Status: DC | PRN

## 2016-10-11 MED ORDER — DIPHENHYDRAMINE HCL 25 MG PO CAPS: 25.0000 mg | ORAL_CAPSULE | Freq: Four times a day (QID) | ORAL | Status: DC | PRN

## 2016-10-11 MED ORDER — DIBUCAINE 1 % RE OINT: 1.0000 "application " | TOPICAL_OINTMENT | RECTAL | Status: DC | PRN

## 2016-10-11 MED ORDER — ONDANSETRON HCL 4 MG/2ML IJ SOLN: 4.0000 mg | INTRAMUSCULAR | Status: DC | PRN

## 2016-10-11 MED ORDER — LACTATED RINGERS IV SOLN: 500.0000 mL | Freq: Once | INTRAVENOUS | Status: DC

## 2016-10-11 MED ORDER — LIDOCAINE HCL (PF) 1 % IJ SOLN: 30.0000 mL | INTRAMUSCULAR | Status: DC | PRN

## 2016-10-11 MED ORDER — WITCH HAZEL-GLYCERIN EX PADS: 1.0000 "application " | MEDICATED_PAD | CUTANEOUS | Status: DC | PRN

## 2016-10-11 NOTE — Anesthesia Postprocedure Evaluation (Signed)
Anesthesia Post Note  Patient: [name]  Procedure(s) Performed: * No procedures listed *  Patient location during evaluation: Mother Baby Anesthesia Type: Epidural Level of consciousness: awake and alert Pain management: pain level controlled Vital Signs Assessment: post-procedure vital signs reviewed and stable Respiratory status: spontaneous breathing Cardiovascular status: stable Postop Assessment: no backache, no headache, patient able to bend at knees, no signs of nausea or vomiting and adequate PO intake Anesthetic complications: no        Last Vitals:  Vitals:   10/11/16 0959 10/11/16 1102  BP: 108/65 (!) 101/53  Pulse: 82 78  Resp: 19 19  Temp: 36.8 C 36.4 C    Last Pain:  Vitals:   10/11/16 1108  TempSrc:   PainSc: 8    Pain Goal:                 [name]

## 2016-10-11 NOTE — MAU Note (Signed)
Pt presents complaining of vaginal bleeding. States she was checked yesterday in the office and when she woke up this morning she was having bleeding.

## 2016-10-11 NOTE — H&P (Signed)
[name] is a 31 y.o. female [id number] at 33 6/7 weeks (EDD 10/19/16 by LMP c/w 11 week [place]) presenting for active labor and VB.  Pt began having contractions earlier in evening, and they are now q 2-3 minutes, called when passed some clots in toilet and was instructed to come immediately to hospital.  Pt was 6cm on arrival with BBOW.  Prenatal care complicated by fetal pyelectasis and low lying placenta that resolved on f/u US at 33 weeks.    She is on valtrex suppression for h/o +GBS. She had a prior PTD at 35 weeks, but then two term deliveries after that, so elected not to do 17-P.  OB History    Gravida Para Term Preterm AB Living   7 5 3 2 1 5    SAB TAB Ectopic Multiple Live Births   1     0 5    2008 NSVD 37 weeks 5#8oz 2010 SAB x 1 2012 NSVD 35 weeks 5#7oz 2015 NSVD 37 weeks 5#4oz 2016 NSVD 37 weeks 6#10oz  Past Medical History:  Diagnosis Date  . Anemia   . Hx of chlamydia infection   . Hx of gonorrhea    Past Surgical History:  Procedure Laterality Date  . NO PAST SURGERIES     Family History: family history includes Diabetes in her father; Hypertension in her mother. Social History:  reports that she has never smoked. She has never used smokeless tobacco. She reports that she does not drink alcohol or use drugs.     Maternal Diabetes: No Genetic Screening: Normal Maternal Ultrasounds/Referrals: Abnormal:  Findings:   Fetal renal pyelectasis, Other: Low lying placenta   BOTH resolved on f/u scan at 32 weeks Fetal Ultrasounds or other Referrals:  None Maternal Substance Abuse:  No Significant Maternal Medications:  Meds include: Other: valtrex Significant Maternal Lab Results:  None Other Comments:  None  Review of Systems  Gastrointestinal: Positive for abdominal pain.   Maternal Medical History:  Reason for admission: Contractions.   Contractions: Onset was 3-5 hours ago.   Frequency: regular.   Perceived severity is strong.    Fetal activity:  Perceived fetal activity is normal.    Prenatal Complications - Diabetes: none.    Dilation: 7 Effacement (%): 100 Station: -1 Exam by:: Dr. Marvel Plan  Blood pressure (!) 139/91, pulse 77, temperature 97.2 F (36.2 C), temperature source Oral, resp. rate 18, last menstrual period 10/21/2015, unknown if currently breastfeeding. Maternal Exam:  Uterine Assessment: Contraction strength is firm.  Contraction frequency is regular.   Abdomen: Patient reports no abdominal tenderness. Fetal presentation: vertex  Introitus: Normal vulva. Normal vagina.  Pelvis: adequate for delivery.      Physical Exam  Constitutional: She appears well-developed.  Cardiovascular: Normal rate and regular rhythm.   Respiratory: Effort normal.  GI: Soft.  Genitourinary: Vagina normal.  Neurological: She is alert.  Psychiatric: She has a normal mood and affect.   Cervix 90/7/-1 BBOW Prenatal labs: ABO, Rh: A/Positive/-- (08/10 0000) Antibody: Negative (08/10 0000) Rubella: Immune (08/10 0000) RPR: Nonreactive (08/10 0000)  HBsAg: Negative (08/10 0000)  HIV: Non-reactive (08/10 0000)  GBS: Negative (01/30 0000)  First trimester screen negative One hour GCT 54 Essential panel negative  Assessment/Plan: Pt admitted in active labor and requestiing epidural. Will try to get on board.   Pt had exxpressed interest in BTL but did not sign papers until 09/19/16.  Will d/w her interval BTL.    Logan Bores 10/11/2016, 4:01 AM

## 2016-10-11 NOTE — Anesthesia Preprocedure Evaluation (Signed)
Anesthesia Evaluation  Patient identified by MRN, date of birth, ID band Patient awake    Reviewed: Allergy & Precautions, Patient's Chart, lab work & pertinent test results  History of Anesthesia Complications Negative for: history of anesthetic complications  Airway Mallampati: II  TM Distance: >3 FB Neck ROM: Full    Dental  (+) Teeth Intact   Pulmonary neg pulmonary ROS,    breath sounds clear to auscultation       Cardiovascular negative cardio ROS   Rhythm:Regular     Neuro/Psych negative neurological ROS  negative psych ROS   GI/Hepatic negative GI ROS, Neg liver ROS,   Endo/Other  negative endocrine ROS  Renal/GU negative Renal ROS     Musculoskeletal   Abdominal   Peds  Hematology  (+) anemia ,   Anesthesia Other Findings   Reproductive/Obstetrics (+) Pregnancy                             Anesthesia Physical Anesthesia Plan  ASA: II  Anesthesia Plan: Epidural   Post-op Pain Management:    Induction:   Airway Management Planned:   Additional Equipment:   Intra-op Plan:   Post-operative Plan:   Informed Consent: I have reviewed the patients History and Physical, chart, labs and discussed the procedure including the risks, benefits and alternatives for the proposed anesthesia with the patient or authorized representative who has indicated his/her understanding and acceptance.     Plan Discussed with: Anesthesiologist  Anesthesia Plan Comments:         Anesthesia Quick Evaluation

## 2016-10-11 NOTE — Anesthesia Procedure Notes (Signed)
Epidural Patient location during procedure: OB Start time: 10/11/2016 4:31 AM End time: 10/11/2016 4:48 AM  Staffing Anesthesiologist: Oleta Mouse Performed: anesthesiologist   Preanesthetic Checklist Completed: patient identified, surgical consent, pre-op evaluation, timeout performed, IV checked, risks and benefits discussed and monitors and equipment checked  Epidural Patient position: sitting Prep: DuraPrep Patient monitoring: heart rate, cardiac monitor, continuous pulse ox and blood pressure Approach: midline Location: L3-L4 Injection technique: LOR saline  Needle:  Needle type: Tuohy  Needle gauge: 17 G Needle length: 9 cm Needle insertion depth: 4 cm Catheter type: closed end flexible Catheter size: 19 Gauge Catheter at skin depth: 12 cm Test dose: negative and 2% lidocaine with Epi 1:200 K  Assessment Events: blood not aspirated, injection not painful, no injection resistance, negative IV test and no paresthesia  Additional Notes -Procedure at L4/5 however unable to advance catheter -Procedure at L3/4 with LOR at 4cm, catheter difficult to insert at first, negative aspiration but early sensory deficit with test dose, subsequent aspirations positive for CSF, patient educated of likely spinal catheter and dosing adjusted, L+D team notified. Reason for block:procedure for pain

## 2016-10-11 NOTE — Lactation Note (Signed)
This note was copied from a baby's chart. Lactation Consultation Note  Patient Name: [name] [id number] Date: 10/11/2016 Reason for consult: Initial assessment Mom reports baby latched well this morning. Asleep at this visit. Encouraged to BF with feeding ques. Lactation brochure left for review, advised of OP services and support group. Mom is experienced BF, denies questions/concerns. Reviewed positioning with Mom. Mom reported on admission she plans to BR/BO. LC encouraged Mom to exclusively BF the 1st few weeks to establish milk supply and baby's latch unless medically necessary to supplement. Mom reports this is her plan. Encouraged Mom to call for latch check or for assist as desired.   Maternal Data Has patient been taught Hand Expression?: Yes Does the patient have breastfeeding experience prior to this delivery?: Yes  Feeding    LATCH Score/Interventions                      Lactation Tools Discussed/Used WIC Program: Yes   Consult Status Consult Status: Follow-up Date: 10/12/16 Follow-up type: In-patient    Katrine Coho 10/11/2016, 12:03 PM

## 2016-10-11 NOTE — MAU Note (Signed)
Vertex presentation confirmed by u/s

## 2016-10-11 NOTE — Progress Notes (Signed)
Called to evaluate pt who is having a headache.  She had a labor epidural placed this morning prior to a NSVD.  During placement of the epidural, there was a dural puncture with +CSF.  After delivery, the catheter was removed.    The patient describes her headache to encompass her entire head and radiates into her neck and left shoulder.  She states that the pain is 7/10 intensity.  She is sitting upright in bed with room lights on and easily engaging in conversation.    While a post-dural puncture headache is possible, she does not give a classic description of this.  This was explained to her and she understood.  She was advised that drinking caffeine and lying flat would be some methods that can help alleviate a post dural puncture headache.

## 2016-10-11 NOTE — Progress Notes (Signed)
Epidural catheter removed per order.  Blue tip intact.  No epidural charted to document on.  Carter Kitten RNC

## 2016-10-12 ENCOUNTER — Inpatient Hospital Stay (HOSPITAL_COMMUNITY): Admission: RE | Admit: 2016-10-12 | Payer: No Typology Code available for payment source | Source: Ambulatory Visit

## 2016-10-12 LAB — CBC
HEMATOCRIT: 28.2 % — AB (ref 36.0–46.0)
HEMOGLOBIN: 10.1 g/dL — AB (ref 12.0–15.0)
MCH: 29.9 pg (ref 26.0–34.0)
MCHC: 35.8 g/dL (ref 30.0–36.0)
MCV: 83.4 fL (ref 78.0–100.0)
Platelets: 178 10*3/uL (ref 150–400)
RBC: 3.38 MIL/uL — AB (ref 3.87–5.11)
RDW: 14.4 % (ref 11.5–15.5)
WBC: 12 10*3/uL — ABNORMAL HIGH (ref 4.0–10.5)

## 2016-10-12 MED ORDER — CAFFEINE-SODIUM BENZOATE 125-125 MG/ML IJ SOLN: 500.0000 mg | Freq: Once | INTRAMUSCULAR | Status: DC

## 2016-10-12 MED ORDER — CAFFEINE-SODIUM BENZOATE 125-125 MG/ML IJ SOLN
500.0000 mg | Freq: Once | INTRAMUSCULAR | Status: AC
  Administered 2016-10-12: 500 mg via INTRAVENOUS
  Filled 2016-10-12: qty 2

## 2016-10-12 MED ORDER — LACTATED RINGERS IV SOLN
Freq: Once | INTRAVENOUS | Status: AC
  Administered 2016-10-12: 10:00:00 via INTRAVENOUS

## 2016-10-12 MED ORDER — CYCLOBENZAPRINE HCL 5 MG PO TABS
5.0000 mg | ORAL_TABLET | Freq: Three times a day (TID) | ORAL | Status: DC | PRN
  Administered 2016-10-12 – 2016-10-13 (×3): 5 mg via ORAL
  Filled 2016-10-12 (×4): qty 1

## 2016-10-12 NOTE — Lactation Note (Signed)
This note was copied from a baby's chart. Lactation Consultation Note: Mother attempting to breastfeed infant . Infant very sleepy. Infant roused with baby exercises and cool cloth. Infant latched on and off for 10 mins.  Infant fed 8 ml formula by LC. Mother was sat up with DEBP and advised to post pump after feeding attempts. Suggested that mother offer breast with feeding cues and at least 8-12 times daily. If infant doesn't breastfeed well, Mother to then offer EBM/, or formula if BM unavailable. Mother receptive to plan . Mother given supplemental guidelines.  Patient Name: [name] [id number] Date: 10/12/2016 Reason for consult: Follow-up assessment   Maternal Data    Feeding Feeding Type: Breast Fed Nipple Type: Slow - flow Length of feed: 10 min (on and off with a few good sucks.)  LATCH Score/Interventions Latch: Grasps breast easily, tongue down, lips flanged, rhythmical sucking.  Audible Swallowing: A few with stimulation Intervention(s): Skin to skin  Type of Nipple: Everted at rest and after stimulation  Comfort (Breast/Nipple): Soft / non-tender     Hold (Positioning): Assistance needed to correctly position infant at breast and maintain latch.  LATCH Score: 8  Lactation Tools Discussed/Used     Consult Status      Darla Lesches 10/12/2016, 12:47 PM

## 2016-10-12 NOTE — Progress Notes (Signed)
Feeling a little better with medical treatment for headache, will continue observation overnight

## 2016-10-12 NOTE — Addendum Note (Signed)
Addendum  created 10/12/16 1326 by Lyn Hollingshead, MD   Order list changed

## 2016-10-12 NOTE — Progress Notes (Signed)
PPD #1 Doing ok, still has headache and neck pain, worse when up, rates 7/10 Afeb, VSS Fundus firm, NT at U-1 Continue routine postpartum care, will have anesthesia re-evaluate for probable spinal headache

## 2016-10-13 ENCOUNTER — Inpatient Hospital Stay (HOSPITAL_COMMUNITY): Payer: Medicaid Other | Admitting: Anesthesiology

## 2016-10-13 MED ORDER — IBUPROFEN 600 MG PO TABS: 600.0000 mg | ORAL_TABLET | Freq: Four times a day (QID) | ORAL | 1 refills | Status: DC | PRN

## 2016-10-13 MED ORDER — CYCLOBENZAPRINE HCL 5 MG PO TABS: 5.0000 mg | ORAL_TABLET | Freq: Three times a day (TID) | ORAL | 1 refills | Status: DC | PRN

## 2016-10-13 NOTE — Progress Notes (Signed)
Discharge instructions given, questions answered, states understanding, signed and given copy. 

## 2016-10-13 NOTE — Progress Notes (Signed)
Patient ID: [name], female   DOB: [birth year], 31 y.o.   MRN: [id number] Pt reports continued headache; worse when sits up in bed. She is currently leaning back on pillows. She states flexeril did not give any relief; caffeine mildly took edge off. She is doing well otherwise. No scotomata, Cp, fever or chills. She has ambulated with no dizziness. She is tolerating diet and voiding well. She is bonding well with baby; breastfeeding. Pt would like discharge to home today if headache relieved.  VSS ABD - FF  EXT - no Homans  Hg: 10.1  A/P: PPD#1 s/p svd - headache otherwise doing well           Discussed with Dr Azalee Course - will see pt for possible                Blood patch          If stable later today then will discharge to home          Routine pp care at this time

## 2016-10-13 NOTE — Progress Notes (Signed)
Patient ID: [name], female   DOB: 1986/08/06, 31 y.o.   MRN: [id number] Pt reports resolution of headache with blood patch. Feels great and requests discharge to home tonight.  Sitting up in bed eating dinner. Reviewed discharge instructions  Plans on interval BTL

## 2016-10-13 NOTE — Discharge Instructions (Signed)
Home Care Instructions for Mom °Introduction ° ACTIVITY °· Gradually return to your regular activities. °· Let yourself rest. Nap while your baby sleeps. °· Avoid lifting anything that is heavier than 10 lb (4.5 kg) until your health care provider says it is okay. °· Avoid activities that take a lot of effort and energy (are strenuous) until approved by your health care provider. Walking at a slow-to-moderate pace is usually safe. °· If you had a cesarean delivery: °¨ Do not vacuum, climb stairs, or drive a car for 4-6 weeks. °¨ Have someone help you at home until you feel like you can do your usual activities yourself. °¨ Do exercises as told by your health care provider, if this applies. °VAGINAL BLEEDING °You may continue to bleed for 4-6 weeks after delivery. Over time, the amount of blood usually decreases and the color of the blood usually gets lighter. However, the flow of bright red blood may increase if you have been too active. If you need to use more than one pad in an hour because your pad gets soaked, or if you pass a large clot: °· Lie down. °· Raise your feet. °· Place a cold compress on your lower abdomen. °· Rest. °· Call your health care provider. °If you are breastfeeding, your period should return anytime between 8 weeks after delivery and the time that you stop breastfeeding. If you are not breastfeeding, your period should return 6-8 weeks after delivery. °PERINEAL CARE °The perineal area, or perineum, is the part of your body between your thighs. After delivery, this area needs special care. Follow these instructions as told by your health care provider. °· Take warm tub baths for 15-20 minutes. °· Use medicated pads and pain-relieving sprays and creams as told. °· Do not use tampons or douches until vaginal bleeding has stopped. °· Each time you go to the bathroom: °¨ Use a peri bottle. °¨ Change your pad. °¨ Use towelettes in place of toilet paper until your stitches have healed. °· Do Kegel  exercises every day. Kegel exercises help to maintain the muscles that support the vagina, bladder, and bowels. You can do these exercises while you are standing, sitting, or lying down. To do Kegel exercises: °¨ Tighten the muscles of your abdomen and the muscles that surround your birth canal. °¨ Hold for a few seconds. °¨ Relax. °¨ Repeat until you have done this 5 times in a row. °· To prevent hemorrhoids from developing or getting worse: °¨ Drink enough fluid to keep your urine clear or pale yellow. °¨ Avoid straining when having a bowel movement. °¨ Take over-the-counter medicines and stool softeners as told by your health care provider. °BREAST CARE °· Wear a tight-fitting bra. °· Avoid taking over-the-counter pain medicine for breast discomfort. °· Apply ice to the breasts to help with discomfort as needed: °¨ Put ice in a plastic bag. °¨ Place a towel between your skin and the bag. °¨ Leave the ice on for 20 minutes or as told by your health care provider. °NUTRITION °· Eat a well-balanced diet. °· Do not try to lose weight quickly by cutting back on calories. °· Take your prenatal vitamins until your postpartum checkup or until your health care provider tells you to stop. °POSTPARTUM DEPRESSION °You may find yourself crying for no apparent reason and unable to cope with all of the changes that come with having a newborn. This mood is called postpartum depression. Postpartum depression happens because your hormone levels change after   delivery. If you have postpartum depression, get support from your partner, friends, and family. If the depression does not go away on its own after several weeks, contact your health care provider. BREAST SELF-EXAM Do a breast self-exam each month, at the same time of the month. If you are breastfeeding, check your breasts just after a feeding, when your breasts are less full. If you are breastfeeding and your period has started, check your breasts on day 5, 6, or 7 of your  period. Report any lumps, bumps, or discharge to your health care provider. Know that breasts are normally lumpy if you are breastfeeding. This is temporary, and it is not a health risk. INTIMACY AND SEXUALITY Avoid sexual activity for at least 3-4 weeks after delivery or until the brownish-red vaginal flow is completely gone. If you want to avoid pregnancy, use some form of birth control. You can get pregnant after delivery, even if you have not had your period. SEEK MEDICAL CARE IF:  You feel unable to cope with the changes that a child brings to your life, and these feelings do not go away after several weeks.  You notice a lump, a bump, or discharge on your breast. SEEK IMMEDIATE MEDICAL CARE IF:  Blood soaks your pad in 1 hour or less.  You have:  Severe pain or cramping in your lower abdomen.  A bad-smelling vaginal discharge.  A fever that is not controlled by medicine.  A fever, and an area of your breast is red and sore.  Pain or redness in your calf.  Sudden, severe chest pain.  Shortness of breath.  Painful or bloody urination.  Problems with your vision.  You vomit for 12 hours or longer.  You develop a severe headache.  You have serious thoughts about hurting yourself, your child, or anyone else. This information is not intended to replace advice given to you by your health care provider. Make sure you discuss any questions you have with your health care provider. Document Released: 08/05/2000 Document Revised: 01/14/2016 Document Reviewed: 02/09/2015  2017 Elsevier   Natural Childbirth Natural childbirth is going through labor and delivery without any drugs to relieve pain. Additionally, fetal monitors are not used, a delivery is not done, and a surgical cut to enlarge the vaginal opening (episiotomy) is not made. With the help of a birthing professional (midwife or health care provider), you direct your own labor and delivery. Many women choose natural  childbirth because it makes them feel more in control and in touch with their labor and delivery. Some woman also choose natural childbirth because they are concerned about medicines affecting them and their babies. Pregnant women with a high-risk pregnancy should not attempt natural childbirth. It is better to deliver the infant in a hospital if an emergency situation arises. Sometimes, a health care provider has to get involved for the health and safety of the mother and infant. Techniques for natural childbirth  The Lamaze method--This method teaches parents that having a baby is normal, healthy, and natural. It also teaches the mother to take a neutral position regarding pain medicine and numbing medicines and to make an informed decision about using these medicines when the time comes.  The Hulan Fray (also called husband-coached birth)--This method teaches the father or partner to be the birth coach. It encourages the mother to exercise and eat a balanced, nutritious diet. It also involves relaxation and deep breathing exercises and preparing the parents for emergency situations. Methods of dealing with  labor pain and delivery  Meditation.  Yoga.  Hypnosis.  Acupuncture.  Massage.  Changing positions (walking, rocking, showering, leaning on birth balls).  Lying in warm water or a whirlpool bath.  Finding an activity that keeps your mind off of the labor pain.  Listening to soft music.  Focusing on a particular object (visual imagery). Before going into labor  Be sure you and your spouse or partner are in agreement about having a natural childbirth.  Decide if your health care provider or a midwife will deliver your baby.  Decide if you will have your baby in the hospital, at a birthing center, or at home.  If you have children, make plans to have someone take care of them when you go to the hospital or birthing center.  Know the distance and the time it takes to go to  the hospital or birthing center. Practice going there and time it to be sure.  Have a bag packed with a nightgown, bathrobe, and toiletries. Be ready to take it with you when you go into labor.  Keep phone numbers of your family and friends handy if you need to call someone when you go into labor.  Your spouse or partner should go to all the natural childbirth technique classes.  Talk with your health care provider about the possibility of a medical emergency and what will happen if that occurs. Advantages of natural childbirth  You are in control of your labor and delivery.  You will not take medicines that could affect you and the baby.  There are no invasive procedures, such as an episiotomy.  You and your spouse or partner will work together, which can increase your bond with each other.  In most delivery centers, your family and friends can be involved in the labor and delivery process. Disadvantages of natural childbirth  You will experience pain during your labor and delivery.  The methods of helping relieve your labor pains may not work for you.  You may feel disappointed if you decide to change your mind during labor and not have a natural childbirth. After the delivery  You will be very tired.  You will be uncomfortable because of your uterus contracting. You will feel soreness around the vagina.  You may feel cold and shaky. This is a natural reaction. This information is not intended to replace advice given to you by your health care provider. Make sure you discuss any questions you have with your health care provider. Document Released: 07/21/2008 Document Revised: 01/14/2016 Document Reviewed: 04/15/2013 Elsevier Interactive Patient Education  2017 [employer]. Nothing in vagina for 6 weeks.  No sex, tampons, and douching.  Other instructions as in [employer].

## 2016-10-13 NOTE — Anesthesia Preprocedure Evaluation (Addendum)
Anesthesia Evaluation  Patient identified by MRN, date of birth, ID band Patient awake    Reviewed: Allergy & Precautions, Patient's Chart, lab work & pertinent test results  History of Anesthesia Complications Negative for: history of anesthetic complications  Airway Mallampati: II  TM Distance: >3 FB Neck ROM: Full    Dental  (+) Teeth Intact   Pulmonary neg pulmonary ROS,    breath sounds clear to auscultation       Cardiovascular negative cardio ROS   Rhythm:Regular     Neuro/Psych negative neurological ROS  negative psych ROS   GI/Hepatic negative GI ROS, Neg liver ROS,   Endo/Other  negative endocrine ROS  Renal/GU negative Renal ROS     Musculoskeletal   Abdominal   Peds  Hematology  (+) anemia ,   Anesthesia Other Findings   Reproductive/Obstetrics (+) Pregnancy                             Anesthesia Physical  Anesthesia Plan  ASA: II  Anesthesia Plan:    Post-op Pain Management:    Induction:   Airway Management Planned:   Additional Equipment:   Intra-op Plan:   Post-operative Plan:   Informed Consent: I have reviewed the patients History and Physical, chart, labs and discussed the procedure including the risks, benefits and alternatives for the proposed anesthesia with the patient or authorized representative who has indicated his/her understanding and acceptance.     Plan Discussed with: Anesthesiologist  Anesthesia Plan Comments: (Epidural blood patch for post dural puncture headache)       Anesthesia Quick Evaluation

## 2016-10-13 NOTE — Anesthesia Procedure Notes (Signed)
Epidural Patient location during procedure: holding area Start time: 10/13/2016 10:33 AM End time: 10/13/2016 10:43 AM  Staffing Anesthesiologist: Montez Hageman Performed: anesthesiologist   Preanesthetic Checklist Completed: patient identified, site marked, surgical consent, pre-op evaluation, timeout performed, IV checked, risks and benefits discussed and monitors and equipment checked  Epidural Patient position: sitting Prep: ChloraPrep Patient monitoring: heart rate, cardiac monitor, continuous pulse ox and blood pressure Approach: midline Location: L3-L4 Injection technique: LOR saline  Needle:  Needle type: Tuohy  Needle gauge: 18 G Needle insertion depth: 8 cm  Assessment Events: blood not aspirated, injection not painful, no injection resistance and no paresthesia  Additional Notes 20 cc of autologous blood injected into epidural space in 5cc aliquots. Tolerated wellReason for block:Epidural Blood Patch

## 2016-10-13 NOTE — Lactation Note (Signed)
This note was copied from a baby's chart. Lactation Consultation Note: Mother reports that she was pumping until she began having a headache. She states she had to lye down and couldn't pump.  She reports that she is mostly breastfeeding infant and then supplementing with formula. Mother reports that infant is breastfeeding well.  Mother has an electric Even Flow pump at home. Advised mother to post pump after feedings when feeling better. Mother is aware of good breast massage and infant feeding well to prevent engorgement.  Mother informed of available Lactation services.   Patient Name: Girl Randilynn Fraze [id number] Date: 10/13/2016     Maternal Data Formula Feeding for Exclusion: No  Feeding    LATCH Score/Interventions                      Lactation Tools Discussed/Used     Consult Status      Darla Lesches 10/13/2016, 9:44 AM

## 2016-10-13 NOTE — Discharge Summary (Signed)
OB Discharge Summary     Patient Name: [name] DOB: 09-09-1985 MRN: [id number]  Date of admission: 10/11/2016 Delivering MD: Paula Compton   Date of discharge: 10/13/2016  Admitting diagnosis: [redacted]w[redacted]d, CTX and passing blood clots  Intrauterine pregnancy: [redacted]w[redacted]d     Secondary diagnosis:  Active Problems:   Normal labor   Normal pregnancy in third trimester   NSVD (normal spontaneous vaginal delivery)  Additional problems: spinal headache     Discharge diagnosis: Term Pregnancy Delivered                                                                                                Post partum procedures:blood patch  Augmentation: AROM  Complications: none  Hospital course:  Onset of Labor With Vaginal Delivery     31 y.o. yo [id number] at [redacted]w[redacted]d was admitted in Active Labor on 10/11/2016. Patient had an uncomplicated labor course as follows:  Membrane Rupture Time/Date: 6:07 AM ,10/11/2016   Intrapartum Procedures: Episiotomy: None [1]                                         Lacerations:  None [1]  Patient had a delivery of a Viable infant. 10/11/2016  Information for the patient's newborn:  [place], Ayars Lakeyn [id number]  Delivery Method: Vag-Spont    Pateint had an uncomplicated postpartum course.  She is ambulating, tolerating a regular diet, passing flatus, and urinating well. Patient is discharged home in stable condition on 10/13/16.   Physical exam  Vitals:   10/13/16 0533 10/13/16 0850 10/13/16 1015 10/13/16 1811  BP: (!) 103/58 115/66 122/80 119/70  Pulse: 60 70 84 74  Resp: 16 16 20 16   Temp: 97.8 F (36.6 C) 98.1 F (36.7 C) 98.2 F (36.8 C) 98.8 F (37.1 C)  TempSrc: Oral Oral Oral Oral  SpO2:   100%   Weight:      Height:       General: alert, cooperative and no distress Lochia: appropriate Uterine Fundus: firm Incision: N/A DVT Evaluation: No evidence of DVT seen on physical exam. No significant calf/ankle edema. Labs: Lab Results   Component Value Date   WBC 12.0 (H) 10/12/2016   HGB 10.1 (L) 10/12/2016   HCT 28.2 (L) 10/12/2016   MCV 83.4 10/12/2016   PLT 178 10/12/2016   CMP Latest Ref Rng & Units 11/12/2015  Glucose 65 - 99 mg/dL 89  BUN 6 - 20 mg/dL 13  Creatinine 0.44 - 1.00 mg/dL 0.45  Sodium 135 - 145 mmol/L 134(L)  Potassium 3.5 - 5.1 mmol/L 3.5  Chloride 101 - 111 mmol/L 103  CO2 22 - 32 mmol/L 21(L)  Calcium 8.9 - 10.3 mg/dL 9.3  Total Protein 6.5 - 8.1 g/dL 7.5  Total Bilirubin 0.3 - 1.2 mg/dL 1.3(H)  Alkaline Phos 38 - 126 U/L 36(L)  AST 15 - 41 U/L 17  ALT 14 - 54 U/L 14    Discharge instruction: per After Visit Summary and "Baby and Me Booklet".  After visit meds:  Allergies as of 10/13/2016   No Known Allergies     Medication List    STOP taking these medications   valACYclovir 500 MG tablet Commonly known as:  VALTREX     TAKE these medications   acetaminophen 500 MG tablet Commonly known as:  TYLENOL Take 1,000 mg by mouth every 6 (six) hours as needed for mild pain.   calcium carbonate 500 MG chewable tablet Commonly known as:  TUMS - dosed in mg elemental calcium Chew 2 tablets by mouth daily as needed for heartburn.   cyclobenzaprine 5 MG tablet Commonly known as:  FLEXERIL Take 1 tablet (5 mg total) by mouth 3 (three) times daily as needed for muscle spasms.   ibuprofen 600 MG tablet Commonly known as:  ADVIL,MOTRIN Take 1 tablet (600 mg total) by mouth every 6 (six) hours as needed.   prenatal multivitamin Tabs tablet Take 1 tablet by mouth daily at 12 noon.   ranitidine 75 MG tablet Commonly known as:  ZANTAC Take 75 mg by mouth once.       Diet: routine diet  Activity: Advance as tolerated. Pelvic rest for 6 weeks.   Outpatient follow up:6 weeks Follow up Appt:No future appointments. Follow up Visit:No Follow-up on file.  Postpartum contraception: Tubal Ligation - will schedule postpartum  Newborn Data: Live born female  Birth Weight: 6 lb 1.2  oz (2756 g) APGAR: 8, 9  Baby Feeding: Breast Disposition:home with mother   10/13/2016 Sherlyn Hay, DO

## 2016-11-08 NOTE — Patient Instructions (Signed)
Your procedure is scheduled on:  Monday, November 14, 2016  Enter through the Main Entrance of [facility] at:  6:00 AM  Pick up the phone at the desk and dial [phone].  Call this number if you have problems the morning of surgery: [phone].  Remember: Do NOT eat food or drink after:  Midnight Sunday  Take these medicines the morning of surgery with a SIP OF WATER:  Valtrex  Stop ALL herbal medications at this time  Do NOT smoke the day of surgery.  Do NOT wear jewelry (body piercing), metal hair clips/bobby pins, make-up, or nail polish. Do NOT wear lotions, powders, or perfumes.  You may wear deodorant. Do NOT shave for 48 hours prior to surgery. Do NOT bring valuables to the hospital. Contacts, dentures, or bridgework may not be worn into surgery.  Have a responsible adult drive you home and stay with you for 24 hours after your procedure  Bring a copy of your healthcare power of attorney and living will documents.

## 2016-11-09 ENCOUNTER — Encounter (HOSPITAL_COMMUNITY): Payer: Self-pay

## 2016-11-09 ENCOUNTER — Encounter (HOSPITAL_COMMUNITY)
Admission: RE | Admit: 2016-11-09 | Discharge: 2016-11-09 | Disposition: A | Discharge status: Home / Self Care | Payer: Medicaid Other | Source: Ambulatory Visit | Attending: Obstetrics and Gynecology | Admitting: Obstetrics and Gynecology

## 2016-11-09 DIAGNOSIS — A549 Gonococcal infection, unspecified: Secondary | ICD-10-CM | POA: Diagnosis not present

## 2016-11-09 DIAGNOSIS — D649 Anemia, unspecified: Secondary | ICD-10-CM | POA: Diagnosis not present

## 2016-11-09 DIAGNOSIS — A749 Chlamydial infection, unspecified: Secondary | ICD-10-CM | POA: Insufficient documentation

## 2016-11-09 DIAGNOSIS — Z9889 Other specified postprocedural states: Secondary | ICD-10-CM | POA: Diagnosis not present

## 2016-11-09 DIAGNOSIS — B009 Herpesviral infection, unspecified: Secondary | ICD-10-CM | POA: Insufficient documentation

## 2016-11-09 DIAGNOSIS — Z01812 Encounter for preprocedural laboratory examination: Secondary | ICD-10-CM | POA: Diagnosis present

## 2016-11-09 DIAGNOSIS — R51 Headache: Secondary | ICD-10-CM | POA: Insufficient documentation

## 2016-11-09 HISTORY — DX: Herpesviral infection, unspecified: B00.9

## 2016-11-09 HISTORY — DX: Headache, unspecified: R51.9

## 2016-11-09 HISTORY — DX: Headache: R51

## 2016-11-09 LAB — CBC
HCT: 38.4 % (ref 36.0–46.0)
HEMOGLOBIN: 13.2 g/dL (ref 12.0–15.0)
MCH: 29.8 pg (ref 26.0–34.0)
MCHC: 34.4 g/dL (ref 30.0–36.0)
MCV: 86.7 fL (ref 78.0–100.0)
PLATELETS: 264 10*3/uL (ref 150–400)
RBC: 4.43 MIL/uL (ref 3.87–5.11)
RDW: 14 % (ref 11.5–15.5)
WBC: 4.7 10*3/uL (ref 4.0–10.5)

## 2016-11-09 LAB — TYPE AND SCREEN
ABO/RH(D): A POS
ANTIBODY SCREEN: NEGATIVE

## 2016-11-12 NOTE — Anesthesia Preprocedure Evaluation (Addendum)
Anesthesia Evaluation  Patient identified by MRN, date of birth, ID band Patient awake    Reviewed: Allergy & Precautions, NPO status , Patient's Chart, lab work & pertinent test results  Airway Mallampati: II  TM Distance: >3 FB Neck ROM: Full    Dental no notable dental hx.    Pulmonary neg pulmonary ROS,    Pulmonary exam normal breath sounds clear to auscultation       Cardiovascular negative cardio ROS Normal cardiovascular exam Rhythm:Regular Rate:Normal     Neuro/Psych negative neurological ROS  negative psych ROS   GI/Hepatic negative GI ROS, Neg liver ROS,   Endo/Other  negative endocrine ROS  Renal/GU negative Renal ROS     Musculoskeletal negative musculoskeletal ROS (+)   Abdominal   Peds  Hematology negative hematology ROS (+)   Anesthesia Other Findings   Reproductive/Obstetrics negative OB ROS                           Anesthesia Physical Anesthesia Plan  ASA: I  Anesthesia Plan: General   Post-op Pain Management:    Induction: Intravenous  Airway Management Planned: Oral ETT  Additional Equipment:   Intra-op Plan:   Post-operative Plan: Extubation in OR  Informed Consent: I have reviewed the patients History and Physical, chart, labs and discussed the procedure including the risks, benefits and alternatives for the proposed anesthesia with the patient or authorized representative who has indicated his/her understanding and acceptance.   Dental advisory given  Plan Discussed with: CRNA  Anesthesia Plan Comments:        Anesthesia Quick Evaluation

## 2016-11-12 NOTE — H&P (Signed)
[name] is a 31 y.o. female G7P6 who presents for laparoscopic tubal sterilization.   Pt is s/p NSVD in 2/18 and wishes permanent sterilization procedure.  She is healthy.   Pertinent Gynecological History: Contraception: abstinence since delivery OB History: NSVD x 6   Menstrual History:  No LMP recorded.    Past Medical History:  Diagnosis Date  . Anemia   . Headache    history of migraines  . HSV (herpes simplex virus) infection   . Hx of chlamydia infection   . Hx of gonorrhea     Past Surgical History:  Procedure Laterality Date  . NO PAST SURGERIES      Family History  Problem Relation Age of Onset  . Hypertension Mother   . Diabetes Father     Social History:  reports that she has never smoked. She has never used smokeless tobacco. She reports that she does not drink alcohol or use drugs.  Allergies: No Known Allergies  No prescriptions prior to admission.    Review of Systems  Gastrointestinal: Negative for abdominal pain.    unknown if currently breastfeeding. Physical Exam  Constitutional: She is oriented to person, place, and time. She appears well-developed and well-nourished.  Cardiovascular: Normal rate and regular rhythm.   Respiratory: Effort normal.  Genitourinary: Vagina normal and uterus normal.  Neurological: She is alert and oriented to person, place, and time.  Psychiatric: She has a normal mood and affect.    No results found for this or any previous visit (from the past 24 hour(s)).  No results found.  Assessment/Plan: d/w pt her laparoscopic tubal fulguration in detail. d/w her risks and benefits including bleeding, infection, and possible damage to bowel and bladder. We discussed that should a complication arise she might need a larger incision and have a much longer recovery. We discussed the risk of failure of 1/100 with pregnancy occuring and an increased risk of ectopic pregnancy should she conceive after the surgery.  Pt desires to proceed.   [name] 11/12/2016, 7:46 PM

## 2016-11-14 ENCOUNTER — Ambulatory Visit (HOSPITAL_COMMUNITY): Payer: Medicaid Other | Admitting: Anesthesiology

## 2016-11-14 ENCOUNTER — Encounter (HOSPITAL_COMMUNITY)
Admission: RE | Disposition: A | Discharge status: Home / Self Care | Payer: Self-pay | Source: Ambulatory Visit | Attending: Obstetrics and Gynecology

## 2016-11-14 ENCOUNTER — Ambulatory Visit (HOSPITAL_COMMUNITY)
Admission: RE | Admit: 2016-11-14 | Discharge: 2016-11-14 | Disposition: A | Discharge status: Home / Self Care | Payer: Medicaid Other | Source: Ambulatory Visit | Attending: Obstetrics and Gynecology | Admitting: Obstetrics and Gynecology

## 2016-11-14 ENCOUNTER — Encounter (HOSPITAL_COMMUNITY): Payer: Self-pay

## 2016-11-14 DIAGNOSIS — Z302 Encounter for sterilization: Secondary | ICD-10-CM | POA: Insufficient documentation

## 2016-11-14 HISTORY — PX: LAPAROSCOPIC TUBAL LIGATION: SHX1937

## 2016-11-14 LAB — TYPE AND SCREEN
ABO/RH(D): A POS
Antibody Screen: NEGATIVE

## 2016-11-14 LAB — PREGNANCY, URINE: PREG TEST UR: NEGATIVE

## 2016-11-14 SURGERY — LIGATION, FALLOPIAN TUBE, LAPAROSCOPIC
Anesthesia: General | Site: Abdomen | Laterality: Bilateral

## 2016-11-14 MED ORDER — ROCURONIUM BROMIDE 100 MG/10ML IV SOLN
INTRAVENOUS | Status: AC
  Filled 2016-11-14: qty 1

## 2016-11-14 MED ORDER — OXYCODONE HCL 5 MG PO TABS: 5.0000 mg | ORAL_TABLET | Freq: Once | ORAL | Status: DC | PRN

## 2016-11-14 MED ORDER — FENTANYL CITRATE (PF) 250 MCG/5ML IJ SOLN
INTRAMUSCULAR | Status: AC
  Filled 2016-11-14: qty 5

## 2016-11-14 MED ORDER — LACTATED RINGERS IV SOLN
INTRAVENOUS | Status: DC
  Administered 2016-11-14: 07:00:00 via INTRAVENOUS

## 2016-11-14 MED ORDER — ONDANSETRON HCL 4 MG/2ML IJ SOLN
INTRAMUSCULAR | Status: DC | PRN
  Administered 2016-11-14: 4 mg via INTRAVENOUS

## 2016-11-14 MED ORDER — KETOROLAC TROMETHAMINE 30 MG/ML IJ SOLN
INTRAMUSCULAR | Status: DC | PRN
  Administered 2016-11-14: 30 mg via INTRAVENOUS

## 2016-11-14 MED ORDER — METOCLOPRAMIDE HCL 5 MG/ML IJ SOLN
INTRAMUSCULAR | Status: AC
  Filled 2016-11-14: qty 2

## 2016-11-14 MED ORDER — FENTANYL CITRATE (PF) 100 MCG/2ML IJ SOLN
INTRAMUSCULAR | Status: DC | PRN
Start: 2016-11-14 — End: 2016-11-14
  Administered 2016-11-14: 50 ug via INTRAVENOUS
  Administered 2016-11-14 (×2): 100 ug via INTRAVENOUS

## 2016-11-14 MED ORDER — ROCURONIUM BROMIDE 100 MG/10ML IV SOLN
INTRAVENOUS | Status: DC | PRN
  Administered 2016-11-14: 40 mg via INTRAVENOUS

## 2016-11-14 MED ORDER — MEPERIDINE HCL 25 MG/ML IJ SOLN: 6.2500 mg | INTRAMUSCULAR | Status: DC | PRN

## 2016-11-14 MED ORDER — DEXAMETHASONE SODIUM PHOSPHATE 10 MG/ML IJ SOLN
INTRAMUSCULAR | Status: DC | PRN
  Administered 2016-11-14: 4 mg via INTRAVENOUS

## 2016-11-14 MED ORDER — SCOPOLAMINE 1 MG/3DAYS TD PT72
1.0000 | MEDICATED_PATCH | Freq: Once | TRANSDERMAL | Status: DC
  Administered 2016-11-14: 1.5 mg via TRANSDERMAL

## 2016-11-14 MED ORDER — SODIUM CHLORIDE 0.9% FLUSH
INTRAVENOUS | Status: AC
  Filled 2016-11-14: qty 3

## 2016-11-14 MED ORDER — PROPOFOL 10 MG/ML IV BOLUS
INTRAVENOUS | Status: AC
  Filled 2016-11-14: qty 20

## 2016-11-14 MED ORDER — MIDAZOLAM HCL 2 MG/2ML IJ SOLN
INTRAMUSCULAR | Status: DC | PRN
  Administered 2016-11-14: 2 mg via INTRAVENOUS

## 2016-11-14 MED ORDER — SCOPOLAMINE 1 MG/3DAYS TD PT72
MEDICATED_PATCH | TRANSDERMAL | Status: AC
  Administered 2016-11-14: 1.5 mg via TRANSDERMAL
  Filled 2016-11-14: qty 1

## 2016-11-14 MED ORDER — DEXAMETHASONE SODIUM PHOSPHATE 4 MG/ML IJ SOLN
INTRAMUSCULAR | Status: AC
  Filled 2016-11-14: qty 1

## 2016-11-14 MED ORDER — PROPOFOL 10 MG/ML IV BOLUS
INTRAVENOUS | Status: DC | PRN
  Administered 2016-11-14: 200 mg via INTRAVENOUS

## 2016-11-14 MED ORDER — SUGAMMADEX SODIUM 200 MG/2ML IV SOLN
INTRAVENOUS | Status: AC
  Filled 2016-11-14: qty 2

## 2016-11-14 MED ORDER — BUPIVACAINE HCL (PF) 0.25 % IJ SOLN
INTRAMUSCULAR | Status: AC
  Filled 2016-11-14: qty 30

## 2016-11-14 MED ORDER — HYDROMORPHONE HCL 1 MG/ML IJ SOLN
0.2500 mg | INTRAMUSCULAR | Status: DC | PRN
  Administered 2016-11-14 (×2): 0.5 mg via INTRAVENOUS

## 2016-11-14 MED ORDER — IBUPROFEN 800 MG PO TABS: 800.0000 mg | ORAL_TABLET | Freq: Three times a day (TID) | ORAL | 0 refills | Status: DC | PRN

## 2016-11-14 MED ORDER — PROMETHAZINE HCL 25 MG/ML IJ SOLN: 6.2500 mg | INTRAMUSCULAR | Status: DC | PRN

## 2016-11-14 MED ORDER — LIDOCAINE HCL (CARDIAC) 20 MG/ML IV SOLN
INTRAVENOUS | Status: DC | PRN
  Administered 2016-11-14: 80 mg via INTRAVENOUS

## 2016-11-14 MED ORDER — BUPIVACAINE HCL (PF) 0.25 % IJ SOLN
INTRAMUSCULAR | Status: DC | PRN
  Administered 2016-11-14: 9 mL

## 2016-11-14 MED ORDER — OXYCODONE HCL 5 MG/5ML PO SOLN: 5.0000 mg | Freq: Once | ORAL | Status: DC | PRN

## 2016-11-14 MED ORDER — KETOROLAC TROMETHAMINE 30 MG/ML IJ SOLN: 30.0000 mg | Freq: Once | INTRAMUSCULAR | Status: DC | PRN

## 2016-11-14 MED ORDER — 0.9 % SODIUM CHLORIDE (POUR BTL) OPTIME
TOPICAL | Status: DC | PRN
  Administered 2016-11-14: 1000 mL

## 2016-11-14 MED ORDER — LIDOCAINE HCL (CARDIAC) 20 MG/ML IV SOLN
INTRAVENOUS | Status: AC
  Filled 2016-11-14: qty 5

## 2016-11-14 MED ORDER — HYDROCODONE-ACETAMINOPHEN 5-325 MG PO TABS: 1.0000 | ORAL_TABLET | Freq: Four times a day (QID) | ORAL | 0 refills | Status: DC | PRN

## 2016-11-14 MED ORDER — METOCLOPRAMIDE HCL 5 MG/ML IJ SOLN
5.0000 mg | Freq: Once | INTRAMUSCULAR | Status: AC
  Administered 2016-11-14: 5 mg via INTRAVENOUS

## 2016-11-14 MED ORDER — HYDROMORPHONE HCL 1 MG/ML IJ SOLN
INTRAMUSCULAR | Status: AC
  Filled 2016-11-14: qty 1

## 2016-11-14 MED ORDER — MIDAZOLAM HCL 2 MG/2ML IJ SOLN
INTRAMUSCULAR | Status: AC
  Filled 2016-11-14: qty 2

## 2016-11-14 MED ORDER — KETOROLAC TROMETHAMINE 30 MG/ML IJ SOLN
INTRAMUSCULAR | Status: AC
  Filled 2016-11-14: qty 1

## 2016-11-14 MED ORDER — ONDANSETRON HCL 4 MG/2ML IJ SOLN
INTRAMUSCULAR | Status: AC
  Filled 2016-11-14: qty 2

## 2016-11-14 MED ORDER — SUGAMMADEX SODIUM 200 MG/2ML IV SOLN
INTRAVENOUS | Status: DC | PRN
  Administered 2016-11-14: 200 mg via INTRAVENOUS

## 2016-11-14 SURGICAL SUPPLY — 26 items
ADH SKN CLS APL DERMABOND .7 (GAUZE/BANDAGES/DRESSINGS) ×1
CATH ROBINSON RED A/P 16FR (CATHETERS) ×3 IMPLANT
CLOTH BEACON ORANGE TIMEOUT ST (SAFETY) ×3 IMPLANT
DERMABOND ADVANCED (GAUZE/BANDAGES/DRESSINGS) ×2
DERMABOND ADVANCED .7 DNX12 (GAUZE/BANDAGES/DRESSINGS) ×1 IMPLANT
DRSG OPSITE POSTOP 3X4 (GAUZE/BANDAGES/DRESSINGS) IMPLANT
DURAPREP 26ML APPLICATOR (WOUND CARE) ×3 IMPLANT
GLOVE BIO SURGEON STRL SZ 6.5 (GLOVE) ×2 IMPLANT
GLOVE BIO SURGEONS STRL SZ 6.5 (GLOVE) ×1
GLOVE BIOGEL PI IND STRL 7.0 (GLOVE) ×1 IMPLANT
GLOVE BIOGEL PI INDICATOR 7.0 (GLOVE) ×2
GOWN STRL REUS W/TWL LRG LVL3 (GOWN DISPOSABLE) ×6 IMPLANT
NEEDLE INSUFFLATION 120MM (ENDOMECHANICALS) ×3 IMPLANT
PACK LAPAROSCOPY BASIN (CUSTOM PROCEDURE TRAY) ×3 IMPLANT
PACK TRENDGUARD 450 HYBRID PRO (MISCELLANEOUS) IMPLANT
PACK TRENDGUARD 600 HYBRD PROC (MISCELLANEOUS) IMPLANT
PROTECTOR NERVE ULNAR (MISCELLANEOUS) ×6 IMPLANT
SUT VIC AB 3-0 PS2 18 (SUTURE) ×3
SUT VIC AB 3-0 PS2 18XBRD (SUTURE) IMPLANT
SUT VICRYL 0 UR6 27IN ABS (SUTURE) ×3 IMPLANT
TOWEL OR 17X24 6PK STRL BLUE (TOWEL DISPOSABLE) ×6 IMPLANT
TRENDGUARD 450 HYBRID PRO PACK (MISCELLANEOUS)
TRENDGUARD 600 HYBRID PROC PK (MISCELLANEOUS)
TROCAR XCEL NON-BLD 11X100MML (ENDOMECHANICALS) ×3 IMPLANT
WARMER LAPAROSCOPE (MISCELLANEOUS) ×3 IMPLANT
WATER STERILE IRR 1000ML POUR (IV SOLUTION) ×3 IMPLANT

## 2016-11-14 NOTE — Anesthesia Procedure Notes (Signed)
Procedure Name: Intubation Date/Time: 11/14/2016 7:18 AM Performed by: Jonna Munro Pre-anesthesia Checklist: Patient identified, Emergency Drugs available, Suction available, Patient being monitored and Timeout performed Patient Re-evaluated:Patient Re-evaluated prior to inductionOxygen Delivery Method: Circle system utilized Preoxygenation: Pre-oxygenation with 100% oxygen Intubation Type: IV induction Ventilation: Mask ventilation without difficulty Laryngoscope Size: Mac and 3 Grade View: Grade I Tube type: Oral Tube size: 7.0 mm Number of attempts: 1 Airway Equipment and Method: Stylet Secured at: 22 cm Tube secured with: Tape Dental Injury: Teeth and Oropharynx as per pre-operative assessment

## 2016-11-14 NOTE — Discharge Instructions (Signed)
Dilation and Curettage or Vacuum Curettage, Care After These instructions give you information about caring for yourself after your procedure. Your doctor may also give you more specific instructions. Call your doctor if you have any problems or questions after your procedure. Follow these instructions at home: Activity   Do not drive or use heavy machinery while taking prescription pain medicine.  For 24 hours after your procedure, avoid driving.  Take short walks often, followed by rest periods. Ask your doctor what activities are safe for you. After one or two days, you may be able to return to your normal activities.  Do not lift anything that is heavier than 10 lb (4.5 kg) until your doctor approves.  For at least 2 weeks, or as long as told by your doctor:  Do not douche.  Do not use tampons.  Do not have sex. General instructions   Take over-the-counter and prescription medicines only as told by your doctor. This is very important if you take blood thinning medicine.  Do not take baths, swim, or use a hot tub until your doctor approves. Take showers instead of baths.  Wear compression stockings as told by your doctor.  It is up to you to get the results of your procedure. Ask your doctor when your results will be ready.  Keep all follow-up visits as told by your doctor. This is important. Contact a doctor if:  You have very bad cramps that get worse or do not get better with medicine.  You have very bad pain in your belly (abdomen).  You cannot drink fluids without throwing up (vomiting).  You get pain in a different part of the area between your belly and thighs (pelvis).  You have bad-smelling discharge from your vagina.  You have a rash. Get help right away if:  You are bleeding a lot from your vagina. A lot of bleeding means soaking more than one sanitary pad in an hour, for 2 hours in a row.  You have clumps of blood (blood clots) coming from your  vagina.  You have a fever or chills.  Your belly feels very tender or hard.  You have chest pain.  You have trouble breathing.  You cough up blood.  You feel dizzy.  You feel light-headed.  You pass out (faint).  You have pain in your neck or shoulder area. Summary  Take short walks often, followed by rest periods. Ask your doctor what activities are safe for you. After one or two days, you may be able to return to your normal activities.  Do not lift anything that is heavier than 10 lb (4.5 kg) until your doctor approves.  Do not take baths, swim, or use a hot tub until your doctor approves. Take showers instead of baths.  Contact your doctor if you have any symptoms of infection, like bad-smelling discharge from your vagina. This information is not intended to replace advice given to you by your health care provider. Make sure you discuss any questions you have with your health care provider. Document Released: 05/17/2008 Document Revised: 04/25/2016 Document Reviewed: 04/25/2016 Elsevier Interactive Patient Education  2017 [employer]. Hysteroscopy, Care After Refer to this sheet in the next few weeks. These instructions provide you with information on caring for yourself after your procedure. Your health care provider may also give you more specific instructions. Your treatment has been planned according to current medical practices, but problems sometimes occur. Call your health care provider if you have any  problems or questions after your procedure. What can I expect after the procedure? After your procedure, it is typical to have the following:  You may have some cramping. This normally lasts for a couple days.  You may have bleeding. This can vary from light spotting for a few days to menstrual-like bleeding for 3-7 days. Follow these instructions at home:  Rest for the first 1-2 days after the procedure.  Only take over-the-counter or prescription medicines as  directed by your health care provider. Do not take aspirin. It can increase the chances of bleeding.  Take showers instead of baths for 2 weeks or as directed by your health care provider.  Do not drive for 24 hours or as directed.  Do not drink alcohol while taking pain medicine.  Do not use tampons, douche, or have sexual intercourse for 2 weeks or until your health care provider says it is okay.  Take your temperature twice a day for 4-5 days. Write it down each time.  Follow your health care provider's advice about diet, exercise, and lifting.  If you develop constipation, you may:  Take a mild laxative if your health care provider approves.  Add bran foods to your diet.  Drink enough fluids to keep your urine clear or pale yellow.  Try to have someone with you or available to you for the first 24-48 hours, especially if you were given a general anesthetic.  Follow up with your health care provider as directed. Contact a health care provider if:  You feel dizzy or lightheaded.  You feel sick to your stomach (nauseous).  You have abnormal vaginal discharge.  You have a rash.  You have pain that is not controlled with medicine. Get help right away if:  You have bleeding that is heavier than a normal menstrual period.  You have a fever.  You have increasing cramps or pain, not controlled with medicine.  You have new belly (abdominal) pain.  You pass out.  You have pain in the tops of your shoulders (shoulder strap areas).  You have shortness of breath. This information is not intended to replace advice given to you by your health care provider. Make sure you discuss any questions you have with your health care provider. Document Released: 05/29/2013 Document Revised: 01/14/2016 Document Reviewed: 03/07/2013 Elsevier Interactive Patient Education  2017 [employer] Laparoscopic Tubal Ligation, Care After Refer to this sheet in the next few weeks. These  instructions provide you with information about caring for yourself after your procedure. Your health care provider may also give you more specific instructions. Your treatment has been planned according to current medical practices, but problems sometimes occur. Call your health care provider if you have any problems or questions after your procedure. What can I expect after the procedure? After the procedure, it is common to have:  A sore throat.  Discomfort in your shoulder.  Mild discomfort or cramping in your abdomen.  Gas pains.  Pain or soreness in the area where the surgical cut (incision) was made.  A bloated feeling.  Tiredness.  Nausea.  Vomiting. Follow these instructions at home: Medicines   Take over-the-counter and prescription medicines only as told by your health care provider.  Do not take aspirin because it can cause bleeding.  Do not drive or operate heavy machinery while taking prescription pain medicine. Activity   Rest for the rest of the day.  Return to your normal activities as told by your health care provider. Ask  your health care provider what activities are safe for you. Incision care    Follow instructions from your health care provider about how to take care of your incision. Make sure you:  Wash your hands with soap and water before you change your bandage (dressing). If soap and water are not available, use hand sanitizer.  Change your dressing as told by your health care provider.  Leave stitches (sutures) in place. They may need to stay in place for 2 weeks or longer.  Check your incision area every day for signs of infection. Check for:  More redness, swelling, or pain.  More fluid or blood.  Warmth.  Pus or a bad smell. Other Instructions   Do not take baths, swim, or use a hot tub until your health care provider approves. You may take showers.  Keep all follow-up visits as told by your health care provider. This is  important.  Have someone help you with your daily household tasks for the first few days. Contact a health care provider if:  You have more redness, swelling, or pain around your incision.  Your incision feels warm to the touch.  You have pus or a bad smell coming from your incision.  The edges of your incision break open after the sutures have been removed.  Your pain does not improve after 2-3 days.  You have a rash.  You repeatedly become dizzy or light-headed.  Your pain medicine is not helping.  You are constipated. Get help right away if:  You have a fever.  You faint.  You have increasing pain in your abdomen.  You have severe pain in one or both of your shoulders.  You have fluid or blood coming from your sutures or from your vagina.  You have shortness of breath or difficulty breathing.  You have chest pain or leg pain.  You have ongoing nausea, vomiting, or diarrhea.   General Anesthesia, Adult, Care After These instructions provide you with information about caring for yourself after your procedure. Your health care provider may also give you more specific instructions. Your treatment has been planned according to current medical practices, but problems sometimes occur. Call your health care provider if you have any problems or questions after your procedure. What can I expect after the procedure? After the procedure, it is common to have:  Vomiting.  A sore throat.  Mental slowness. It is common to feel:  Nauseous.  Cold or shivery.  Sleepy.  Tired.  Sore or achy, even in parts of your body where you did not have surgery. Follow these instructions at home: For at least 24 hours after the procedure:   Do not:  Participate in activities where you could fall or become injured.  Drive.  Use heavy machinery.  Drink alcohol.  Take sleeping pills or medicines that cause drowsiness.  Make important decisions or sign legal  documents.  Take care of children on your own.  Rest. Eating and drinking   If you vomit, drink water, juice, or soup when you can drink without vomiting.  Drink enough fluid to keep your urine clear or pale yellow.  Make sure you have little or no nausea before eating solid foods.  Follow the diet recommended by your health care provider. General instructions   Have a responsible adult stay with you until you are awake and alert.  Return to your normal activities as told by your health care provider. Ask your health care provider what activities are  safe for you.  Take over-the-counter and prescription medicines only as told by your health care provider.  If you smoke, do not smoke without supervision.  Keep all follow-up visits as told by your health care provider. This is important.

## 2016-11-14 NOTE — Transfer of Care (Signed)
Immediate Anesthesia Transfer of Care Note  Patient: [name]  Procedure(s) Performed: Procedure(s): LAPAROSCOPIC TUBAL LIGATION (Bilateral)  Patient Location: PACU  Anesthesia Type:General  Level of Consciousness: awake, alert  and oriented  Airway & Oxygen Therapy: Patient Spontanous Breathing and Patient connected to nasal cannula oxygen  Post-op Assessment: Report given to RN, Post -op Vital signs reviewed and stable and Patient moving all extremities X 4  Post vital signs: Reviewed and stable  Last Vitals:  Vitals:   11/14/16 0618  BP: (!) 138/92  Pulse: 79  Resp: 16  Temp: 36.8 C    Last Pain:  Vitals:   11/14/16 0618  TempSrc: Oral      Patients Stated Pain Goal: 5 (11/46/43 1427)  Complications: No apparent anesthesia complications

## 2016-11-14 NOTE — Op Note (Signed)
Operative Note    Preoperative Diagnosis Desires sterilization  Postoperative Diagnosis same  Procedure Laparoscopic Bilateral Tubal Fulguration  Surgeon Paula Compton, MD  Anesthesia GETA  Fluids: EBL76ml UOP 21ml straight cath IVF 1200LR  Findings Normal uterus tubes and ovaries.  Normal abdominal anatomy  Specimen none  Procedure Note  Patient was taken to the operating room where general anesthesia was obtained without difficulty. She was then prepped and draped in the normal sterile fashion in the dorsal lithotomy position. An appropriate timeout was performed. A speculum was then placed within the vagina and a Hulka tenaculum placed within the cervix for uterine manipulation. The bladder was emptied. Attention was then turned to the patient's abdomen after draping where the infraumbilical area was injected with approximately 10 cc of quarter percent Marcaine. A 1 cm incision was then made within the umbilicus and the varies needle easily introduced into the peritoneal cavity. Intraperitoneal placement was confirmed by aspiration and injection with normal saline. Gas flow was then applied and a pneumoperitoneum obtained with approximate 3 L of CO2 gas. The varies needle was then removed and the 11 mm trocar was easily introduced into the abdomen. With patient in Trendelenburg the uterus and tubes and ovaries were inspected with findings as previously stated. A bipolar cautery was then introduced through the operative scope and the left fallopian tube grasped approximately 3 cm from the uterine cornua and cauterized in 2 sequential areas with good blanching noted. Attention was then turned to the right fallopian tube which was likewise grasped proximally 3 cm from the uterine cornua and cauterized into sequential spots with good blanching noted there as well. The remainder of the pelvis and abdomen were inspected and found to be normal. The instruments were removed from the  abdomen and the pneumoperitoneum reduced through the trocar. The trocar was finally removed and the infraumbilical incision closed with a fascial stitch of 0 Vicryl and a subcuticular stitch of 3-0 Vicryl. Dermabond and a bandage were placed. Patient was then awakened and taken to the recovery room in good condition.

## 2016-11-14 NOTE — Progress Notes (Signed)
CRNA Shanon Brow aware of BP

## 2016-11-14 NOTE — Anesthesia Postprocedure Evaluation (Signed)
Anesthesia Post Note  Patient: [name]  Procedure(s) Performed: Procedure(s) (LRB): LAPAROSCOPIC TUBAL LIGATION (Bilateral)  Patient location during evaluation: PACU Anesthesia Type: General Level of consciousness: sedated and patient cooperative Pain management: pain level controlled Vital Signs Assessment: post-procedure vital signs reviewed and stable Respiratory status: spontaneous breathing Cardiovascular status: stable Anesthetic complications: no        Last Vitals:  Vitals:   11/14/16 0930 11/14/16 1000  BP: (!) 136/95 (!) 138/98  Pulse: 78 79  Resp: 13 14  Temp: 36.8 C     Last Pain:  Vitals:   11/14/16 1000  TempSrc:   PainSc: 3    Pain Goal: Patients Stated Pain Goal: 5 (11/14/16 0618)               Nolon Nations

## 2016-11-14 NOTE — Progress Notes (Signed)
Patient ID: [name], female   DOB: 02/20/1986, 31 y.o.   MRN: [id number] Per pt no changes in dictated H&P.  Brief exam WNL.  Ready to proceed.

## 2016-11-15 ENCOUNTER — Encounter (HOSPITAL_COMMUNITY): Payer: Self-pay | Admitting: Obstetrics and Gynecology

## 2016-12-02 ENCOUNTER — Emergency Department (HOSPITAL_COMMUNITY)
Admission: EM | Admit: 2016-12-02 | Discharge: 2016-12-02 | Disposition: A | Discharge status: Home / Self Care | Payer: Medicaid Other | Attending: Emergency Medicine | Admitting: Emergency Medicine

## 2016-12-02 ENCOUNTER — Encounter (HOSPITAL_COMMUNITY): Payer: Self-pay | Admitting: Emergency Medicine

## 2016-12-02 DIAGNOSIS — R197 Diarrhea, unspecified: Secondary | ICD-10-CM | POA: Insufficient documentation

## 2016-12-02 DIAGNOSIS — R110 Nausea: Secondary | ICD-10-CM | POA: Diagnosis not present

## 2016-12-02 DIAGNOSIS — Z79899 Other long term (current) drug therapy: Secondary | ICD-10-CM | POA: Diagnosis not present

## 2016-12-02 LAB — URINALYSIS, ROUTINE W REFLEX MICROSCOPIC
BACTERIA UA: NONE SEEN
Bilirubin Urine: NEGATIVE
GLUCOSE, UA: NEGATIVE mg/dL
HGB URINE DIPSTICK: NEGATIVE
Ketones, ur: 5 mg/dL — AB
Leukocytes, UA: NEGATIVE
NITRITE: NEGATIVE
Protein, ur: 30 mg/dL — AB
SPECIFIC GRAVITY, URINE: 1.034 — AB (ref 1.005–1.030)
pH: 5 (ref 5.0–8.0)

## 2016-12-02 LAB — COMPREHENSIVE METABOLIC PANEL
ALT: 24 U/L (ref 14–54)
ANION GAP: 7 (ref 5–15)
AST: 24 U/L (ref 15–41)
Albumin: 4.1 g/dL (ref 3.5–5.0)
Alkaline Phosphatase: 46 U/L (ref 38–126)
BUN: 12 mg/dL (ref 6–20)
CHLORIDE: 102 mmol/L (ref 101–111)
CO2: 24 mmol/L (ref 22–32)
CREATININE: 0.51 mg/dL (ref 0.44–1.00)
Calcium: 8.5 mg/dL — ABNORMAL LOW (ref 8.9–10.3)
GFR calc non Af Amer: >60 mL/min (ref >60)
Glucose, Bld: 88 mg/dL (ref 65–99)
POTASSIUM: 3.3 mmol/L — AB (ref 3.5–5.1)
SODIUM: 133 mmol/L — AB (ref 135–145)
Total Bilirubin: 0.8 mg/dL (ref 0.3–1.2)
Total Protein: 7.2 g/dL (ref 6.5–8.1)

## 2016-12-02 LAB — CBC
HEMATOCRIT: 37.2 % (ref 36.0–46.0)
HEMOGLOBIN: 13 g/dL (ref 12.0–15.0)
MCH: 29.6 pg (ref 26.0–34.0)
MCHC: 34.9 g/dL (ref 30.0–36.0)
MCV: 84.7 fL (ref 78.0–100.0)
Platelets: 243 10*3/uL (ref 150–400)
RBC: 4.39 MIL/uL (ref 3.87–5.11)
RDW: 13.1 % (ref 11.5–15.5)
WBC: 4.7 10*3/uL (ref 4.0–10.5)

## 2016-12-02 LAB — I-STAT BETA HCG BLOOD, ED (MC, WL, AP ONLY)

## 2016-12-02 LAB — LIPASE, BLOOD: LIPASE: 23 U/L (ref 11–51)

## 2016-12-02 MED ORDER — SODIUM CHLORIDE 0.9 % IV BOLUS (SEPSIS)
1000.0000 mL | Freq: Once | INTRAVENOUS | Status: AC
  Administered 2016-12-02: 1000 mL via INTRAVENOUS

## 2016-12-02 MED ORDER — ONDANSETRON HCL 4 MG/2ML IJ SOLN
4.0000 mg | Freq: Once | INTRAMUSCULAR | Status: AC
  Administered 2016-12-02: 4 mg via INTRAVENOUS
  Filled 2016-12-02: qty 2

## 2016-12-02 MED ORDER — LOPERAMIDE HCL 2 MG PO CAPS: 2.0000 mg | ORAL_CAPSULE | Freq: Four times a day (QID) | ORAL | 0 refills | Status: DC | PRN

## 2016-12-02 MED ORDER — KETOROLAC TROMETHAMINE 30 MG/ML IJ SOLN
30.0000 mg | Freq: Once | INTRAMUSCULAR | Status: AC
  Administered 2016-12-02: 30 mg via INTRAVENOUS
  Filled 2016-12-02: qty 1

## 2016-12-02 MED ORDER — LOPERAMIDE HCL 2 MG PO CAPS
2.0000 mg | ORAL_CAPSULE | Freq: Once | ORAL | Status: AC
  Administered 2016-12-02: 2 mg via ORAL
  Filled 2016-12-02: qty 1

## 2016-12-02 MED ORDER — ONDANSETRON 4 MG PO TBDP: 4.0000 mg | ORAL_TABLET | Freq: Three times a day (TID) | ORAL | 0 refills | Status: DC | PRN

## 2016-12-02 NOTE — ED Provider Notes (Signed)
[facility] DEPT Provider Note   CSN: [id number] Arrival date & time: 12/02/16  6195     History   Chief Complaint Chief Complaint  Patient presents with  . Abdominal Pain    HPI [name] is a 31 y.o. female.  The history is provided by the patient and medical records.  Abdominal Pain   Associated symptoms include diarrhea and nausea.     31 y.o. F with hx of migraines headaches, anemia, HSV, presenting to the ED for abdominal pain, nausea, and diarrhea x 3 days.  States started out of the blue.  She reports she has abdominal cramping and aching pains.  Diarrhea has been watery but non-bloody.  Reports nausea but no vomiting.  No fever, chills.  No sick contacts.  No recent travel out of the country or antibiotics use.  States she is starting to feel dehydrated and weak.  She has not tried anything for her symptoms.  States appetite has been poor because she feels her food runs through her.  She has not had any issues with incontinence.  No blood in the stool.  No syncopal events.  Past Medical History:  Diagnosis Date  . Anemia   . Headache    history of migraines  . HSV (herpes simplex virus) infection   . Hx of chlamydia infection   . Hx of gonorrhea     Patient Active Problem List   Diagnosis Date Noted  . Normal labor 10/11/2016  . Normal pregnancy in third trimester 10/11/2016  . NSVD (normal spontaneous vaginal delivery) 10/11/2016  . SVD (spontaneous vaginal delivery) 12/04/2014  . Indication for care in labor or delivery 11/14/2013  . NVD (normal vaginal delivery) 11/14/2013    Past Surgical History:  Procedure Laterality Date  . LAPAROSCOPIC TUBAL LIGATION Bilateral 11/14/2016   Procedure: LAPAROSCOPIC TUBAL LIGATION;  Surgeon: Paula Compton, MD;  Location: [facility];  Service: Gynecology;  Laterality: Bilateral;  . NO PAST SURGERIES      OB History    Gravida Para Term Preterm AB Living   7 6 4 2 1 6    SAB TAB Ectopic Multiple Live Births     1     0 6       Home Medications    Prior to Admission medications   Medication Sig Start Date End Date Taking? Authorizing Provider  HYDROcodone-acetaminophen (NORCO/VICODIN) 5-325 MG tablet Take 1-2 tablets by mouth every 6 (six) hours as needed for moderate pain. 11/14/16   Paula Compton, MD  ibuprofen (ADVIL,MOTRIN) 800 MG tablet Take 1 tablet (800 mg total) by mouth every 8 (eight) hours as needed. 11/14/16   Paula Compton, MD  Prenat-FeCbn-FeAsp-Meth-FA-DHA (PRENATE MINI) 18-0.6-0.4-350 MG CAPS Take 1 tablet by mouth daily. 09/28/16   Historical Provider, MD  valACYclovir (VALTREX) 500 MG tablet Take 500 mg by mouth daily. 09/28/16   Historical Provider, MD    Family History Family History  Problem Relation Age of Onset  . Hypertension Mother   . Diabetes Father     Social History Social History  Substance Use Topics  . Smoking status: Never Smoker  . Smokeless tobacco: Never Used  . Alcohol use No     Allergies   Patient has no known allergies.   Review of Systems Review of Systems  Gastrointestinal: Positive for abdominal pain, diarrhea and nausea.  All other systems reviewed and are negative.    Physical Exam Updated Vital Signs BP 135/90 (BP Location: Left Arm)   Pulse  97   Temp 97.5 F (36.4 C) (Oral)   Resp 18   Wt 75.3 kg   LMP 11/09/2016 (Exact Date)   SpO2 100%   BMI 29.41 kg/m   Physical Exam  Constitutional: She is oriented to person, place, and time. She appears well-developed and well-nourished.  HENT:  Head: Normocephalic and atraumatic.  Mouth/Throat: Oropharynx is clear and moist.  Eyes: Conjunctivae and EOM are normal. Pupils are equal, round, and reactive to light.  Neck: Normal range of motion.  Cardiovascular: Normal rate, regular rhythm and normal heart sounds.   Pulmonary/Chest: Effort normal and breath sounds normal.  Abdominal: Soft. Bowel sounds are normal. There is no tenderness. There is no rebound.  Abdomen soft,  benign  Musculoskeletal: Normal range of motion.  Neurological: She is alert and oriented to person, place, and time.  Skin: Skin is warm and dry.  Psychiatric: She has a normal mood and affect.  Nursing note and vitals reviewed.    ED Treatments / Results  Labs (all labs ordered are listed, but only abnormal results are displayed) Labs Reviewed  COMPREHENSIVE METABOLIC PANEL - Abnormal; Notable for the following:       Result Value   Sodium 133 (*)    Potassium 3.3 (*)    Calcium 8.5 (*)    All other components within normal limits  URINALYSIS, ROUTINE W REFLEX MICROSCOPIC - Abnormal; Notable for the following:    APPearance HAZY (*)    Specific Gravity, Urine 1.034 (*)    Ketones, ur 5 (*)    Protein, ur 30 (*)    Squamous Epithelial / LPF 6-30 (*)    All other components within normal limits  LIPASE, BLOOD  CBC  I-STAT BETA HCG BLOOD, ED ([facility], [facility], [facility] ONLY)    EKG  EKG Interpretation None       Radiology No results found.  Procedures Procedures (including critical care time)  Medications Ordered in ED Medications  sodium chloride 0.9 % bolus 1,000 mL (0 mLs Intravenous Stopped 12/02/16 1004)  ondansetron (ZOFRAN) injection 4 mg (4 mg Intravenous Given 12/02/16 0835)  ketorolac (TORADOL) 30 MG/ML injection 30 mg (30 mg Intravenous Given 12/02/16 0834)  loperamide (IMODIUM) capsule 2 mg (2 mg Oral Given 12/02/16 0835)     Initial Impression / Assessment and Plan / ED Course  I have reviewed the triage vital signs and the nursing notes.  Pertinent labs & imaging results that were available during my care of the patient were reviewed by me and considered in my medical decision making (see chart for details).  31 year old female here with diarrhea, nausea, abdominal cramping for the past 3 days. She is afebrile and nontoxic. Her abdomen is soft and benign. Screening lab work obtained and is overall reassuring. She was treated here with IV fluids, Zofran, Toradol, and  Imodium with improvement of her symptoms. She has not had any further diarrhea episodes here. Tolerating by mouth without issue. Suspect viral gastroenteritis. We'll plan to discharge home with continued supportive care. Given work note.  Follow-up with PCP.  Discussed plan with patient, she acknowledged understanding and agreed with plan of care.  Return precautions given for new or worsening symptoms.  Final Clinical Impressions(s) / ED Diagnoses   Final diagnoses:  Diarrhea, unspecified type  Nausea    New Prescriptions Discharge Medication List as of 12/02/2016 10:31 AM    START taking these medications   Details  loperamide (IMODIUM) 2 MG capsule Take 1 capsule (2 mg  total) by mouth 4 (four) times daily as needed for diarrhea or loose stools., Starting Fri 12/02/2016, Print    ondansetron (ZOFRAN ODT) 4 MG disintegrating tablet Take 1 tablet (4 mg total) by mouth every 8 (eight) hours as needed for nausea., Starting Fri 12/02/2016, Print         Larene Pickett, PA-C 12/02/16 [place], MD 12/02/16 1547

## 2016-12-02 NOTE — ED Triage Notes (Signed)
Pt complaint of generalized abdominal pain with associated diarrhea and nausea for 3 days.

## 2016-12-02 NOTE — Discharge Instructions (Signed)
Take the prescribed medication as directed.  Recommend gentle diet, avoid dairy and heavy meats for now as this can make diarrhea worse.  Start bland and progress back to normal as tolerated. Follow-up with your primary care doctor for any ongoing issues. Return to the ED for new or worsening symptoms.

## 2016-12-29 ENCOUNTER — Inpatient Hospital Stay (HOSPITAL_COMMUNITY)
Admission: AD | Admit: 2016-12-29 | Discharge: 2016-12-29 | Discharge status: Against Medical Advice | Payer: Medicaid Other | Source: Ambulatory Visit | Attending: Obstetrics and Gynecology | Admitting: Obstetrics and Gynecology

## 2016-12-29 DIAGNOSIS — Z5321 Procedure and treatment not carried out due to patient leaving prior to being seen by health care provider: Secondary | ICD-10-CM | POA: Insufficient documentation

## 2016-12-29 NOTE — MAU Note (Signed)
Pt had Tubal ligation in March. Started having white discharge and bleeding from site off and on. Was told to use H2O2 on it . It worked for a while but still draining and having some sharp apin in her  abd.

## 2016-12-30 ENCOUNTER — Encounter (HOSPITAL_COMMUNITY): Payer: Self-pay | Admitting: Emergency Medicine

## 2016-12-30 ENCOUNTER — Emergency Department (HOSPITAL_COMMUNITY)
Admission: EM | Admit: 2016-12-30 | Discharge: 2016-12-30 | Disposition: A | Discharge status: Home / Self Care | Payer: Medicaid Other | Attending: Emergency Medicine | Admitting: Emergency Medicine

## 2016-12-30 DIAGNOSIS — Z5321 Procedure and treatment not carried out due to patient leaving prior to being seen by health care provider: Secondary | ICD-10-CM | POA: Insufficient documentation

## 2016-12-30 DIAGNOSIS — R109 Unspecified abdominal pain: Secondary | ICD-10-CM | POA: Insufficient documentation

## 2016-12-30 LAB — URINALYSIS, ROUTINE W REFLEX MICROSCOPIC
Bilirubin Urine: NEGATIVE
Glucose, UA: NEGATIVE mg/dL
Hgb urine dipstick: NEGATIVE
KETONES UR: NEGATIVE mg/dL
Leukocytes, UA: NEGATIVE
Nitrite: NEGATIVE
PROTEIN: 30 mg/dL — AB
Specific Gravity, Urine: 1.032 — ABNORMAL HIGH (ref 1.005–1.030)
pH: 5 (ref 5.0–8.0)

## 2016-12-30 LAB — POC URINE PREG, ED: PREG TEST UR: NEGATIVE

## 2016-12-30 NOTE — ED Notes (Signed)
Pt called PCP and got appointment. Decided to leave prior to being seen since she was going to PCP.

## 2016-12-30 NOTE — ED Triage Notes (Addendum)
Pt reports blood and puss coming from abd incision site for the past month since surgery. Now also having abd pain with this as well. Has had intermittent diarrhea and nausea. Went to [facility] for this yesterday, but left before being seen. Not pregnant.

## 2018-06-27 DIAGNOSIS — Z0389 Encounter for observation for other suspected diseases and conditions ruled out: Secondary | ICD-10-CM | POA: Diagnosis not present

## 2018-06-27 DIAGNOSIS — A609 Anogenital herpesviral infection, unspecified: Secondary | ICD-10-CM | POA: Diagnosis not present

## 2018-06-27 DIAGNOSIS — Z113 Encounter for screening for infections with a predominantly sexual mode of transmission: Secondary | ICD-10-CM | POA: Diagnosis not present

## 2018-06-27 DIAGNOSIS — Z3009 Encounter for other general counseling and advice on contraception: Secondary | ICD-10-CM | POA: Diagnosis not present

## 2018-06-27 DIAGNOSIS — Z1388 Encounter for screening for disorder due to exposure to contaminants: Secondary | ICD-10-CM | POA: Diagnosis not present

## 2019-01-25 DIAGNOSIS — H5201 Hypermetropia, right eye: Secondary | ICD-10-CM | POA: Diagnosis not present

## 2019-01-25 DIAGNOSIS — H52221 Regular astigmatism, right eye: Secondary | ICD-10-CM | POA: Diagnosis not present

## 2019-01-28 DIAGNOSIS — H5213 Myopia, bilateral: Secondary | ICD-10-CM | POA: Diagnosis not present

## 2019-02-08 DIAGNOSIS — Z1159 Encounter for screening for other viral diseases: Secondary | ICD-10-CM | POA: Diagnosis not present

## 2019-02-08 DIAGNOSIS — R0789 Other chest pain: Secondary | ICD-10-CM | POA: Diagnosis not present

## 2019-02-08 DIAGNOSIS — R079 Chest pain, unspecified: Secondary | ICD-10-CM | POA: Diagnosis not present

## 2019-02-08 DIAGNOSIS — R42 Dizziness and giddiness: Secondary | ICD-10-CM | POA: Diagnosis not present

## 2019-02-08 DIAGNOSIS — R0600 Dyspnea, unspecified: Secondary | ICD-10-CM | POA: Diagnosis not present

## 2019-02-08 DIAGNOSIS — Z20828 Contact with and (suspected) exposure to other viral communicable diseases: Secondary | ICD-10-CM | POA: Diagnosis not present

## 2019-02-21 ENCOUNTER — Emergency Department (HOSPITAL_COMMUNITY)
Admission: EM | Admit: 2019-02-21 | Discharge: 2019-02-22 | Discharge status: Against Medical Advice | Payer: Medicaid Other | Attending: Emergency Medicine | Admitting: Emergency Medicine

## 2019-02-21 DIAGNOSIS — G43909 Migraine, unspecified, not intractable, without status migrainosus: Secondary | ICD-10-CM | POA: Insufficient documentation

## 2019-02-21 DIAGNOSIS — R079 Chest pain, unspecified: Secondary | ICD-10-CM | POA: Diagnosis not present

## 2019-02-21 DIAGNOSIS — R0602 Shortness of breath: Secondary | ICD-10-CM | POA: Diagnosis not present

## 2019-02-21 DIAGNOSIS — Z5321 Procedure and treatment not carried out due to patient leaving prior to being seen by health care provider: Secondary | ICD-10-CM | POA: Insufficient documentation

## 2019-02-21 DIAGNOSIS — R0789 Other chest pain: Secondary | ICD-10-CM | POA: Diagnosis present

## 2019-02-22 ENCOUNTER — Encounter (HOSPITAL_COMMUNITY): Payer: Self-pay

## 2019-02-22 ENCOUNTER — Emergency Department (HOSPITAL_COMMUNITY): Payer: Medicaid Other

## 2019-02-22 ENCOUNTER — Other Ambulatory Visit: Payer: Self-pay

## 2019-02-22 DIAGNOSIS — R079 Chest pain, unspecified: Secondary | ICD-10-CM | POA: Diagnosis not present

## 2019-02-22 DIAGNOSIS — R0602 Shortness of breath: Secondary | ICD-10-CM | POA: Diagnosis not present

## 2019-02-22 NOTE — ED Triage Notes (Signed)
Pt reports centralized chest pain that started around 11p. She states that it is hard to take a deep breath and she thinks that she has a migraine too. A&Ox4.

## 2019-03-12 ENCOUNTER — Encounter: Payer: Self-pay | Admitting: Family Medicine

## 2019-03-12 ENCOUNTER — Other Ambulatory Visit: Payer: Self-pay

## 2019-03-12 ENCOUNTER — Other Ambulatory Visit (HOSPITAL_COMMUNITY)
Admission: RE | Admit: 2019-03-12 | Discharge: 2019-03-12 | Disposition: A | Discharge status: Home / Self Care | Payer: Medicaid Other | Source: Ambulatory Visit | Attending: Family Medicine | Admitting: Family Medicine

## 2019-03-12 ENCOUNTER — Telehealth: Payer: Self-pay | Admitting: Family Medicine

## 2019-03-12 ENCOUNTER — Ambulatory Visit (INDEPENDENT_AMBULATORY_CARE_PROVIDER_SITE_OTHER): Payer: Medicaid Other | Admitting: Family Medicine

## 2019-03-12 VITALS — BP 122/68 | HR 72 | Ht 62.0 in | Wt 168.8 lb

## 2019-03-12 DIAGNOSIS — Z0000 Encounter for general adult medical examination without abnormal findings: Secondary | ICD-10-CM

## 2019-03-12 DIAGNOSIS — G43709 Chronic migraine without aura, not intractable, without status migrainosus: Secondary | ICD-10-CM | POA: Diagnosis not present

## 2019-03-12 DIAGNOSIS — B9689 Other specified bacterial agents as the cause of diseases classified elsewhere: Secondary | ICD-10-CM

## 2019-03-12 DIAGNOSIS — N760 Acute vaginitis: Secondary | ICD-10-CM | POA: Diagnosis not present

## 2019-03-12 DIAGNOSIS — Z7689 Persons encountering health services in other specified circumstances: Secondary | ICD-10-CM | POA: Insufficient documentation

## 2019-03-12 DIAGNOSIS — B009 Herpesviral infection, unspecified: Secondary | ICD-10-CM | POA: Insufficient documentation

## 2019-03-12 DIAGNOSIS — Z124 Encounter for screening for malignant neoplasm of cervix: Secondary | ICD-10-CM | POA: Diagnosis not present

## 2019-03-12 DIAGNOSIS — Z202 Contact with and (suspected) exposure to infections with a predominantly sexual mode of transmission: Secondary | ICD-10-CM

## 2019-03-12 DIAGNOSIS — N898 Other specified noninflammatory disorders of vagina: Secondary | ICD-10-CM | POA: Insufficient documentation

## 2019-03-12 LAB — POCT WET PREP (WET MOUNT)
Clue Cells Wet Prep Whiff POC: POSITIVE
Trichomonas Wet Prep HPF POC: ABSENT

## 2019-03-12 MED ORDER — SUMATRIPTAN SUCCINATE 50 MG PO TABS: 50.0000 mg | ORAL_TABLET | ORAL | 1 refills | Status: DC | PRN

## 2019-03-12 MED ORDER — METRONIDAZOLE 500 MG PO TABS: 500.0000 mg | ORAL_TABLET | Freq: Two times a day (BID) | ORAL | 0 refills | Status: AC

## 2019-03-12 NOTE — Assessment & Plan Note (Signed)
Will check for G/C, HIV, RPR at patient request.

## 2019-03-12 NOTE — Patient Instructions (Signed)
Thank you for coming to see me today. It was a pleasure. Today we talked about:   Your discharge: We will contact you with your results in the next few days.  Avoid douching as this can change your vaginal pH.  We also performed a pap and will let you know the results.  Your headaches: I have given you  prescription for a medication to try for your headaches.  You can pick it up at the pharmacy.   If you have any questions or concerns, please do not hesitate to call the office at [phone].  Best,   [place] Constable, DO

## 2019-03-12 NOTE — Assessment & Plan Note (Signed)
Pap performed.  Will update with results.

## 2019-03-12 NOTE — Assessment & Plan Note (Addendum)
New patient packet reviewed with patient.  Reviewed medical, surgical, social, family history with patient, also medications.  Problem list updated.

## 2019-03-12 NOTE — Assessment & Plan Note (Signed)
Wet prep with moderate bacteria, few clue cells, and positive whiff, therefore will treat for Bacterial Vaginosis.   - flagyl 500mg  BID x 7 days

## 2019-03-12 NOTE — Telephone Encounter (Signed)
Called patient back and informed her of wet prep results and that flagyl sent to pharmacy.  Advised to abstain from drinking while taking medication.

## 2019-03-12 NOTE — Assessment & Plan Note (Signed)
No red flag symptoms.   -imitrex 50mg  q2h prn migraine

## 2019-03-12 NOTE — Telephone Encounter (Signed)
Pt called returning a phone call from the office, pt was seen in office today aslo. Please give pt a call back.

## 2019-03-12 NOTE — Progress Notes (Signed)
Subjective: Chief Complaint  Patient presents with  . New Patient (Initial Visit)     HPI: [name] is a 33 y.o. presenting to clinic today to discuss the following:  1 Establish Care Patient presents here to establish care.  Notes that her mother also is a patient here.  Works at [employer], Surveyor, mining, and [employer].    2 Vaginal Discharge Patient reports that discharge started 1 month ago.  She notes that discharge appears clear/creamy/yellow thick and water.  She endorses occasional vaginal odors.  She denies vaginal pruritis, abnormal vaginal bleeding, dysuria, hematuria, pelvic pain, nausea, vomiting, fevers.   She has not tried anythin OTC.   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  She is sexually active, last encounter on 7/8 and does not use condoms.  Contraception: BTL.  Patient's last menstrual period was 03/05/2019 (exact date). She occasionally douches.  3 Headache Onset: many years ago Location: band around head Quality: throbbing Frequency: every other day Precipitating factors: none Prior treatment: tylenol, excedrin migraine, and an Rx years ago that she cannot remember  Associated Symptoms Nausea/vomiting: no  Photophobia/phonophobia: yes  Tearing of eyes: no  Sinus pain/pressure: no  Family hx migraine: no  Personal stressors: yes  Relation to menstrual cycle: no   Red Flags Fever: no  Neck pain/stiffness: no  Vision/speech/swallow/hearing difficulty: no  Focal weakness/numbness: no  Altered mental status: no  Trauma: no  New type of headache: no  Anticoagulant use: no  H/o cancer/HIV/Pregnancy: has been pregnant, BTL in 2018    Health Maintenance: Reports that she had Pap smear with most recent child in 2018.  Reports that she thinks she had an abnormal pap many years ago, but never needed a procedure.     ROS noted in HPI.   Past Medical, Surgical, Social, and Family History  Reviewed & Updated per EMR.   Pertinent Historical Findings include:   Social History   Tobacco Use  Smoking Status Never Smoker  Smokeless Tobacco Never Used      Objective: BP 122/68   Pulse 72   Ht 5\' 2"  (1.575 m)   Wt 168 lb 12.8 oz (76.6 kg)   LMP 03/05/2019 (Exact Date)   SpO2 98%   BMI 30.87 kg/m  Vitals and nursing notes reviewed  Physical Exam:  General: 33 y.o. female in NAD Cardio: RRR no m/r/g Lungs: CTAB, no wheezing, no rhonchi, no crackles, no IWOB on RA Abdomen: Soft, non-tender to palpation, non-distended, positive bowel sounds Skin: warm and dry Extremities: No edema, moves all extremities equally Neuro: CN II-XII grossly intact GU: Pelvic exam performed with patient supine.  Chaperone in room.  Bilateral labia without abnormalities, no inguinal LAD palpated.  Cervix exhibits ectopy with no discharge.  No vaginal lesions.  Vaginal discharge scant, white, thin.    Results for orders placed or performed in visit on 03/12/19 (from the past 72 hour(s))  POCT Wet Prep [facility])     Status: Abnormal   Collection Time: 03/12/19  9:54 AM  Result Value Ref Range   Source Wet Prep POC VAG    WBC, Wet Prep HPF POC 3-8    Bacteria Wet Prep HPF POC Moderate (A) Few   Clue Cells Wet Prep HPF POC Few (A) None   Clue Cells Wet Prep Whiff POC Positive Whiff    Yeast Wet Prep HPF POC None None   Trichomonas Wet  Prep HPF POC Absent Absent    Assessment/Plan:  Encounter for medical examination to establish care New patient packet reviewed with patient.  Reviewed medical, surgical, social, family history with patient, also medications.  Problem list updated.  Bacterial vaginosis Wet prep with moderate bacteria, few clue cells, and positive whiff, therefore will treat for Bacterial Vaginosis.   - flagyl 500mg  BID x 7 days  Encounter for assessment of STD exposure Will check for G/C, HIV, RPR at patient request.  Chronic migraine without aura without status  migrainosus, not intractable No red flag symptoms.   -imitrex 50mg  q2h prn migraine  Encounter for screening for cervical cancer  Pap performed.  Will update with results.     PATIENT EDUCATION PROVIDED: See AVS    Diagnosis and plan along with any newly prescribed medication(s) were discussed in detail with this patient today. The patient verbalized understanding and agreed with the plan. Patient advised if symptoms worsen return to clinic or ER.   Health Maintainance:   Orders Placed This Encounter  Procedures  . HIV antibody (with reflex)  . RPR  . POCT Wet Prep [facility])    Meds ordered this encounter  Medications  . SUMAtriptan (IMITREX) 50 MG tablet    Sig: Take 1 tablet (50 mg total) by mouth every 2 (two) hours as needed for migraine. May repeat in 2 hours if headache persists or recurs.    Dispense:  10 tablet    Refill:  1  . metroNIDAZOLE (FLAGYL) 500 MG tablet    Sig: Take 1 tablet (500 mg total) by mouth 2 (two) times daily for 7 days.    Dispense:  14 tablet    Refill:  [place], DO 03/12/2019, 10:12 AM PGY-2 [place]

## 2019-03-13 LAB — CERVICOVAGINAL ANCILLARY ONLY
Chlamydia: NEGATIVE
Neisseria Gonorrhea: NEGATIVE

## 2019-03-13 LAB — HIV ANTIBODY (ROUTINE TESTING W REFLEX): HIV Screen 4th Generation wRfx: NONREACTIVE

## 2019-03-13 LAB — RPR: RPR Ser Ql: NONREACTIVE

## 2019-03-14 LAB — CYTOLOGY - PAP
Diagnosis: NEGATIVE
HPV: NOT DETECTED

## 2019-03-27 ENCOUNTER — Ambulatory Visit: Payer: Medicaid Other | Admitting: Family Medicine

## 2019-05-02 DIAGNOSIS — K0889 Other specified disorders of teeth and supporting structures: Secondary | ICD-10-CM | POA: Diagnosis not present

## 2019-05-02 DIAGNOSIS — K047 Periapical abscess without sinus: Secondary | ICD-10-CM | POA: Diagnosis not present

## 2019-06-10 DIAGNOSIS — H6691 Otitis media, unspecified, right ear: Secondary | ICD-10-CM | POA: Diagnosis not present

## 2019-06-13 ENCOUNTER — Encounter: Payer: Self-pay | Admitting: Family Medicine

## 2019-06-13 ENCOUNTER — Other Ambulatory Visit (HOSPITAL_COMMUNITY)
Admission: RE | Admit: 2019-06-13 | Discharge: 2019-06-13 | Disposition: A | Discharge status: Home / Self Care | Payer: Medicaid Other | Source: Ambulatory Visit | Attending: Family Medicine | Admitting: Family Medicine

## 2019-06-13 ENCOUNTER — Ambulatory Visit (INDEPENDENT_AMBULATORY_CARE_PROVIDER_SITE_OTHER): Payer: Medicaid Other | Admitting: Family Medicine

## 2019-06-13 ENCOUNTER — Other Ambulatory Visit: Payer: Self-pay

## 2019-06-13 VITALS — BP 102/80 | HR 78 | Ht 62.0 in | Wt 154.1 lb

## 2019-06-13 DIAGNOSIS — G43709 Chronic migraine without aura, not intractable, without status migrainosus: Secondary | ICD-10-CM | POA: Diagnosis not present

## 2019-06-13 DIAGNOSIS — N898 Other specified noninflammatory disorders of vagina: Secondary | ICD-10-CM | POA: Insufficient documentation

## 2019-06-13 DIAGNOSIS — Z7689 Persons encountering health services in other specified circumstances: Secondary | ICD-10-CM | POA: Diagnosis not present

## 2019-06-13 LAB — POCT URINALYSIS DIP (MANUAL ENTRY)
Bilirubin, UA: NEGATIVE
Blood, UA: NEGATIVE
Glucose, UA: NEGATIVE mg/dL
Ketones, POC UA: NEGATIVE mg/dL
Nitrite, UA: NEGATIVE
Protein Ur, POC: NEGATIVE mg/dL
Spec Grav, UA: 1.025 (ref 1.010–1.025)
Urobilinogen, UA: 1 E.U./dL
pH, UA: 6 (ref 5.0–8.0)

## 2019-06-13 LAB — POCT WET PREP (WET MOUNT)
Clue Cells Wet Prep Whiff POC: NEGATIVE
Trichomonas Wet Prep HPF POC: ABSENT

## 2019-06-13 LAB — POCT UA - MICROSCOPIC ONLY

## 2019-06-13 LAB — POCT URINE PREGNANCY: Preg Test, Ur: NEGATIVE

## 2019-06-13 MED ORDER — SUMATRIPTAN SUCCINATE 50 MG PO TABS: 50.0000 mg | ORAL_TABLET | ORAL | 1 refills | Status: DC | PRN

## 2019-06-13 MED ORDER — VALACYCLOVIR HCL 500 MG PO TABS: 500.0000 mg | ORAL_TABLET | Freq: Every day | ORAL | 11 refills | Status: DC

## 2019-06-13 NOTE — Patient Instructions (Signed)
It was great to meet you today! Thank you for letting me participate in your care!  Today, we discussed your symptoms and I have run several tests. If anything is abnormal I will call you directly to discuss treatment.  Be well, Harolyn Rutherford, DO PGY-3, [facility]

## 2019-06-13 NOTE — Progress Notes (Signed)
     Subjective: Chief Complaint  Patient presents with  . Vaginal Discharge   HPI: [name] is a 33 y.o. presenting to clinic today to discuss the following:  Vaginal Discharge and dark urine Patient states she has had 4 days of discharge and dark urine. No pain, no bleeding. She has had unprotected sex with a new partner. No dysuria, no increase in urinary frequency or urgency. She has had no rash in the genital region, no pain during sex, no abdominal pain, no nausea, vomiting, fever, or chills.  ROS noted in HPI.    Social History   Tobacco Use  Smoking Status Never Smoker  Smokeless Tobacco Never Used    Objective: BP 102/80   Pulse 78   Ht 5\' 2"  (1.575 m)   Wt 154 lb 2 oz (69.9 kg)   SpO2 99%   BMI 28.19 kg/m  Vitals and nursing notes reviewed  Physical Exam Exam conducted with a chaperone present.  Constitutional:      General: She is not in acute distress.    Appearance: Normal appearance.  Genitourinary:    General: Normal vulva.     Exam position: Supine.     Pubic Area: No rash.      Labia:        Right: No rash, tenderness, lesion or injury.        Left: No rash, tenderness, lesion or injury.      Vagina: Normal. No signs of injury. No vaginal discharge, erythema, tenderness, bleeding or lesions.     Cervix: Friability and erythema present. No cervical motion tenderness, discharge, lesion or cervical bleeding.     Uterus: Normal.      Adnexa: Right adnexa normal and left adnexa normal.  Neurological:     Mental Status: She is alert.    Assessment/Plan:  Encounter for assessment of STD exposure Patient is concerned about possible STD exposure due to unprotected sex with a new partner. - Counseling provided on health benefits of using protection - HIV, RPR, Wet Prep, and GC/CC ordered. Wet prep negative. HIV negative, RPR negative - U/A normal - Follow up with GC/CC results   PATIENT EDUCATION PROVIDED: See AVS    Diagnosis and plan  along with any newly prescribed medication(s) were discussed in detail with this patient today. The patient verbalized understanding and agreed with the plan. Patient advised if symptoms worsen return to clinic or ER.    Orders Placed This Encounter  Procedures  . RPR  . HIV antibody (with reflex)  . POCT Wet Prep [employer])  . POCT urine pregnancy  . POCT urinalysis dipstick  . POCT UA - Microscopic Only    Meds ordered this encounter  Medications  . valACYclovir (VALTREX) 500 MG tablet    Sig: Take 1 tablet (500 mg total) by mouth daily.    Dispense:  30 tablet    Refill:  11  . SUMAtriptan (IMITREX) 50 MG tablet    Sig: Take 1 tablet (50 mg total) by mouth every 2 (two) hours as needed for migraine. May repeat in 2 hours if headache persists or recurs.    Dispense:  10 tablet    Refill:  [place], DO 06/13/2019, 1:36 PM PGY-3 [place]

## 2019-06-14 LAB — RPR: RPR Ser Ql: NONREACTIVE

## 2019-06-17 ENCOUNTER — Encounter: Payer: Self-pay | Admitting: Family Medicine

## 2019-06-17 NOTE — Assessment & Plan Note (Signed)
Patient is concerned about possible STD exposure due to unprotected sex with a new partner. - Counseling provided on health benefits of using protection - HIV, RPR, Wet Prep, and GC/CC ordered. Wet prep negative. HIV negative, RPR negative - U/A normal - Follow up with GC/CC results

## 2019-06-18 LAB — CERVICOVAGINAL ANCILLARY ONLY
Chlamydia: NEGATIVE
Comment: NEGATIVE
Comment: NORMAL
Neisseria Gonorrhea: NEGATIVE

## 2019-06-19 ENCOUNTER — Encounter: Payer: Self-pay | Admitting: Family Medicine

## 2019-06-19 NOTE — Progress Notes (Signed)
Normal results to patient

## 2019-07-22 ENCOUNTER — Ambulatory Visit (INDEPENDENT_AMBULATORY_CARE_PROVIDER_SITE_OTHER): Payer: Medicaid Other | Admitting: Family Medicine

## 2019-07-22 ENCOUNTER — Other Ambulatory Visit: Payer: Self-pay

## 2019-07-22 VITALS — BP 100/70 | HR 84 | Ht 62.0 in | Wt 149.0 lb

## 2019-07-22 DIAGNOSIS — F419 Anxiety disorder, unspecified: Secondary | ICD-10-CM

## 2019-07-22 DIAGNOSIS — D649 Anemia, unspecified: Secondary | ICD-10-CM

## 2019-07-22 DIAGNOSIS — Z23 Encounter for immunization: Secondary | ICD-10-CM

## 2019-07-22 MED ORDER — SERTRALINE HCL 25 MG PO TABS: 25.0000 mg | ORAL_TABLET | Freq: Every day | ORAL | 0 refills | Status: DC

## 2019-07-22 MED ORDER — ALBUTEROL SULFATE HFA 108 (90 BASE) MCG/ACT IN AERS: 1.0000 | INHALATION_SPRAY | Freq: Four times a day (QID) | RESPIRATORY_TRACT | 1 refills | Status: DC | PRN

## 2019-07-22 NOTE — Patient Instructions (Signed)
It was great to see you!  Our plans for today:  - We are starting a medication to help with your anxiety. - It is recommended to see a counselor.  See below for counselors to take Medicaid. - Try deep breathing or check out some of the free apps and websites listed below to help when you feel your are getting an attack. - We refilled your albuterol inhaler. - Come back in 2-4 weeks for follow-up.  We are checking some labs today, we will call you or send you a letter if they are abnormal.   Take care and seek immediate care sooner if you develop any concerns.   Dr. [facility]   Mental Health Apps and Websites Here are a few free apps meant to help you to help yourself.  To find, try searching on the internet to see if the app is offered on Apple/Android devices. If your first choice doesn't come up on your device, the good news is that there are many choices! Play around with different apps to see which ones are helpful to you . Calm This is an app meant to help increase calm feelings. Includes info, strategies, and tools for tracking your feelings.   Calm Harm  This app is meant to help with self-harm. Provides many 5-minute or 15-min coping strategies for doing instead of hurting yourself.    [place] is a problem-solving tool to help deal with emotions and cope with stress you encounter wherever you are.    MindShift This app can help people cope with anxiety. Rather than trying to avoid anxiety, you can make an important shift and face it.    MY3  MY3 features a support system, safety plan and resources with the goal of offering a tool to use in a time of need.    My Life My Voice  This mood journal offers a simple solution for tracking your thoughts, feelings and moods. Animated emoticons can help identify your mood.   Relax Melodies Designed to help with sleep, on this app you can mix sounds and meditations for relaxation.    Smiling Mind  Smiling Mind is meditation made easy: it's a simple tool that helps put a smile on your mind.    Stop, Breathe & Think  A friendly, simple guide for people through meditations for mindfulness and compassion.  Stop, Breathe and Think Kids Enter your current feelings and choose a "mission" to help you cope. Offers videos for certain moods instead of just sound recordings.     The [place] Box The [place] Box (VHB) contains simple tools to help patients with coping, relaxation, distraction, and positive thinking.     Therapy and Counseling Resources Most providers on this list will take Medicaid. Patients with commercial insurance or Medicare should contact their insurance company to get a list of in network providers.  [place] [address] [place], [place] [address]       [phone]     [facility] [address], [place], [place]    Jinny Blossom [facility] [address], [place], [place]  Family Solutions:  [place]. [place] [phone]  [facility]:  [address], [place]  [facility] (under & uninsured) [address], [place] [phone]    [email or web address]    Mental  [facility] [place]     Phone:  [phone]     [place]  [employer] [facility]      [place], [place] ext [place]: [place], [place], [place]   [place] ([place] therapist) [place] [address]   [place] [address]    [phone]    [facility]   [place],  [place], [place]   [place]  [place]  [facility]   [address] [place], [place]        [phone]  Open Access/Walk In Clinic under & uninsured [place],  [address] [place] [phone]):  Mon - Fri from 8 AM - 3 PM  [facility] [place],  ([place])   [place], [place]: [phone]) 8:30 - 12; 1 - 2:30  [facility] [place],  [place], [place]    [phone]):8:30 - 12; 2 - 3PM  [facility] [employer],  [address],  [place]; [phone]):   Mon - Fri 8 AM - 5 PM  [facility] [place]  MWF 12:30 to 3:00 or call to schedule an appointment  [phone]  Specific Provider options Psychology Today  https://www.psychologytoday.com/us 1. click on find a therapist  2. enter your zip code 3. left side and select or tailor a therapist for your specific need.   [facility] Provider Directory [email or web address]  (Medicaid)   Follow all drop down to find a provider  [place] or [email or web address] [address], [place], [place] Recovery support and educational   In home counseling [place] Telephone: [phone]  office in [place] [email or web address]   Does not take reg. Medicaid or Medicare private insurance [facility], [place] health Choice, [facility], [place], [place], [place], [place] Health Choice  24- Hour Availability:  . [place] or [phone]  . Family Service of the [employer] [phone]  [facility] Crisis Service  [phone]   . [place]  [phone] (after hours)  . Therapeutic Alternative/Mobile Crisis   [phone]  . [place] National Suicide Hotline  [phone] ([place])  . Call 911 or go to emergency room  . [employer]  [phone]);  [place] and [employer]   . [facility] ACCESS  [phone]);  [place], [place], [place], [place], [place], [place], [place]

## 2019-07-22 NOTE — Assessment & Plan Note (Addendum)
Has longstanding history, currently uncontrolled, GAD 17 today.  Symptoms most consistent with panic attack.  Considered but extremely low likelihood of having cardiac or pulmonary etiology for symptoms given symptoms resolve with improvement in anxiety, is currently well-appearing with normal exam and normal vital signs with negative risk factors.  Will however recheck CBC given concern about anemia contributing to her symptoms though expect to this to be within normal limits.  Will restart SSRI treatment with Zoloft at starting dose.  Provided resources for counseling.  Follow-up in 2-4 weeks, at that time can consider increasing Zoloft if symptoms are not well controlled.

## 2019-07-22 NOTE — Progress Notes (Signed)
  Subjective:   Patient ID: [name]    DOB: [birth year], 33 y.o. female   MRN: [id number]  [name] is a 33 y.o. female with a history of migraine, HSV here for   Anxiety - having ongoing issues with panic and anxiety attacks. - Symptoms include: her chest tightens up. Is hard to catch her breath. Dizzy. Feels generalized weakness afterwards. Resolves with calming down or laying down. Sometimes lasts for an hour. Comes on with stressful situations. - Attacks occur about 4 times per week. - Main stressor is work and trying to so much at one time. Works at [employer]. 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  Not currently on iron supplementation. - Has taken albuterol, took 6 puffs, didn't help. - Has previously been on sertraline in the past which helped but it has been a while since she has been on any medication. - Has never seen counseling - Her boyfriend is a great support to her. - Denies SI, exertional chest pain or nausea.  Review of Systems:  Per HPI.  Medications and smoking status reviewed.  Objective:   BP 100/70   Pulse 84   Ht 5\' 2"  (1.575 m)   Wt 149 lb (67.6 kg)   LMP 07/22/2019   SpO2 99%   BMI 27.25 kg/m  Vitals and nursing note reviewed.  General: well nourished, well developed, in no acute distress with non-toxic appearance CV: regular rate and rhythm without murmurs, rubs, or gallops, no lower extremity edema Lungs: clear to auscultation bilaterally with normal work of breathing Psych: Well groomed and appropriately dressed.  No flight of ideas or tangential thought process.  No SI.  GAD 7 : Generalized Anxiety Score 07/22/2019  Nervous, Anxious, on Edge 3  Control/stop worrying 3  Worry too much - different things 3  Trouble relaxing 2  Restless 2  Easily annoyed or irritable 2  Afraid - awful might happen 2  Total GAD 7 Score 17  Anxiety Difficulty Very difficult     Assessment & Plan:   Anxiety Has longstanding history, currently uncontrolled, GAD 17 today.  Symptoms most consistent with panic attack.  Considered but extremely low likelihood of having cardiac or pulmonary etiology for symptoms given symptoms resolve with improvement in anxiety, is currently well-appearing with normal exam and normal vital signs with negative risk factors.  Will however recheck CBC given concern about anemia contributing to her symptoms though expect to this to be within normal limits.  Will restart SSRI treatment with Zoloft at starting dose.  Provided resources for counseling.  Follow-up in 2-4 weeks, at that time can consider increasing Zoloft if symptoms are not well controlled.  Orders Placed This Encounter  Procedures  . Flu Vaccine QUAD 36+ mos IM  . CBC   Meds ordered this encounter  Medications  . albuterol (VENTOLIN HFA) 108 (90 Base) MCG/ACT inhaler    Sig: Inhale 1-2 puffs into the lungs every 6 (six) hours as needed for wheezing or shortness of breath.    Dispense:  8 g    Refill:  1  . sertraline (ZOLOFT) 25 MG tablet    Sig: Take 1 tablet (25 mg total) by mouth daily.    Dispense:  90 tablet    Refill:  0    Rory Percy, DO PGY-3, [facility] 07/22/2019 11:22 AM

## 2019-07-23 LAB — CBC
Hematocrit: 33.7 % — ABNORMAL LOW (ref 34.0–46.6)
Hemoglobin: 11.3 g/dL (ref 11.1–15.9)
MCH: 27.7 pg (ref 26.6–33.0)
MCHC: 33.5 g/dL (ref 31.5–35.7)
MCV: 83 fL (ref 79–97)
Platelets: 329 10*3/uL (ref 150–450)
RBC: 4.08 x10E6/uL (ref 3.77–5.28)
RDW: 13.7 % (ref 11.7–15.4)
WBC: 3.9 10*3/uL (ref 3.4–10.8)

## 2019-09-17 ENCOUNTER — Ambulatory Visit (INDEPENDENT_AMBULATORY_CARE_PROVIDER_SITE_OTHER): Payer: Medicaid Other | Admitting: Family Medicine

## 2019-09-17 ENCOUNTER — Encounter: Payer: Self-pay | Admitting: Family Medicine

## 2019-09-17 ENCOUNTER — Other Ambulatory Visit: Payer: Self-pay

## 2019-09-17 DIAGNOSIS — G43709 Chronic migraine without aura, not intractable, without status migrainosus: Secondary | ICD-10-CM

## 2019-09-17 DIAGNOSIS — W57XXXA Bitten or stung by nonvenomous insect and other nonvenomous arthropods, initial encounter: Secondary | ICD-10-CM

## 2019-09-17 MED ORDER — SUMATRIPTAN SUCCINATE 50 MG PO TABS: 50.0000 mg | ORAL_TABLET | ORAL | 0 refills | Status: DC | PRN

## 2019-09-17 NOTE — Patient Instructions (Signed)
It was great meeting you today!  I am sorry about your bug bites.  These bites are definitely from an insect although it is difficult to say if they are from bedbugs, fleas, or perhaps another type of insect.  Regardless the good news is that it is not scabies.  To help with the itching you can pick up some hydrocortisone cream over-the-counter.  The worsening of the bites is likely due to the itching, if we can control the itching they will heal up just fine.

## 2019-09-17 NOTE — Progress Notes (Signed)
   HPI 34 year old female who presents for bug bites.  She [place] staying [place] a cabin for her [place] when she developed small raised lesions on her hands, shoulder, face.  Her small son who was staying in the same area of the cabin also developed similar symptoms.  This occurred over the weekend and she left the cabin roughly 3 days ago.  She feels that the bites have been getting worse since she left.  She tried a very generic "bite cream" which did not help.  CC: Bug bites   ROS:   Review of Systems See HPI for ROS.   CC, SH/smoking status, and VS noted  Objective: BP 122/75   Pulse 85   Wt 149 lb (67.6 kg)   SpO2 100%   BMI 27.25 kg/m  Gen: Very pleasant 34 year old female, no acute distress CV: Skin warm and dry Resp: No accessory muscle use Neuro: Alert and oriented, Speech clear, No gross deficits Derm: Multiple small, well-circumscribed, papules with mild excoriation noted.  These are located on her left forehead, left hand, right shoulder          Assessment and plan:  Arthropod bite Multiple arthropod bites.  Not felt to be due to scabies as there are no tracts and the distribution is abnormal.  Likely bedbugs although could potentially be fleas or some other sort of insect.  Regardless can take hydrocortisone cream 1% a couple times per day for about a week to help with the itching.  Can follow-up as needed.   No orders of the defined types were placed in this encounter.   Meds ordered this encounter  Medications  . SUMAtriptan (IMITREX) 50 MG tablet    Sig: Take 1 tablet (50 mg total) by mouth every 2 (two) hours as needed for migraine. May repeat in 2 hours if headache persists or recurs.    Dispense:  30 tablet    Refill:  0     Guadalupe Dawn MD PGY-3 Family Medicine Resident  09/17/2019 2:12 PM

## 2019-09-17 NOTE — Assessment & Plan Note (Signed)
Multiple arthropod bites.  Not felt to be due to scabies as there are no tracts and the distribution is abnormal.  Likely bedbugs although could potentially be fleas or some other sort of insect.  Regardless can take hydrocortisone cream 1% a couple times per day for about a week to help with the itching.  Can follow-up as needed.

## 2019-10-22 ENCOUNTER — Other Ambulatory Visit (HOSPITAL_COMMUNITY)
Admission: RE | Admit: 2019-10-22 | Discharge: 2019-10-22 | Disposition: A | Discharge status: Home / Self Care | Payer: Medicaid Other | Source: Ambulatory Visit | Attending: Family Medicine | Admitting: Family Medicine

## 2019-10-22 ENCOUNTER — Ambulatory Visit (INDEPENDENT_AMBULATORY_CARE_PROVIDER_SITE_OTHER): Payer: Medicaid Other | Admitting: Family Medicine

## 2019-10-22 ENCOUNTER — Other Ambulatory Visit: Payer: Self-pay

## 2019-10-22 ENCOUNTER — Other Ambulatory Visit: Payer: Self-pay | Admitting: Family Medicine

## 2019-10-22 ENCOUNTER — Encounter: Payer: Self-pay | Admitting: Family Medicine

## 2019-10-22 VITALS — BP 110/80 | HR 78 | Wt 152.0 lb

## 2019-10-22 DIAGNOSIS — Z113 Encounter for screening for infections with a predominantly sexual mode of transmission: Secondary | ICD-10-CM | POA: Insufficient documentation

## 2019-10-22 DIAGNOSIS — A599 Trichomoniasis, unspecified: Secondary | ICD-10-CM | POA: Diagnosis not present

## 2019-10-22 LAB — POCT WET PREP (WET MOUNT): Clue Cells Wet Prep Whiff POC: POSITIVE

## 2019-10-22 MED ORDER — METRONIDAZOLE 500 MG PO TABS: 500.0000 mg | ORAL_TABLET | Freq: Two times a day (BID) | ORAL | 0 refills | Status: DC

## 2019-10-22 NOTE — Progress Notes (Signed)
    SUBJECTIVE:   CHIEF COMPLAINT / HPI:   Vaginal discharge - 1week.  Whiteish/clear discharge.  No pain/itching.  One partner for past month.  Not using condoms.  Has had tubes tied. No abdominal pain or fevers.    Doesn't know if she's had BV before.    PERTINENT  PMH / PSH: chronic migraine, anxiety  OBJECTIVE:   BP 110/80   Pulse 78   Wt 152 lb (68.9 kg)   LMP 10/15/2019 (Approximate)   SpO2 100%   BMI 27.80 kg/m   General: alert and oriented.  Appears stated age.  No acute distress GU: no vulvovaginal lesions.  No vaginal/cervical bleeding.  Minimal grey/clear discharge present near cervix.    ASSESSMENT/PLAN:   No problem-specific Assessment & Plan notes found for this encounter.     Benay Pike, MD [place]

## 2019-10-22 NOTE — Patient Instructions (Signed)
Please take your medication twice a day for 7 days.    Your partner cn take 4 pills at once.    Do not have intercourse for seven days from the time you start the medication.    I will call with the results of the other tests.    Have a nice day  Clemetine Marker, md.

## 2019-10-24 LAB — CERVICOVAGINAL ANCILLARY ONLY
Chlamydia: NEGATIVE
Comment: NEGATIVE
Comment: NEGATIVE
Comment: NORMAL
Neisseria Gonorrhea: NEGATIVE
Trichomonas: POSITIVE — AB

## 2019-10-25 ENCOUNTER — Telehealth: Payer: Self-pay | Admitting: Family Medicine

## 2019-10-25 NOTE — Telephone Encounter (Signed)
Called pt to ask her some demographic info about her partner to fill out the expedited partner therapy form.  Pt stated she didn't know that info at this time and would call back our office with that info.  If she calls back I need the name and any one of the following: dob, phone number, or address.

## 2019-10-26 DIAGNOSIS — A599 Trichomoniasis, unspecified: Secondary | ICD-10-CM | POA: Insufficient documentation

## 2019-10-26 NOTE — Assessment & Plan Note (Addendum)
Complains only of vaginal discharge for one week.  New partner for past month. Not using condoms. Wet prep was positive for trichomonas.  Attempted Expedited partner therapy but pt did not have enough info about partner to fill out form.  - flagyl - f/u with EPT.  - f/u gc/ct swab results - future orders for hiv/rpr

## 2020-01-07 ENCOUNTER — Other Ambulatory Visit: Payer: Self-pay

## 2020-01-07 ENCOUNTER — Other Ambulatory Visit (HOSPITAL_COMMUNITY)
Admission: RE | Admit: 2020-01-07 | Discharge: 2020-01-07 | Disposition: A | Discharge status: Home / Self Care | Payer: Medicaid Other | Source: Ambulatory Visit | Attending: Family Medicine | Admitting: Family Medicine

## 2020-01-07 ENCOUNTER — Ambulatory Visit (INDEPENDENT_AMBULATORY_CARE_PROVIDER_SITE_OTHER): Payer: Medicaid Other | Admitting: Family Medicine

## 2020-01-07 ENCOUNTER — Ambulatory Visit: Payer: Medicaid Other

## 2020-01-07 VITALS — BP 100/66 | HR 86 | Wt 161.0 lb

## 2020-01-07 DIAGNOSIS — Z113 Encounter for screening for infections with a predominantly sexual mode of transmission: Secondary | ICD-10-CM | POA: Insufficient documentation

## 2020-01-07 DIAGNOSIS — G43709 Chronic migraine without aura, not intractable, without status migrainosus: Secondary | ICD-10-CM

## 2020-01-07 LAB — POCT WET PREP (WET MOUNT)
Clue Cells Wet Prep Whiff POC: NEGATIVE
Trichomonas Wet Prep HPF POC: ABSENT

## 2020-01-07 MED ORDER — SUMATRIPTAN SUCCINATE 50 MG PO TABS: 50.0000 mg | ORAL_TABLET | ORAL | 0 refills | Status: DC | PRN

## 2020-01-07 NOTE — Assessment & Plan Note (Signed)
Refilled Imitrex.  30 tablets given.  50 mg every 2 hours as needed.

## 2020-01-07 NOTE — Patient Instructions (Signed)
It was great seeing you today!  We performed a wet prep which is a swab was on to test for yeast, bacterial vaginosis, and trichomoniasis.  They will come out later today, I will give you call if there are any positive results.  We also performed a gonorrhea/chlamydia swab.  They will come back probably tomorrow but sometimes the day after.  I will give you call and that comes back.

## 2020-01-07 NOTE — Progress Notes (Signed)
   HPI 34 year old female who presents for 1 day history of vaginal discharge.  Patient states that she has been sexually active and is interested in getting tested for trichomoniasis, GC/chlamydia.  Visits that she also has a history of BV and wonders what this might be that as well.  Discharge has not been malodorous as far she knows and is clear.  No other symptoms no other issues.  She states that her menstrual cycle just ended 24 to 36 hours ago.  She is also requesting a refill on her Imitrex.  CC: Vaginal discharge  ROS:   Review of Systems See HPI for ROS.   CC, SH/smoking status, and VS noted  Objective: BP 100/66   Pulse 86   Wt 161 lb (73 kg)   LMP 01/02/2020   SpO2 99%   BMI 29.45 kg/m  Gen: 34 year old African-American female, no acute distress, resting comfortably CV: Skin warm and dry Resp: No sensory muscle use, no respiratory stress Abd: SNTND, BS present, no guarding or organomegaly Neuro: Alert and oriented, Speech clear, No gross deficits  GU: Cervix well visualized.  No signs of atrophy or friability.  No malodorous discharge appreciated.  Couple of scant specks of blood noted.   Assessment and plan:  Screening examination for STD (sexually transmitted disease) Performed wet prep and GC/chlamydia.  Will contact patient and update plan if results are positive.  Deferred blood testing for RPR and HIV at patient request.  Chronic migraine without aura without status migrainosus, not intractable Refilled Imitrex.  30 tablets given.  50 mg every 2 hours as needed.   Orders Placed This Encounter  Procedures  . POCT Wet Prep [facility])    Meds ordered this encounter  Medications  . SUMAtriptan (IMITREX) 50 MG tablet    Sig: Take 1 tablet (50 mg total) by mouth every 2 (two) hours as needed for migraine. May repeat in 2 hours if headache persists or recurs.    Dispense:  30 tablet    Refill:  0     Guadalupe Dawn MD PGY-3 Family Medicine Resident    01/07/2020 2:38 PM

## 2020-01-07 NOTE — Assessment & Plan Note (Signed)
Performed wet prep and GC/chlamydia.  Will contact patient and update plan if results are positive.  Deferred blood testing for RPR and HIV at patient request.

## 2020-01-08 LAB — CERVICOVAGINAL ANCILLARY ONLY
Chlamydia: NEGATIVE
Comment: NEGATIVE
Comment: NEGATIVE
Comment: NORMAL
Neisseria Gonorrhea: NEGATIVE
Trichomonas: NEGATIVE

## 2020-03-05 ENCOUNTER — Other Ambulatory Visit: Payer: Self-pay | Admitting: Family Medicine

## 2020-03-25 ENCOUNTER — Ambulatory Visit: Payer: Medicaid Other | Admitting: Family Medicine

## 2020-05-04 ENCOUNTER — Other Ambulatory Visit: Payer: Self-pay

## 2020-05-04 ENCOUNTER — Other Ambulatory Visit: Payer: Medicaid Other

## 2020-05-04 DIAGNOSIS — Z20822 Contact with and (suspected) exposure to covid-19: Secondary | ICD-10-CM | POA: Diagnosis not present

## 2020-05-06 ENCOUNTER — Telehealth: Payer: Self-pay

## 2020-05-06 LAB — NOVEL CORONAVIRUS, NAA: SARS-CoV-2, NAA: NOT DETECTED

## 2020-05-06 LAB — SARS-COV-2, NAA 2 DAY TAT

## 2020-05-06 NOTE — Telephone Encounter (Signed)
Negative COVID results given. Patient results "NOT Detected." Caller expressed understanding. ° °

## 2020-05-14 ENCOUNTER — Ambulatory Visit (INDEPENDENT_AMBULATORY_CARE_PROVIDER_SITE_OTHER): Payer: Medicaid Other | Admitting: Family Medicine

## 2020-05-14 ENCOUNTER — Encounter: Payer: Self-pay | Admitting: Family Medicine

## 2020-05-14 ENCOUNTER — Other Ambulatory Visit: Payer: Self-pay

## 2020-05-14 ENCOUNTER — Other Ambulatory Visit (HOSPITAL_COMMUNITY)
Admission: RE | Admit: 2020-05-14 | Discharge: 2020-05-14 | Disposition: A | Discharge status: Home / Self Care | Payer: Medicaid Other | Source: Ambulatory Visit | Attending: Family Medicine | Admitting: Family Medicine

## 2020-05-14 DIAGNOSIS — N898 Other specified noninflammatory disorders of vagina: Secondary | ICD-10-CM | POA: Insufficient documentation

## 2020-05-14 DIAGNOSIS — Z113 Encounter for screening for infections with a predominantly sexual mode of transmission: Secondary | ICD-10-CM

## 2020-05-14 DIAGNOSIS — Z3200 Encounter for pregnancy test, result unknown: Secondary | ICD-10-CM

## 2020-05-14 DIAGNOSIS — R109 Unspecified abdominal pain: Secondary | ICD-10-CM | POA: Diagnosis not present

## 2020-05-14 LAB — POCT URINALYSIS DIP (MANUAL ENTRY)
Bilirubin, UA: NEGATIVE
Blood, UA: NEGATIVE
Glucose, UA: NEGATIVE mg/dL
Ketones, POC UA: NEGATIVE mg/dL
Leukocytes, UA: NEGATIVE
Nitrite, UA: NEGATIVE
Protein Ur, POC: NEGATIVE mg/dL
Spec Grav, UA: 1.025 (ref 1.010–1.025)
Urobilinogen, UA: 1 E.U./dL
pH, UA: 7 (ref 5.0–8.0)

## 2020-05-14 LAB — POCT WET PREP (WET MOUNT)
Clue Cells Wet Prep Whiff POC: NEGATIVE
Trichomonas Wet Prep HPF POC: ABSENT

## 2020-05-14 LAB — POCT URINE PREGNANCY: Preg Test, Ur: NEGATIVE

## 2020-05-14 NOTE — Progress Notes (Addendum)
° ° °  SUBJECTIVE:   CHIEF COMPLAINT / HPI:   Chief Complaint  Patient presents with   Possible Pregnancy    [name] is a 34 y.o. female presents for vaginal discharge.  Vaginal Discharge Having malodorous "salty" smelling vaginal discharge for 3 weeks. Has some periumbical abdominal pain and intermittent pelvic pain. States certain smells make her nauseous. She has vomited after drinking liquor a birthday party.  Discharge consistency: creamy Discharge color: white and sometimes clear  Medications tried: nothing No recent antibiotic use  - Denies  itching, burning, dysuria or hematuria, fevers. - Sexully active with one ale partner for the past 8 months.  - LMP 04/18/20.  -Contraception" tubal ligation  - Patient reports trichomoniasis, BV, yeast in the past.  - Patient states she douches but has not done it in the past month.    Symptoms Fever: no Dysuria:no Vaginal bleeding: no Abdomen or Pelvic pain: yes  Back pain: no Genital sores or ulcers:no Rash: no Pain during sex: no Odor : yes     PERTINENT  PMH / PSH: reviewed and updated as appropriate   OBJECTIVE:   There were no vitals taken for this visit.  GEN: well appearing female in no acute distress  CVS: well perfused  RESP: speaking in full sentences without pause  ABD: soft, non-tender, non-distended, no palpable masses  Pelvic exam: normal external genitalia, vulva, VAGINA and CERVIX: normal appearing cervix without discharge or lesions, ADNEXA: normal adnexa in size, nontender and no masses, WET MOUNT done - results: negative for pathogens, normal epithelial cells, KOH done, DNA probe for chlamydia and GC obtained,  exam chaperoned by CMA.     ASSESSMENT/PLAN:   Vaginal discharge UA normal. Urine pregnancy test negative. Wet prep negative for yeast, BV and trich. Advised abstaining from coitus until all testing returns  - F/U if symptoms not improving or getting worse.  - Will f/u on G/C  Chlamydia and call in Rx if positive.  - Self care instructions given including avoiding douching. - F/U with PCP as needed.  - Return precautions including abdominal pain, fever, chills, nausea, or vomiting given.                 [name], [place]

## 2020-05-14 NOTE — Assessment & Plan Note (Deleted)
UA normal. Wet prep negative for yeast, BV and trich. Advised abstaining from coitus until all testing returns  - F/U if symptoms not improving or getting worse.  - Will f/u on G/C Chlamydia and call in Rx if positive.  - Self care instructions given including avoiding douching. Handout given.  - F/U with PCP as needed.  - Return precautions including abdominal pain, fever, chills, nausea, or vomiting given.

## 2020-05-14 NOTE — Patient Instructions (Signed)
It was great seeing you today!   - I you need treatment, please check MyChart or I will call with further recommendations.    If you have questions or concerns please do not hesitate to call at [phone].  Dr. Rushie [facility]

## 2020-05-14 NOTE — Assessment & Plan Note (Addendum)
UA normal. Urine pregnancy test negative. Wet prep negative for yeast, BV and trich. Advised abstaining from coitus until all testing returns  - F/U if symptoms not improving or getting worse.  - Will f/u on G/C Chlamydia and call in Rx if positive.  - Self care instructions given including avoiding douching. - F/U with PCP as needed.  - Return precautions including abdominal pain, fever, chills, nausea, or vomiting given.

## 2020-05-15 LAB — CERVICOVAGINAL ANCILLARY ONLY
Chlamydia: NEGATIVE
Comment: NEGATIVE
Comment: NORMAL
Neisseria Gonorrhea: NEGATIVE

## 2020-05-20 ENCOUNTER — Ambulatory Visit: Payer: Medicaid Other

## 2020-06-19 ENCOUNTER — Other Ambulatory Visit: Payer: Self-pay

## 2020-06-19 ENCOUNTER — Ambulatory Visit (INDEPENDENT_AMBULATORY_CARE_PROVIDER_SITE_OTHER): Payer: Medicaid Other | Admitting: Family Medicine

## 2020-06-19 DIAGNOSIS — G43709 Chronic migraine without aura, not intractable, without status migrainosus: Secondary | ICD-10-CM

## 2020-06-19 DIAGNOSIS — F411 Generalized anxiety disorder: Secondary | ICD-10-CM | POA: Diagnosis not present

## 2020-06-19 DIAGNOSIS — F410 Panic disorder [episodic paroxysmal anxiety] without agoraphobia: Secondary | ICD-10-CM | POA: Diagnosis not present

## 2020-06-19 MED ORDER — HYDROXYZINE PAMOATE 25 MG PO CAPS: 25.0000 mg | ORAL_CAPSULE | Freq: Three times a day (TID) | ORAL | 0 refills | Status: DC | PRN

## 2020-06-19 MED ORDER — SUMATRIPTAN SUCCINATE 50 MG PO TABS: 50.0000 mg | ORAL_TABLET | ORAL | 0 refills | Status: DC | PRN

## 2020-06-19 MED ORDER — SERTRALINE HCL 25 MG PO TABS
100.0000 mg | ORAL_TABLET | Freq: Every day | ORAL | 2 refills | Status: DC
Start: 2020-06-19 — End: 2020-10-19
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
## 2020-06-19 NOTE — Assessment & Plan Note (Signed)
Likely worsened recently as a result of her poorly controlled anxiety symptoms.  She is encouraged to move forward with her treatment for anxiety and to reassess these headaches in 1 month. -Sumatriptan refilled

## 2020-06-19 NOTE — Patient Instructions (Addendum)
Anxiety with panic attacks: I am sorry to hear that you have had such difficult symptoms lately.  For now, we will increase your sertraline to 50 mg daily I would like you to continue to increase the dose by 25 mg each week until you are taking 100 mg daily.  Once you are at 100 mg daily, continue taking that dose regularly.  I think will be really beneficial for you to also establish care with a therapist.  Please see the list of therapist below to call to establish care.  Headaches: I have refilled your sumatriptan which she can use for migraine headaches.  Remember that you can still use Tylenol for tension type headaches which generally feel like a dull aching all around her head.  Please come back to clinic in 3-4 weeks so he can follow-up in see how this medication has been helping.   Therapy and Counseling Resources Most providers on this list will take Medicaid. Patients with commercial insurance or Medicare should contact their insurance company to get a list of in network providers.  [facility]:Psychiatry and Counseling [address] [facility] [place]. [place], [place] [address] [place]  [address] [place], [place] [address]      [place] [address]  [place], [place] [address] [phone]  [place] [address], [place]  [place], [place] [address]       [phone]      Jinny Bl[facility]om [facility] [address], [place], [place]  Family Solutions:  [place]. [place] [phone]  [facility]:  [place] [address] [phone]  [facility] (under & uninsured) [address] [place] [phone]    [email or web address]    [place] [address] . [place]    [phone]  [facility] [place]     Phone:  [phone]     [place]  [place] [address]      [place], [place] ext [place]: [place], [place], [place]   [place] ([place] therapist) [email or web address]  [address]   [place] [address]    [phone]    [facility]   [address],  [place], [place]   [place]  [place]  [facility]  [address] [place], [place]        [phone]  Open Access/Walk In Clinic under & uninsured  [facility]  [address] [place], [place] Crisis [phone]  Family [facility] of the [place],  ([place])   [place], [place]: [phone]) 8:30 - 12; 1 - 2:30  Family Service of the [place],  [place], [place]    ([phone]):8:30 - 12; 2 - 3PM  [facility] [employer],  [address],  [place]; [phone]):   Mon - Fri 8 AM - 5 PM  [facility] [place]  MWF 12:30 to 3:00 or call to schedule an appointment  [phone]  Specific Provider options Psychology Today  [email or web address] 1. click  on find a therapist  2. enter your zip code 3. left side and select or tailor a therapist for your specific need.   [facility] Provider Directory [email or web address]  (Medicaid)   Follow all drop down to find a provider  [place] [phone] or [email or web address] [address], [place], [place] Recovery support and educational   24- Hour Availability:  .  [facility]  . [place], [place] Crisis [phone]  . Family Service of the  [employer] [phone]  [facility] Crisis Service  [phone]   . [place]  [phone] (after hours)  . Therapeutic Alternative/Mobile Crisis   [phone]  . [place] National Suicide Hotline  559-375-2298 (Newborn)  . Call 911 or go to emergency room  . [employer]  [phone]);  [place] and [employer]   . [place] ACCESS  [phone]); [place], [place], [place], [place], [address] [address], [place], [place]

## 2020-06-19 NOTE — Assessment & Plan Note (Signed)
Significantly worsened symptoms of anxiety on her subtherapeutic dose of sertraline.  We will plan to increase by 20 mg every week until she reaches a dose of 100 mg daily.  She was given instructions. -Increase sertraline until a dose of 100 mg daily -Hydroxyzine as needed for anxiety attacks -List of therapists provided -Follow-up in 3-4 weeks to reassess symptoms

## 2020-07-30 ENCOUNTER — Other Ambulatory Visit (HOSPITAL_COMMUNITY)
Admission: RE | Admit: 2020-07-30 | Discharge: 2020-07-30 | Disposition: A | Discharge status: Home / Self Care | Payer: Medicaid Other | Source: Ambulatory Visit | Attending: Family Medicine | Admitting: Family Medicine

## 2020-07-30 ENCOUNTER — Ambulatory Visit (INDEPENDENT_AMBULATORY_CARE_PROVIDER_SITE_OTHER): Payer: Medicaid Other | Admitting: Family Medicine

## 2020-07-30 ENCOUNTER — Encounter: Payer: Self-pay | Admitting: Family Medicine

## 2020-07-30 ENCOUNTER — Other Ambulatory Visit: Payer: Self-pay

## 2020-07-30 VITALS — BP 120/72 | HR 80 | Ht 62.0 in | Wt 175.0 lb

## 2020-07-30 DIAGNOSIS — N898 Other specified noninflammatory disorders of vagina: Secondary | ICD-10-CM

## 2020-07-30 DIAGNOSIS — Z23 Encounter for immunization: Secondary | ICD-10-CM | POA: Diagnosis not present

## 2020-07-30 DIAGNOSIS — G43709 Chronic migraine without aura, not intractable, without status migrainosus: Secondary | ICD-10-CM | POA: Diagnosis not present

## 2020-07-30 DIAGNOSIS — Z113 Encounter for screening for infections with a predominantly sexual mode of transmission: Secondary | ICD-10-CM | POA: Insufficient documentation

## 2020-07-30 LAB — POCT WET PREP (WET MOUNT)
Clue Cells Wet Prep Whiff POC: NEGATIVE
Trichomonas Wet Prep HPF POC: ABSENT

## 2020-07-30 MED ORDER — SUMATRIPTAN SUCCINATE 50 MG PO TABS: 50.0000 mg | ORAL_TABLET | ORAL | 0 refills | Status: DC | PRN

## 2020-07-30 NOTE — Assessment & Plan Note (Addendum)
Wet prep negative GC/CL pending. Will follow up and treat if indicated

## 2020-07-30 NOTE — Progress Notes (Signed)
   Subjective:   Patient ID: [name]    DOB: 09/19/1985, 34 y.o. female   MRN: [id number]  [name] is a 34 y.o. female with a history of chronic migraine, anxiety, GAD, HSV here for vaginal discharge.  Vaginal Discharge: Patient complains of vaginal discharge that is clear and creamy, sometimes yellow.  She notes that it smells like cooked onions.  This is been going on for the past 2 or 3 months.  She is sexually active with one female partner, last intercourse about 3 weeks ago.  She has a tubal ligation.  Denies any recent antibiotics.  Denies any pelvic pain, fevers, chills.  She is interested in STD testing.  Review of Systems:  Per HPI.   Objective:   BP 120/72   Pulse 80   Ht 5\' 2"  (1.575 m)   Wt 175 lb (79.4 kg)   LMP 07/16/2020   SpO2 97%   BMI 32.01 kg/m  Vitals and nursing note reviewed.  General: pleasant young female, sitting comfortably on exam bed, well nourished, well developed, in no acute distress with non-toxic appearance Resp: breathing comfortably on room air, speaking in full sentences MSK: gait normal Neuro: Alert and oriented, speech normal Pelvic exam: VULVA: normal appearing vulva with no masses, tenderness or lesions, Large patch of white dry skin on left labia majora and inner left thigh that is flaky, VAGINA: normal appearing vagina with normal color and discharge, no lesions, CERVIX: normal appearing cervix without discharge or lesions, ectropion extending out about 0.5cm, cervical motion tenderness absent, exam chaperoned by Jazmin.  Assessment & Plan:   Vaginal discharge Wet prep negative GC/CL pending. Will follow up and treat if indicated  Screening examination for STD (sexually transmitted disease) GC/Cl, HIV, RPR, Hep C pending. Will follow up and treat if indicated.  Chronic migraine without aura without status migrainosus, not intractable - notes having to take Sumatriptan daily for migraines - refill provided, however  recommended follow up with PCP to discuss controller medication  Orders Placed This Encounter  Procedures  . Flu Vaccine QUAD 36+ mos IM  . HIV Antibody (routine testing w rflx)  . RPR  . Hepatitis C antibody  . POCT Wet Prep [facility])   Meds ordered this encounter  Medications  . SUMAtriptan (IMITREX) 50 MG tablet    Sig: Take 1 tablet (50 mg total) by mouth every 2 (two) hours as needed for migraine. May repeat in 2 hours if headache persists or recurs.    Dispense:  30 tablet    Refill:  0     Mina Marble, DO PGY-3, [place] Medicine 07/30/2020 12:29 PM

## 2020-07-30 NOTE — Assessment & Plan Note (Signed)
GC/Cl, HIV, RPR, Hep C pending. Will follow up and treat if indicated.

## 2020-07-30 NOTE — Patient Instructions (Signed)
It was a pleasure to see you today!  Thank you for choosing [facility] for your primary care.  [name] was seen for vaginal discharge.   Our plans for today were:  I will call you with results and any recommended treatment  Please follow-up with your to further discuss a controller migraine medicine  Best Wishes,   Mina Marble, DO

## 2020-07-30 NOTE — Assessment & Plan Note (Signed)
-   notes having to take Sumatriptan daily for migraines - refill provided, however recommended follow up with PCP to discuss controller medication

## 2020-07-31 LAB — CERVICOVAGINAL ANCILLARY ONLY
Chlamydia: NEGATIVE
Comment: NEGATIVE
Comment: NEGATIVE
Comment: NORMAL
Neisseria Gonorrhea: NEGATIVE
Trichomonas: NEGATIVE

## 2020-07-31 LAB — HIV ANTIBODY (ROUTINE TESTING W REFLEX): HIV Screen 4th Generation wRfx: NONREACTIVE

## 2020-07-31 LAB — HEPATITIS C ANTIBODY: Hep C Virus Ab: <0.1 s/co ratio (ref 0.0–0.9)

## 2020-07-31 LAB — RPR: RPR Ser Ql: NONREACTIVE

## 2020-08-01 ENCOUNTER — Encounter: Payer: Self-pay | Admitting: Family Medicine

## 2020-08-31 DIAGNOSIS — Z20822 Contact with and (suspected) exposure to covid-19: Secondary | ICD-10-CM | POA: Diagnosis not present

## 2020-10-17 ENCOUNTER — Other Ambulatory Visit: Payer: Self-pay | Admitting: Family Medicine

## 2020-10-19 NOTE — Telephone Encounter (Signed)
Needs appointment

## 2020-10-21 ENCOUNTER — Telehealth: Payer: Self-pay

## 2020-10-21 NOTE — Telephone Encounter (Signed)
Patient calls nurse line requesting to schedule appointment for chest pain. Reports that chest pain woke her up at around 2 am this morning. Reports that pain is in the middle of chest. Denies arm pain, shortness of breath or jaw tightness. Reports pain as sore, throbbing and pressure. Reports that pain has been constant.   Denies sick symptoms or exposure.   Advised patient to be evaluated in ED due to current symptoms. Patient verbalizes understanding.   Talbot Grumbling, RN

## 2020-11-11 NOTE — Progress Notes (Deleted)
    SUBJECTIVE:   CHIEF COMPLAINT / HPI:   Migraines *** Takes imitrex ***  Anxiety Current regimen: Zoloft 100 mg daily, hydroxyzine 25 mg 3 times daily as needed  PERTINENT  PMH / PSH: ***  OBJECTIVE:   There were no vitals taken for this visit.   Physical Exam: *** General: 35 y.o. female in NAD Cardio: RRR no m/r/g Lungs: CTAB, no wheezing, no rhonchi, no crackles, no IWOB on *** Abdomen: Soft, non-tender to palpation, non-distended, positive bowel sounds Skin: warm and dry Extremities: No edema Psych: Mood and affect appropriate for circumstance, appropriate dress, thought process linear and logical, good insight, no SI   ASSESSMENT/PLAN:   No problem-specific Assessment & Plan notes found for this encounter.     Cleophas Dunker, DO [place]   {    This will disappear when note is signed, click to select method of visit    :1}

## 2020-11-12 ENCOUNTER — Ambulatory Visit: Payer: Medicaid Other | Admitting: Family Medicine

## 2020-11-17 NOTE — Progress Notes (Deleted)
    SUBJECTIVE:   CHIEF COMPLAINT / HPI:   Migraines *** Takes imitrex ***  Heavy menstruation ***  Anxiety Current regimen: Zoloft 100 mg daily, hydroxyzine 25 mg 3 times daily as needed ***   GAD 7 : Generalized Anxiety Score 07/22/2019  Nervous, Anxious, on Edge 3  Control/stop worrying 3  Worry too much - different things 3  Trouble relaxing 2  Restless 2  Easily annoyed or irritable 2  Afraid - awful might happen 2  Total GAD 7 Score 17  Anxiety Difficulty Very difficult      PERTINENT  PMH / PSH: ***  OBJECTIVE:   There were no vitals taken for this visit.   Physical Exam: *** General: 35 y.o. female in NAD Cardio: RRR no m/r/g Lungs: CTAB, no wheezing, no rhonchi, no crackles, no IWOB on *** Abdomen: Soft, non-tender to palpation, non-distended, positive bowel sounds Skin: warm and dry Extremities: No edema Psych: Mood and affect appropriate for circumstance, appropriate dress, thought process linear and logical, good insight, no SI   ASSESSMENT/PLAN:   No problem-specific Assessment & Plan notes found for this encounter.     Cleophas Dunker, DO [place]   {    This will disappear when note is signed, click to select method of visit    :1}

## 2020-11-18 ENCOUNTER — Ambulatory Visit: Payer: Medicaid Other | Admitting: Family Medicine
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
## 2021-01-22 ENCOUNTER — Other Ambulatory Visit: Payer: Self-pay

## 2021-01-22 ENCOUNTER — Encounter: Payer: Self-pay | Admitting: Family Medicine

## 2021-01-22 ENCOUNTER — Ambulatory Visit (INDEPENDENT_AMBULATORY_CARE_PROVIDER_SITE_OTHER): Payer: Medicaid Other | Admitting: Family Medicine

## 2021-01-22 VITALS — BP 116/88 | HR 81 | Ht 62.0 in | Wt 188.4 lb

## 2021-01-22 DIAGNOSIS — Z791 Long term (current) use of non-steroidal anti-inflammatories (NSAID): Secondary | ICD-10-CM

## 2021-01-22 DIAGNOSIS — R4589 Other symptoms and signs involving emotional state: Secondary | ICD-10-CM | POA: Insufficient documentation

## 2021-01-22 DIAGNOSIS — N921 Excessive and frequent menstruation with irregular cycle: Secondary | ICD-10-CM | POA: Insufficient documentation

## 2021-01-22 DIAGNOSIS — N920 Excessive and frequent menstruation with regular cycle: Secondary | ICD-10-CM

## 2021-01-22 DIAGNOSIS — G43101 Migraine with aura, not intractable, with status migrainosus: Secondary | ICD-10-CM | POA: Diagnosis not present

## 2021-01-22 MED ORDER — AMITRIPTYLINE HCL 25 MG PO TABS: ORAL_TABLET | ORAL | 1 refills | Status: DC
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
## 2021-01-22 NOTE — Assessment & Plan Note (Addendum)
Noted in discussion on migraines.  Encouraged decrease, we will go ahead and obtain a BMP today.  Discussed analgesia withdrawal headache.  Starting controller per above to decrease use.  Also discussed that doses over 600 mg of ibuprofen have shown no increased benefit.

## 2021-01-22 NOTE — Patient Instructions (Addendum)
Thank you for coming to see me today. It was a pleasure. Today we talked about:   We will get some labs today.  If they are abnormal or we need to do something about them, I will call you.  If they are normal, I will send you a message on MyChart (if it is active) or a letter in the mail.  If you don't hear from [place] in 2 weeks, please call the office at the number below.  Schedule with the Gynecology Clinic here at the office for an IUD insertion.  I have placed a referral to Neurology for your headaches.  If you do not hear from them in the next 2 weeks, please give [place] a call.  We will start you on amitriptyline for your headaches and mood.  Take 25mg  at night for a week, then increase to 50mg  at night for a week, then take 25mg  in the morning and 50mg  at night.  This is slowly increased to make sure you don't get too sleepy.  Please follow-up with new PCP in 1 month.  If you have any questions or concerns, please do not hesitate to call the office at [phone].  Best,   [place] Constable, DO   Therapy and Counseling Resources Most providers on this list will take Medicaid. Patients with commercial insurance or Medicare should contact their insurance company to get a list of in network providers.  BestDay:Psychiatry and Counseling [address] [place]. [place], [place] [address] [place], [place], [place] [address]      [place] [address]  [place], [place] [address] [phone]  [place] [address], [place]  [place], [place] [address]       [phone]     [facility] (virtual only) [phone]  [facility] Bl[facility] [address] [place], [place]  [facility]:  [place]. [place] [phone]  [facility]:  [place] [address] [place] [phone]  [facility], [place] (under & uninsured) [address] [place] [phone]    [email or web address]    [place] [phone] . [place]    [phone]  [facility] [place]     Phone:  [phone]     [place]  [place] [address]      [place], [place] [address] [place]: [place], [place], [place]   [place] ([place] therapist) [email or web address]  [place] [address]   [place] [address]    [phone]    [facility]   [address] [place],  [place], [place]   [place]  [place]  [facility]  [address] [place], [place]        [phone]  Open Access/Walk In Clinic under & uninsured  [facility]  [address] [place], [place] [phone]  [facility] [place],  ([place])   [place], [place]: [phone]) 8:30 - 12; 1 - 2:30  [facility] of the [place],  [place], [place]    (  [phone]):8:30 - 12; 2 - 3PM  [facility] [address]  [address]  [place]; [phone]):   Mon - Fri 8 AM - 5 PM  [facility] [place]  MWF 12:30 to 3:00 or call to schedule an appointment  [phone]  Specific Provider options Psychology Today  [email or web address] 1. click on find a therapist  2. enter your zip code 3. left side and select or tailor a therapist for your specific need.   [facility] Provider Directory [email or web address]  (Medicaid)   Follow all drop down to find a provider  [place] [phone] or [email or web address] [address], [place], [place] Recovery support and educational   24- Hour Availability:  .  [facility]  . [place], [place] Crisis [phone]  . [facility] of the [employer] [phone]  [facility] Crisis Service  [phone]   . [place]  [phone] (after hours)  . Therapeutic Alternative/Mobile Crisis   [phone]  . [place] National Suicide Hotline  [phone] ([place])  . Call 911 or go to emergency room  . [employer]  [phone]);  [place] and [employer]   . [facility]  [phone]); [place], [place], [place], [place], [place], [place], [place]
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
## 2021-01-22 NOTE — Assessment & Plan Note (Signed)
Would avoid estrogen in this patient given her history of migraines with aura.  Did discuss with her possible options including minipill, IUD, ablation.  She is most interested in IUD, counseled on risks and benefits.  She has a BTL, would not need to worry about pregnancy.  She will schedule with gynecology clinic to have this placed.  We will obtain a CBC today given her heavy bleeding.

## 2021-01-23 LAB — CBC WITH DIFFERENTIAL/PLATELET
Basophils Absolute: 0 10*3/uL (ref 0.0–0.2)
Basos: 1 %
EOS (ABSOLUTE): 0.2 10*3/uL (ref 0.0–0.4)
Eos: 3 %
Hematocrit: 35.3 % (ref 34.0–46.6)
Hemoglobin: 11.6 g/dL (ref 11.1–15.9)
Immature Grans (Abs): 0 10*3/uL (ref 0.0–0.1)
Immature Granulocytes: 0 %
Lymphocytes Absolute: 2.1 10*3/uL (ref 0.7–3.1)
Lymphs: 45 %
MCH: 27.6 pg (ref 26.6–33.0)
MCHC: 32.9 g/dL (ref 31.5–35.7)
MCV: 84 fL (ref 79–97)
Monocytes Absolute: 0.5 10*3/uL (ref 0.1–0.9)
Monocytes: 10 %
Neutrophils Absolute: 2 10*3/uL (ref 1.4–7.0)
Neutrophils: 41 %
Platelets: 307 10*3/uL (ref 150–450)
RBC: 4.2 x10E6/uL (ref 3.77–5.28)
RDW: 13.3 % (ref 11.7–15.4)
WBC: 4.7 10*3/uL (ref 3.4–10.8)

## 2021-01-23 LAB — BASIC METABOLIC PANEL
BUN/Creatinine Ratio: 18 (ref 9–23)
BUN: 11 mg/dL (ref 6–20)
CO2: 20 mmol/L (ref 20–29)
Calcium: 8.8 mg/dL (ref 8.7–10.2)
Chloride: 102 mmol/L (ref 96–106)
Creatinine, Ser: 0.61 mg/dL (ref 0.57–1.00)
Glucose: 83 mg/dL (ref 65–99)
Potassium: 4.2 mmol/L (ref 3.5–5.2)
Sodium: 136 mmol/L (ref 134–144)
eGFR: 121 mL/min/{1.73_m2} (ref >59)

## 2021-01-26 ENCOUNTER — Encounter: Payer: Self-pay | Admitting: Neurology

## 2021-02-05 NOTE — Progress Notes (Deleted)
    SUBJECTIVE:   CHIEF COMPLAINT / HPI:   IUD insertion Patient presents today for IUD insertion for menorrhagia, previously discussed on 6/3 S/p BTL, no concern for pregnancy  PERTINENT  PMH / PSH:   OBJECTIVE:   There were no vitals taken for this visit.  ***    IUD Insertion Procedure Note Patient identified, informed consent performed.  Discussed risks of irregular bleeding, cramping, infection, malpositioning or misplacement of the IUD outside the uterus which may require further procedure such as laparoscopy. Time out was performed.  Urine pregnancy test negative.  Speculum placed in the vagina.  Cervix visualized.  Cleaned with Betadine x 2.  Grasped anteriorly with a single tooth tenaculum.  Uterus sounded to *** cm.  Mirena IUD placed per manufacturer's recommendations.  Strings trimmed to 3 cm. Tenaculum was removed, good hemostasis noted.  Patient tolerated procedure well.   Patient was given post-procedure instructions.  She was advised to be have backup contraception for one week.  Patient was also asked to check IUD strings periodically and follow up in 4 weeks for IUD check.   ASSESSMENT/PLAN:   No problem-specific Assessment & Plan notes found for this encounter.     Julia Dunker, DO [place]   {    This will disappear when note is signed, click to select method of visit    :1}

## 2021-02-08 ENCOUNTER — Ambulatory Visit: Payer: Medicaid Other | Admitting: Family Medicine

## 2021-02-11 NOTE — Progress Notes (Signed)
    SUBJECTIVE:   CHIEF COMPLAINT / HPI:   IUD insertion She was previously seen on 6/3 and counseled on treatment options for menorrhagia She has a history of a BTL in 2018 and has continued to have heavy periods since then Last Pap 03/12/2019, negative with negative HPV, due in 2023 Denies concerns for STDs, but does report vaginal discharge for the last week or so  PERTINENT  PMH / PSH: History of migraines, history of HSV, anxiety, depressed mood, menorrhagia  OBJECTIVE:   BP 110/74   Ht 5\' 2"  (1.575 m)   Wt 191 lb (86.6 kg)   LMP 01/21/2021   BMI 34.93 kg/m    Physical Exam: General: In NAD Respiratory: Breathing comfortably on room air GU: Pelvic exam performed with patient supine.  Chaperone in room.  Bilateral labia without abnormalities.  Cervix exhibits thick yellow discharge, ectropion.  No vaginal lesions.  Vaginal discharge yellow/white.  Results for orders placed or performed in visit on 02/12/21 (from the past 24 hour(s))  POCT Wet Prep [place])     Status: Abnormal   Collection Time: 02/12/21 10:40 AM  Result Value Ref Range   Source Wet Prep POC VAG    WBC, Wet Prep HPF POC 1-5    Bacteria Wet Prep HPF POC Moderate (A) Few   Clue Cells Wet Prep HPF POC Moderate (A) None   Clue Cells Wet Prep Whiff POC Positive Whiff    Yeast Wet Prep HPF POC None None   KOH Wet Prep POC None None   Trichomonas Wet Prep HPF POC Absent Absent  POCT urine pregnancy     Status: None   Collection Time: 02/12/21 11:46 AM  Result Value Ref Range   Preg Test, Ur Negative Negative       ASSESSMENT/PLAN:   Vaginal discharge Vaginal discharge on exam concerning for infection, therefore will not place IUD today.  Will obtain G/C.  Wet prep c/w bacterial vaginosis.  Rescheduled for next week for IUD insertion unless G/C returns positive.  Bacterial vaginosis Wet prep consistent with bacterial vaginosis.  We will treat with Flagyl twice daily for 7 days.  Per CDC  guidelines, no specific reason to not insert an IUD while patient is being treated for bacterial vaginosis.  As long as her STD testing comes back within normal limits, can proceed with IUD on 6/28.     [place] Dunker, [place]

## 2021-02-12 ENCOUNTER — Other Ambulatory Visit: Payer: Self-pay

## 2021-02-12 ENCOUNTER — Encounter: Payer: Self-pay | Admitting: Family Medicine

## 2021-02-12 ENCOUNTER — Ambulatory Visit (INDEPENDENT_AMBULATORY_CARE_PROVIDER_SITE_OTHER): Payer: Medicaid Other | Admitting: Family Medicine

## 2021-02-12 ENCOUNTER — Other Ambulatory Visit (HOSPITAL_COMMUNITY)
Admission: RE | Admit: 2021-02-12 | Discharge: 2021-02-12 | Disposition: A | Discharge status: Home / Self Care | Payer: Medicaid Other | Source: Ambulatory Visit | Attending: Family Medicine | Admitting: Family Medicine

## 2021-02-12 VITALS — BP 110/74 | Ht 62.0 in | Wt 191.0 lb

## 2021-02-12 DIAGNOSIS — N898 Other specified noninflammatory disorders of vagina: Secondary | ICD-10-CM | POA: Insufficient documentation

## 2021-02-12 DIAGNOSIS — B9689 Other specified bacterial agents as the cause of diseases classified elsewhere: Secondary | ICD-10-CM | POA: Diagnosis not present

## 2021-02-12 DIAGNOSIS — N760 Acute vaginitis: Secondary | ICD-10-CM

## 2021-02-12 DIAGNOSIS — Z975 Presence of (intrauterine) contraceptive device: Secondary | ICD-10-CM

## 2021-02-12 LAB — POCT WET PREP (WET MOUNT)
Clue Cells Wet Prep Whiff POC: POSITIVE
Trichomonas Wet Prep HPF POC: ABSENT

## 2021-02-12 LAB — POCT URINE PREGNANCY: Preg Test, Ur: NEGATIVE

## 2021-02-12 MED ORDER — METRONIDAZOLE 500 MG PO TABS: 500.0000 mg | ORAL_TABLET | Freq: Two times a day (BID) | ORAL | 0 refills | Status: AC

## 2021-02-12 NOTE — Assessment & Plan Note (Signed)
Wet prep consistent with bacterial vaginosis.  We will treat with Flagyl twice daily for 7 days.  Per CDC guidelines, no specific reason to not insert an IUD while patient is being treated for bacterial vaginosis.  As long as her STD testing comes back within normal limits, can proceed with IUD on 6/28.

## 2021-02-12 NOTE — Patient Instructions (Addendum)
Thank you for coming to see me today. It was a pleasure. Today we talked about:   We performed STD testing today. This will take a few days to come back. If your MyChart is activated, we will message you on there if everything is normal, otherwise we will call. If we need to treat something we will also call you. If you do not hear from [place] in the next 4 days, please give [place] a call.   You do have bacterial vaginosis which we will treat with Flagyl twice daily for the next 7 days.  We can still place her IUD if this is the only thing that is found on your testing.  Please follow-up with me on Tuesday unless you have an infection.  If you have any questions or concerns, please do not hesitate to call the office at [phone].  Best,   [place] Constable, DO

## 2021-02-12 NOTE — Assessment & Plan Note (Signed)
Vaginal discharge on exam concerning for infection, therefore will not place IUD today.  Will obtain G/C.  Wet prep c/w bacterial vaginosis.  Rescheduled for next week for IUD insertion unless G/C returns positive.

## 2021-02-15 LAB — CERVICOVAGINAL ANCILLARY ONLY
Chlamydia: NEGATIVE
Comment: NEGATIVE
Comment: NORMAL
Neisseria Gonorrhea: NEGATIVE

## 2021-02-16 ENCOUNTER — Other Ambulatory Visit: Payer: Self-pay

## 2021-02-16 ENCOUNTER — Encounter: Payer: Self-pay | Admitting: Family Medicine

## 2021-02-16 ENCOUNTER — Ambulatory Visit (INDEPENDENT_AMBULATORY_CARE_PROVIDER_SITE_OTHER): Payer: Medicaid Other | Admitting: Family Medicine

## 2021-02-16 VITALS — BP 122/88 | HR 92 | Ht 62.0 in | Wt 190.0 lb

## 2021-02-16 DIAGNOSIS — Z30430 Encounter for insertion of intrauterine contraceptive device: Secondary | ICD-10-CM | POA: Diagnosis not present

## 2021-02-16 DIAGNOSIS — N920 Excessive and frequent menstruation with regular cycle: Secondary | ICD-10-CM

## 2021-02-16 DIAGNOSIS — Z30019 Encounter for initial prescription of contraceptives, unspecified: Secondary | ICD-10-CM | POA: Diagnosis not present

## 2021-02-16 LAB — POCT URINE PREGNANCY: Preg Test, Ur: NEGATIVE

## 2021-02-16 NOTE — Patient Instructions (Signed)

## 2021-02-16 NOTE — Progress Notes (Signed)
    SUBJECTIVE:   CHIEF COMPLAINT / HPI:   IUD insertion Has a prior BTL 2018 Wants IUD for menorrhagia Seen on 6/3 for counseling on options and opted for Mirena STD testing negative on 6/24 when presented for IUD insertion but had discharge Currently on Flagyl for bacterial vaginosis Last Pap 03/12/2019, negative with negative HPV, due in 2023  PERTINENT  PMH / PSH: Migraine with aura, GAD, Depressed mood  OBJECTIVE:   BP 122/88   Pulse 92   Ht 5\' 2"  (1.575 m)   Wt 190 lb (86.2 kg)   LMP 01/21/2021   SpO2 98%   BMI 34.75 kg/m    Physical Exam: General: In NAD Respiratory: Breathing comfortably on room air GU: Pelvic exam performed with patient supine.  Chaperone in room.  Bilateral labia without abnormalities.  Cervix exhibits no discharge, cervical ectropion.  No vaginal lesions.  Vaginal discharge thin, white.   IUD Insertion Procedure Note Patient identified, informed consent performed.  Discussed risks of irregular bleeding, cramping, infection, malpositioning or misplacement of the IUD outside the uterus which may require further procedure such as laparoscopy. Time out was performed.  Urine pregnancy test negative.  Speculum placed in the vagina.  Cervix visualized.  Cleaned with Betadine x 2.  Grasped anteriorly with a single tooth tenaculum.  Uterus sounded to 10 cm.  Mirena IUD placed per manufacturer's recommendations.  Strings trimmed to 3 cm. Tenaculum was removed, good hemostasis noted.  Patient tolerated procedure well.   Patient was given post-procedure instructions.  She was advised to be have backup contraception for one week.  Patient was also asked to check IUD strings periodically and follow up in 4 weeks for IUD check.   Results for orders placed or performed in visit on 02/16/21 (from the past 24 hour(s))  POCT urine pregnancy     Status: None   Collection Time: 02/16/21 10:10 AM  Result Value Ref Range   Preg Test, Ur Negative Negative     ASSESSMENT/PLAN:   Encounter for insertion of mirena IUD Low risk to proceed with placement with bacterial vaginosis, therefore will perform today.  IUD placed per above.  Patient tolerated procedure well.  Placed for menorrhagia, has BTL for contraception.  Return in 4 weeks for IUD string check.  Advised of return precautions for signs of infection.  Menorrhagia with regular cycle IUD placed for menorrhagia     Cleophas Dunker, [place]

## 2021-02-16 NOTE — Assessment & Plan Note (Addendum)
Low risk to proceed with placement with bacterial vaginosis, therefore will perform today.  IUD placed per above.  Patient tolerated procedure well.  Placed for menorrhagia, has BTL for contraception.  Return in 4 weeks for IUD string check.  Advised of return precautions for signs of infection.

## 2021-02-16 NOTE — Assessment & Plan Note (Signed)
IUD placed for menorrhagia

## 2021-03-08 MED ORDER — LEVONORGESTREL 20 MCG/DAY IU IUD
1.0000 | INTRAUTERINE_SYSTEM | Freq: Once | INTRAUTERINE | Status: AC
  Administered 2021-02-16: 1 via INTRAUTERINE

## 2021-03-08 NOTE — Addendum Note (Signed)
Addended by: Talbot Grumbling on: 03/08/2021 04:30 PM   Modules accepted: Orders

## 2021-03-18 ENCOUNTER — Ambulatory Visit: Payer: Medicaid Other | Admitting: Family Medicine

## 2021-03-26 IMAGING — CR CHEST - 2 VIEW
2 series · 2 of 2 positions shown · non-contrast
Comparison: None.

CLINICAL DATA: Shortness of breath, chest pain

EXAM:
CHEST - 2 VIEW

[w chest lat]
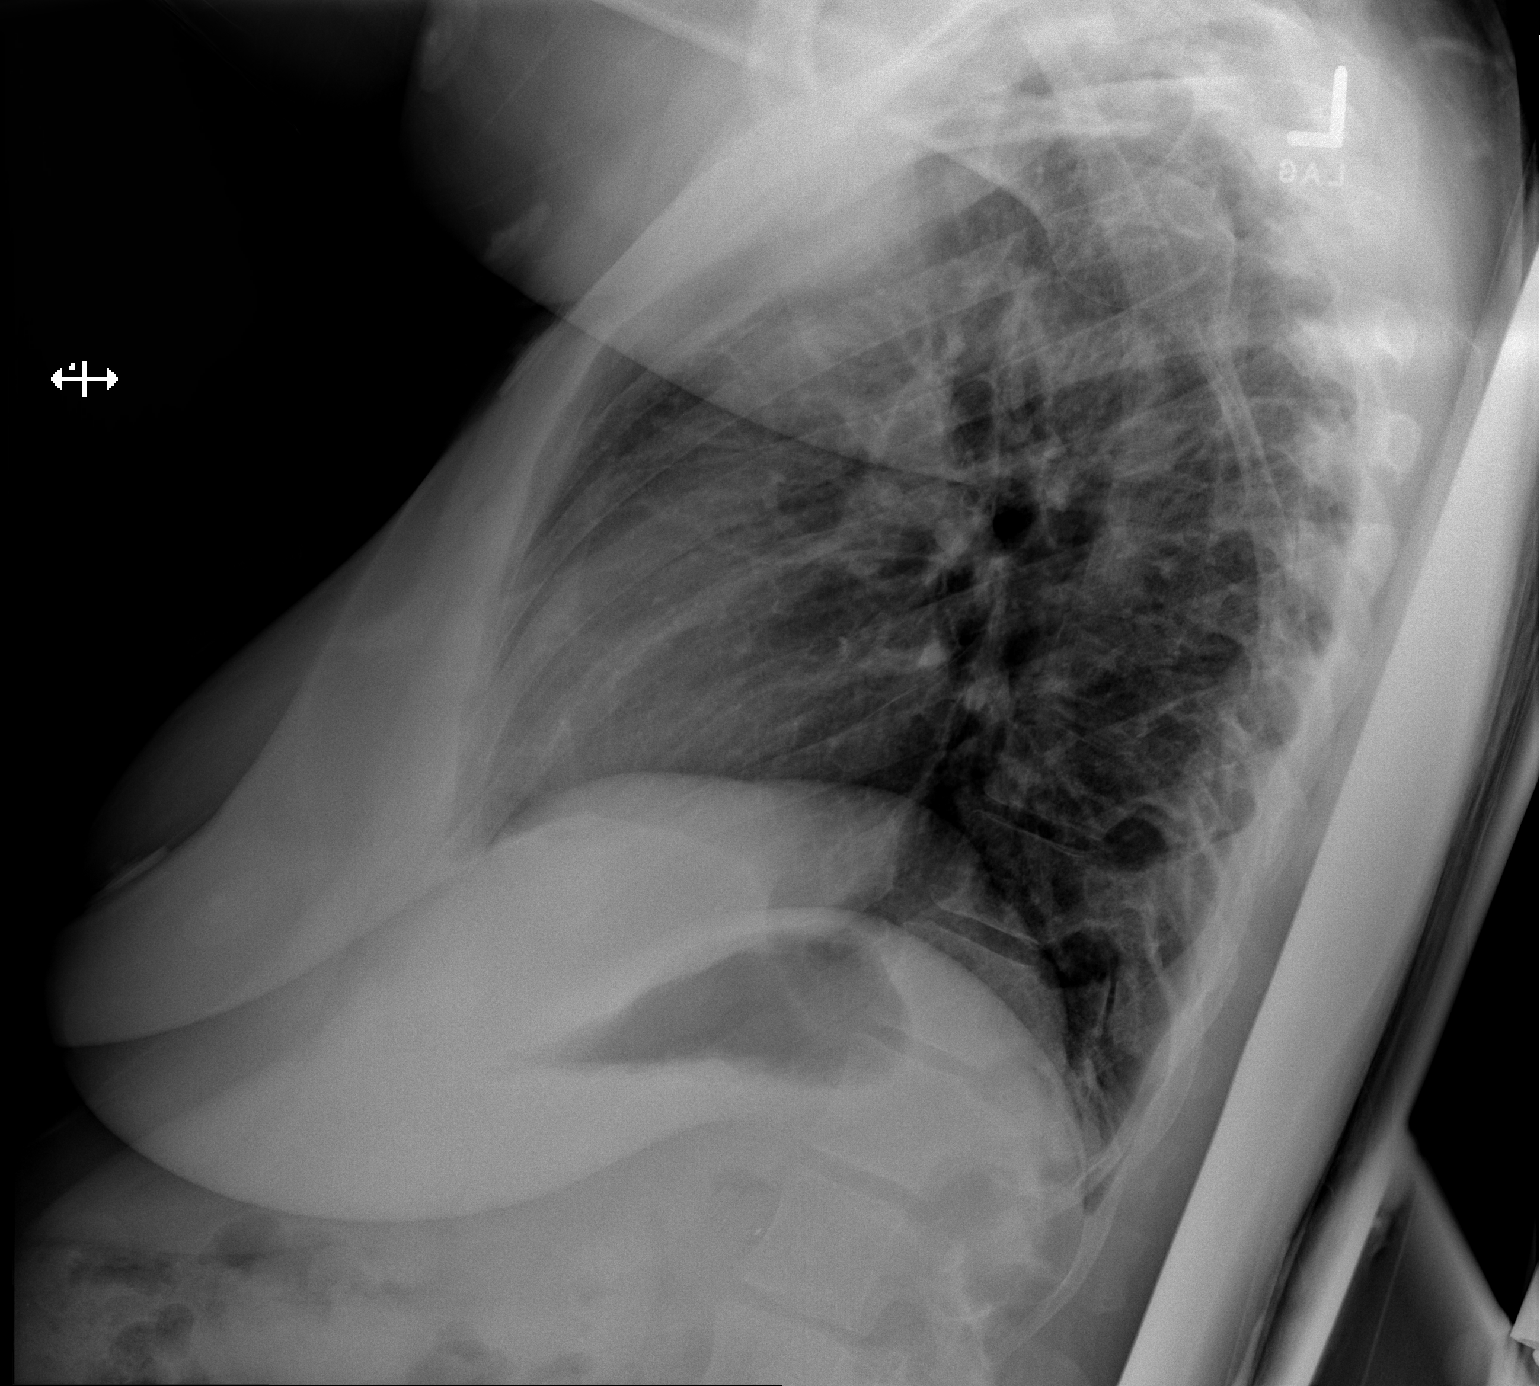

[x chest ap]
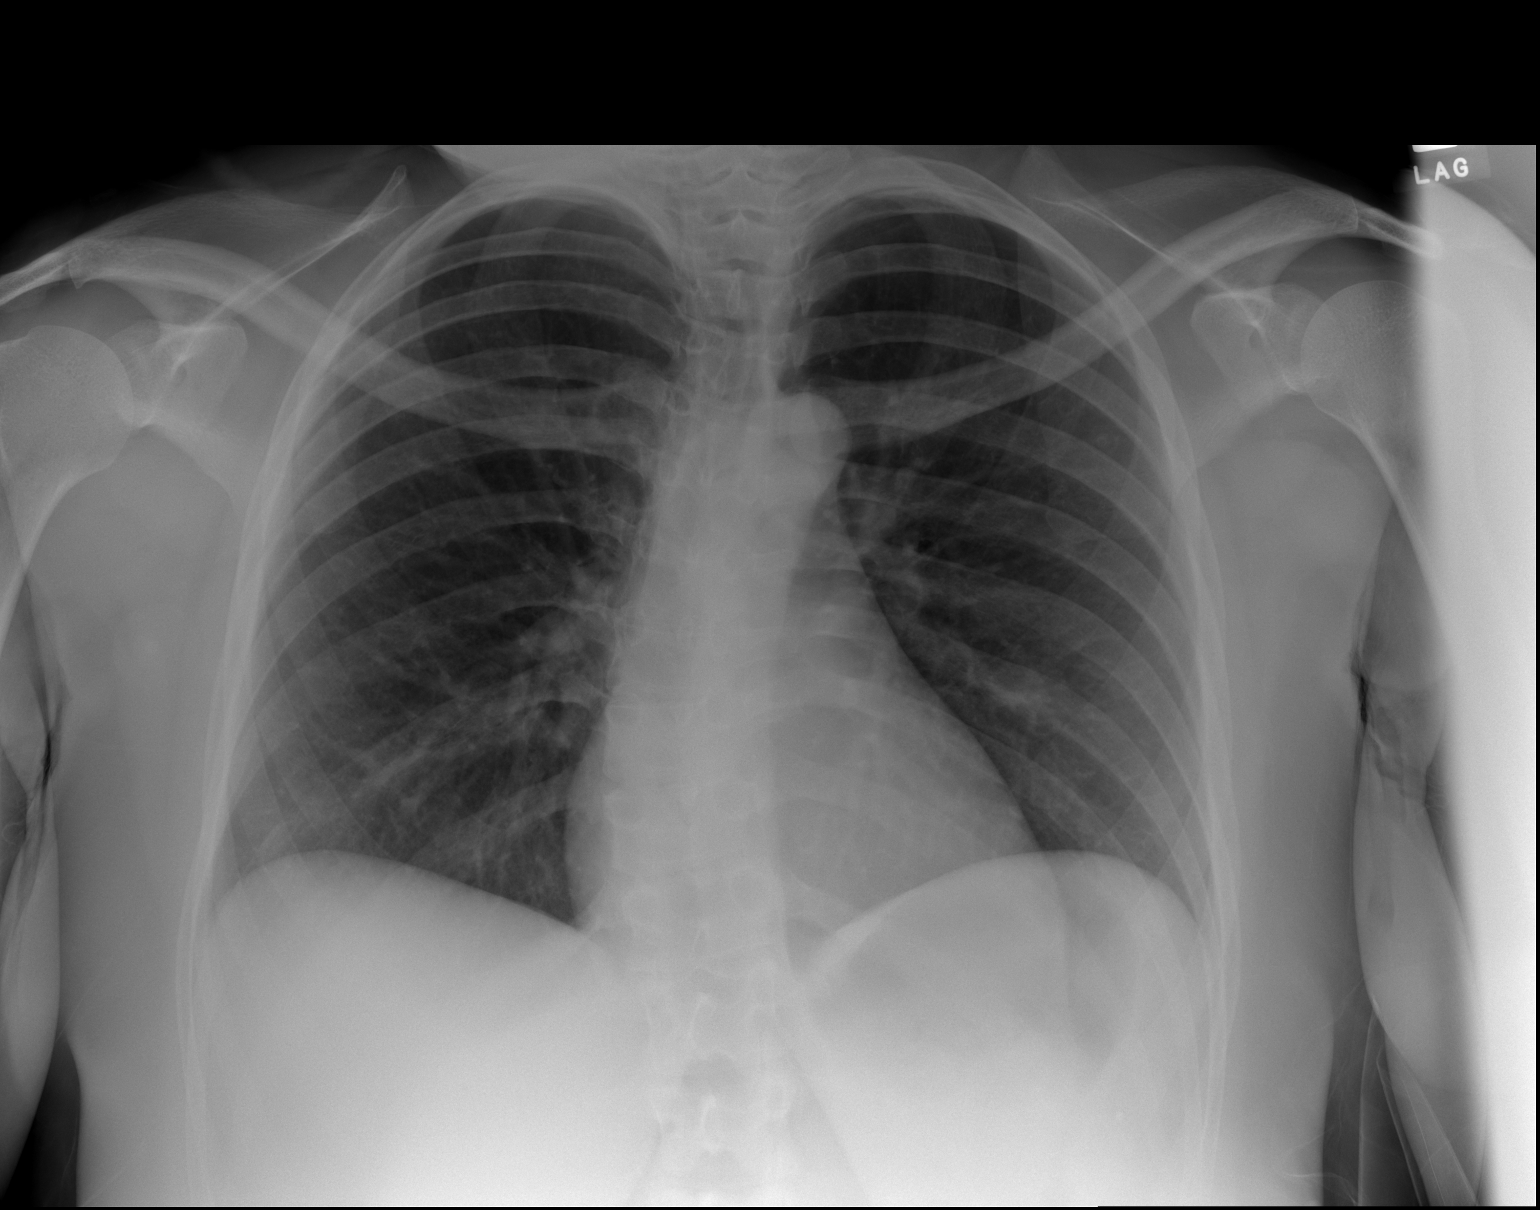

[2 of 2 positions shown; findings below may reference images not displayed]

FINDINGS: Heart and mediastinal contours are within normal limits. No focal
opacities or effusions. No acute bony abnormality.
IMPRESSION: No active cardiopulmonary disease.
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
## 2021-04-16 ENCOUNTER — Other Ambulatory Visit: Payer: Self-pay

## 2021-04-16 ENCOUNTER — Encounter: Payer: Self-pay | Admitting: Neurology

## 2021-04-16 ENCOUNTER — Ambulatory Visit: Payer: Medicaid Other | Admitting: Neurology

## 2021-04-16 VITALS — BP 148/98 | HR 71 | Resp 20 | Ht 62.0 in | Wt 192.0 lb

## 2021-04-16 DIAGNOSIS — G43709 Chronic migraine without aura, not intractable, without status migrainosus: Secondary | ICD-10-CM | POA: Diagnosis not present

## 2021-04-16 MED ORDER — PROPRANOLOL HCL 40 MG PO TABS: ORAL_TABLET | ORAL | 0 refills | Status: DC

## 2021-04-16 NOTE — Patient Instructions (Signed)
  Start propranolol - take 1/2 tablet twice daily for one week, then increase to 1 tablet twice daily.  Contact us in 5 weeks with update and we can increase dose if needed. Take one tablet amitriptyline at bedtime for one week, then stop Stop ibuprofen and sumatriptan Take Nurtec at earliest onset of headache.  Maximum 1 tablet in 24 hours. Limit use of pain relievers to no more than 2 days out of the week.  These medications include acetaminophen, NSAIDs (ibuprofen/Advil/Motrin, naproxen/Aleve, triptans (Imitrex/sumatriptan), Excedrin, and narcotics.  This will help reduce risk of rebound headaches. Be aware of common food triggers:  - Caffeine:  coffee, black tea, cola, Mt. Dew  - Chocolate  - Dairy:  aged cheeses (brie, blue, cheddar, gouda, [place], provolone, [place], Swiss, etc), chocolate milk, buttermilk, sour cream, limit eggs and yogurt  - Nuts, peanut butter  - Alcohol  - Cereals/grains:  FRESH breads (fresh bagels, sourdough, doughnuts), yeast productions  - Processed/canned/aged/cured meats (pre-packaged deli meats, hotdogs)  - MSG/glutamate:  soy sauce, flavor enhancer, pickled/preserved/marinated foods  - Sweeteners:  aspartame (Equal, Nutrasweet).  Sugar and Splenda are okay  - Vegetables:  legumes (lima beans, lentils, snow peas, fava beans, pinto peans, peas, garbanzo beans), sauerkraut, onions, olives, pickles  - Fruit:  avocados, bananas, citrus fruit (orange, lemon, grapefruit), mango  - Other:  Frozen meals, macaroni and cheese Routine exercise Stay adequately hydrated (aim for 64 oz water daily) Keep headache diary Maintain proper stress management Maintain proper sleep hygiene Do not skip meals Consider supplements:  magnesium citrate '400mg'$  daily, riboflavin '400mg'$  daily, coenzyme Q10 '100mg'$  three times daily.

## 2021-07-07 ENCOUNTER — Ambulatory Visit: Payer: Medicaid Other | Admitting: Student

## 2021-07-21 ENCOUNTER — Other Ambulatory Visit (HOSPITAL_COMMUNITY)
Admission: RE | Admit: 2021-07-21 | Discharge: 2021-07-21 | Disposition: A | Discharge status: Home / Self Care | Payer: Medicaid Other | Source: Ambulatory Visit | Attending: Family Medicine | Admitting: Family Medicine

## 2021-07-21 ENCOUNTER — Ambulatory Visit (INDEPENDENT_AMBULATORY_CARE_PROVIDER_SITE_OTHER): Payer: Medicaid Other | Admitting: Family Medicine

## 2021-07-21 ENCOUNTER — Other Ambulatory Visit: Payer: Self-pay

## 2021-07-21 VITALS — BP 138/90 | HR 93 | Wt 193.8 lb

## 2021-07-21 DIAGNOSIS — Z23 Encounter for immunization: Secondary | ICD-10-CM

## 2021-07-21 DIAGNOSIS — T8389XA Other specified complication of genitourinary prosthetic devices, implants and grafts, initial encounter: Secondary | ICD-10-CM

## 2021-07-21 DIAGNOSIS — N920 Excessive and frequent menstruation with regular cycle: Secondary | ICD-10-CM | POA: Diagnosis not present

## 2021-07-21 NOTE — Patient Instructions (Signed)
You for coming in today.  You were seen for heavy menstrual bleeding due to IUD.  This is very common in the first 3 to 6 months.  However today we we will obtain labs and imaging and I will notify you of results when available.  We will call you when we schedule your ultrasound.  Dr. Janus Molder

## 2021-07-21 NOTE — Progress Notes (Signed)
    SUBJECTIVE:   CHIEF COMPLAINT / HPI:   Ms. Screws is a 35 yo F who presents for follow up after IUD placement 6/28. She continues to have heavy menstrual bleeding. She finds herself changing out her pad and tampon every few hours. She has a history of heavy menstrual bleeding. She was told this could be common in the first 3 months. There is no improvement since the placement and she endorses daily vaginal bleeding. Endorsing some fatigues and light headedness.   PERTINENT  PMH / PSH: hx of HSV  OBJECTIVE:   BP 138/90   Pulse 93   Wt 193 lb 12.8 oz (87.9 kg)   SpO2 100%   BMI 35.45 kg/m   General: Appears well, no acute distress. Age appropriate. Respiratory: normal effort Pelvic exam: normal external genitalia, vulva, vagina, cervix, uterus and adnexa. Minimal blood in vaginal vault Neuro: alert and oriented Psych: normal affect  ASSESSMENT/PLAN:   1. Menorrhagia due to intrauterine device (IUD) ([place]) Minimal blood seen in vaginal vault at this time. Reiterated this can occur up to 6 months after IUD placement. Plan to screen for infection, malpositioning, and anemia.  - US Transvaginal Non-OB; Future - CBC with Differential - Cervicovaginal ancillary only  2. Need for immunization against influenza - Flu Vaccine QUAD 54mo+IM (Fluarix, Fluzone & Alfiuria Quad PF)   Gerlene Fee, [place]

## 2021-07-22 LAB — CBC WITH DIFFERENTIAL/PLATELET
Basophils Absolute: 0.1 10*3/uL (ref 0.0–0.2)
Basos: 1 %
EOS (ABSOLUTE): 0.1 10*3/uL (ref 0.0–0.4)
Eos: 3 %
Hematocrit: 38.8 % (ref 34.0–46.6)
Hemoglobin: 12.9 g/dL (ref 11.1–15.9)
Immature Grans (Abs): 0 10*3/uL (ref 0.0–0.1)
Immature Granulocytes: 0 %
Lymphocytes Absolute: 2.2 10*3/uL (ref 0.7–3.1)
Lymphs: 49 %
MCH: 26.8 pg (ref 26.6–33.0)
MCHC: 33.2 g/dL (ref 31.5–35.7)
MCV: 81 fL (ref 79–97)
Monocytes Absolute: 0.5 10*3/uL (ref 0.1–0.9)
Monocytes: 12 %
Neutrophils Absolute: 1.6 10*3/uL (ref 1.4–7.0)
Neutrophils: 35 %
Platelets: 324 10*3/uL (ref 150–450)
RBC: 4.81 x10E6/uL (ref 3.77–5.28)
RDW: 14.3 % (ref 11.7–15.4)
WBC: 4.5 10*3/uL (ref 3.4–10.8)

## 2021-07-22 LAB — CERVICOVAGINAL ANCILLARY ONLY
Bacterial Vaginitis (gardnerella): POSITIVE — AB
Candida Glabrata: NEGATIVE
Candida Vaginitis: NEGATIVE
Chlamydia: NEGATIVE
Comment: NEGATIVE
Comment: NEGATIVE
Comment: NEGATIVE
Comment: NEGATIVE
Comment: NEGATIVE
Comment: NORMAL
Neisseria Gonorrhea: NEGATIVE
Trichomonas: NEGATIVE

## 2021-07-26 ENCOUNTER — Other Ambulatory Visit: Payer: Self-pay | Admitting: Family Medicine

## 2021-07-26 DIAGNOSIS — N760 Acute vaginitis: Secondary | ICD-10-CM

## 2021-07-26 DIAGNOSIS — B9689 Other specified bacterial agents as the cause of diseases classified elsewhere: Secondary | ICD-10-CM

## 2021-07-26 MED ORDER — METRONIDAZOLE 500 MG PO TABS: 500.0000 mg | ORAL_TABLET | Freq: Two times a day (BID) | ORAL | 0 refills | Status: AC

## 2021-08-22 DIAGNOSIS — N938 Other specified abnormal uterine and vaginal bleeding: Secondary | ICD-10-CM

## 2021-08-22 HISTORY — DX: Other specified abnormal uterine and vaginal bleeding: N93.8

## 2021-09-22 ENCOUNTER — Ambulatory Visit (INDEPENDENT_AMBULATORY_CARE_PROVIDER_SITE_OTHER): Payer: Medicaid Other | Admitting: Family Medicine

## 2021-09-22 ENCOUNTER — Other Ambulatory Visit: Payer: Self-pay

## 2021-09-22 ENCOUNTER — Encounter: Payer: Self-pay | Admitting: Family Medicine

## 2021-09-22 VITALS — BP 124/89 | HR 82 | Wt 196.2 lb

## 2021-09-22 DIAGNOSIS — S39012A Strain of muscle, fascia and tendon of lower back, initial encounter: Secondary | ICD-10-CM

## 2021-09-22 DIAGNOSIS — R5383 Other fatigue: Secondary | ICD-10-CM | POA: Diagnosis not present

## 2021-09-22 NOTE — Patient Instructions (Signed)
It was great to see you!  Things we discussed at today's visit: - Your back pain is from a strained/pulled muscle. You can take Ibuprofen (which is the same as Advil or Motrin) 600mg  every 6-8 hours for the next few days. This will help with inflammation and pain. - Apply ice for 15 minutes 3x/day - Starting Saturday, you can switch to a heating pad for 15 minutes 3x/day - Avoid heavy lifting or sudden movements where you could re-injure the area - It should start to feel better within the next 1 week but it may take several weeks to resolve completely  For your fatigue, consider making an entirely separate visit where we can discuss your mood in more detail and possibly check blood work (such as your thyroid level).  Take care,  Dr. [facility]
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
## 2021-09-23 DIAGNOSIS — R5383 Other fatigue: Secondary | ICD-10-CM | POA: Insufficient documentation

## 2021-09-23 DIAGNOSIS — S39012A Strain of muscle, fascia and tendon of lower back, initial encounter: Secondary | ICD-10-CM | POA: Insufficient documentation
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
## 2021-09-23 NOTE — Assessment & Plan Note (Signed)
Patient with 1 day of right lower back pain after lifting something heavy at work. Presentation consistent with lumbar strain. No indication for imaging at this time. Advised supportive care with NSAIDs and ice initially followed by heat after the acute period. Given work note to avoid lifting >10lbs for the next 1 week to allow proper healing. Return precautions reviewed.

## 2021-10-14 ENCOUNTER — Other Ambulatory Visit: Payer: Self-pay

## 2021-10-14 ENCOUNTER — Ambulatory Visit: Payer: Medicaid Other

## 2021-10-14 ENCOUNTER — Ambulatory Visit (INDEPENDENT_AMBULATORY_CARE_PROVIDER_SITE_OTHER): Payer: Medicaid Other | Admitting: Family Medicine

## 2021-10-14 VITALS — BP 120/81 | HR 81 | Temp 97.9°F | Ht 62.0 in | Wt 190.4 lb

## 2021-10-14 DIAGNOSIS — A084 Viral intestinal infection, unspecified: Secondary | ICD-10-CM | POA: Diagnosis not present

## 2021-10-14 DIAGNOSIS — Z20822 Contact with and (suspected) exposure to covid-19: Secondary | ICD-10-CM | POA: Diagnosis not present

## 2021-10-14 DIAGNOSIS — R110 Nausea: Secondary | ICD-10-CM

## 2021-10-14 LAB — POCT INFLUENZA A/B
Influenza A, POC: NEGATIVE
Influenza B, POC: NEGATIVE

## 2021-10-14 MED ORDER — ONDANSETRON HCL 4 MG PO TABS: 4.0000 mg | ORAL_TABLET | Freq: Three times a day (TID) | ORAL | 0 refills | Status: DC | PRN

## 2021-10-14 MED ORDER — PROPRANOLOL HCL 40 MG PO TABS: ORAL_TABLET | ORAL | 0 refills | Status: DC

## 2021-10-14 NOTE — Patient Instructions (Addendum)
It was wonderful to see you today.  Please bring ALL of your medications with you to every visit.   Today we talked about:  Viral illness.  Continue fluids.  I will prescribe Zofran to be taken as needed for nausea.  You can reattempt Pepto-Bismol with this but will advise this is likely to eventually get better on its own.  Avoid tomatoes.   Follow up with me in 2-4 weeks about heavy vaginal bleeding.   Please be sure to schedule follow up at the front  desk before you leave today.   If you haven't already, sign up for My Chart to have easy access to your labs results, and communication with your primary care physician.  Please call the clinic at [phone] if your symptoms worsen or you have any concerns. It was our pleasure to serve you.  Dr. Janus Molder

## 2021-10-14 NOTE — Progress Notes (Signed)
° ° °  SUBJECTIVE:   CHIEF COMPLAINT / HPI:   Headache, fever 2 days of headache, fever (Tmax 100.1), diarrhea, nausea, and weakness. Known sick contact is her daughter with similar symptoms and a fever. She is in daycare. Denies vomiting, sore throat, shortness of breath, and abdominal pain. Able to keep fluids down but has attempted pepto-bismol which she was not able to tolerated due to nausea.   PERTINENT  PMH / PSH: Hx of migraines (needs refill on propranolol out for 2 days)  OBJECTIVE:   BP 120/81    Pulse 81    Temp 97.9 F (36.6 C)    Ht 5\' 2"  (1.575 m)    Wt 190 lb 6.4 oz (86.4 kg)    LMP 10/14/2021 Comment: Has IUD placed   SpO2 98%    BMI 34.82 kg/m   Physical Exam Vitals reviewed.  Constitutional:      General: She is not in acute distress.    Appearance: She is not ill-appearing, toxic-appearing or diaphoretic.  Eyes:     Conjunctiva/sclera: Conjunctivae normal.  Cardiovascular:     Rate and Rhythm: Normal rate and regular rhythm.     Heart sounds: Normal heart sounds.  Pulmonary:     Effort: Pulmonary effort is normal.     Breath sounds: Normal breath sounds.  Abdominal:     General: There is no distension.     Palpations: Abdomen is soft.     Tenderness: There is no abdominal tenderness. There is no guarding or rebound.  Lymphadenopathy:     Cervical: No cervical adenopathy.  Neurological:     Mental Status: She is alert and oriented to person, place, and time.  Psychiatric:        Mood and Affect: Mood normal.        Behavior: Behavior normal.     ASSESSMENT/PLAN:   1. Viral gastroenteritis 2 days of fever, nausea, diarrhea. Known sick contact is her child with similar symptoms and in daycare. VSS. Afebrile. Unremarkable exam, appears hydrated. Discussed supportive care and good hand washing.   2. Nausea Will prescribe zofran to make OTC medications and fluids tolerable during acute phase of illness.  - Influenza A/B - ondansetron (ZOFRAN) 4 MG tablet;  Take 1 tablet (4 mg total) by mouth every 8 (eight) hours as needed for nausea or vomiting.  Dispense: 20 tablet; Refill: 0  3. Suspected 2019 novel coronavirus infection - Novel Coronavirus, NAA ([facility])  Gerlene Fee, DO [place]

## 2021-10-15 LAB — NOVEL CORONAVIRUS, NAA: SARS-CoV-2, NAA: NOT DETECTED
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
## 2021-10-28 ENCOUNTER — Other Ambulatory Visit: Payer: Self-pay

## 2021-10-28 ENCOUNTER — Encounter: Payer: Self-pay | Admitting: Neurology

## 2021-10-28 ENCOUNTER — Telehealth (INDEPENDENT_AMBULATORY_CARE_PROVIDER_SITE_OTHER): Payer: Medicaid Other | Admitting: Neurology

## 2021-10-28 VITALS — Ht 62.0 in | Wt 180.0 lb

## 2021-10-28 DIAGNOSIS — G43009 Migraine without aura, not intractable, without status migrainosus: Secondary | ICD-10-CM

## 2021-10-28 MED ORDER — RIZATRIPTAN BENZOATE 10 MG PO TABS: 10.0000 mg | ORAL_TABLET | ORAL | 5 refills | Status: DC | PRN

## 2021-10-28 MED ORDER — TOPIRAMATE 25 MG PO TABS: ORAL_TABLET | ORAL | 0 refills | Status: DC

## 2021-11-10 ENCOUNTER — Encounter: Payer: Self-pay | Admitting: Family Medicine

## 2021-11-10 ENCOUNTER — Ambulatory Visit (INDEPENDENT_AMBULATORY_CARE_PROVIDER_SITE_OTHER): Payer: Medicaid Other | Admitting: Family Medicine

## 2021-11-10 ENCOUNTER — Other Ambulatory Visit: Payer: Self-pay

## 2021-11-10 VITALS — BP 130/89 | HR 86 | Wt 188.8 lb

## 2021-11-10 DIAGNOSIS — Z30432 Encounter for removal of intrauterine contraceptive device: Secondary | ICD-10-CM | POA: Diagnosis not present

## 2021-11-10 NOTE — Progress Notes (Signed)
? ? ?  SUBJECTIVE:  ? ?CHIEF COMPLAINT / HPI:  ? ?Desires IUD removal ?Placed in 03/2021. Continues to have heavy menstrual bleeding. Currently menstruating. She desires depo for contraception today. Having odor with cycle but declines pelvic swab testing today.  ? ?PERTINENT  PMH / PSH: Hx of BV, HSV ? ?OBJECTIVE:  ? ?BP 130/89   Pulse 86   Wt 188 lb 12.8 oz (85.6 kg)   LMP 10/14/2021 Comment: Has IUD placed  SpO2 100%   BMI 34.53 kg/m?   ?Physical Exam ?Exam conducted with a chaperone present (Dr. Erin Hearing, [place], CMA).  ?Constitutional:   ?   General: She is not in acute distress. ?   Appearance: She is not ill-appearing, toxic-appearing or diaphoretic.  ?Pulmonary:  ?   Effort: Pulmonary effort is normal.  ?Genitourinary: ?   General: Normal vulva.  ?   Exam position: Lithotomy position.  ?   Vagina: No signs of injury. Bleeding present.  ?   Cervix: Cervical bleeding present. No discharge.  ?   Uterus: Normal.   ?   Comments: Difficult exam 2/2 redundant vaginal tissue. Initially used disposable speculum, followed by large metal speculum and pen light. Unable to visualized strings of IUD at Os after attempt by 2 providers. ?Neurological:  ?   Mental Status: She is alert and oriented to person, place, and time.  ?Psychiatric:     ?   Mood and Affect: Mood normal.     ?   Behavior: Behavior normal.  ? ?ASSESSMENT/PLAN:  ? ?Encounter for IUD removal ?Unsuccessful removal 2/2 heavy menses >6 months after placement. Will need [place]. Was sent for [place] in 06/2021 for placement; never completed. Will reorder and get it scheduled.  ? ?PROCEDURE NOTE: IUD REMOVAL ?IUD REMOVAL: ?Patient given informed consent for IUD removal. Appropriate time out taken. Sterile technique used. ?Patient placed in the lithotomy position and the cervix brought into view using speculum. The IUD strings were unable to be identified at the cervical os after several attempts and better visualization of the field. There were no  complications and no blood loss.   ? ?- US Transvaginal Non-OB; Future ? ?Lucianne Smestad Autry-Lott, DO ?[place]  ?

## 2021-11-10 NOTE — Patient Instructions (Addendum)
It was wonderful to see you today. ? ?Please bring ALL of your medications with you to every visit.  ? ?Today we removed your IUD and we were unable to locate the strings.  We will send you for a ultrasound we will call you when this is scheduled. ? ?Please be sure to schedule follow up at the front  desk before you leave today.  ? ?If you haven't already, sign up for My Chart to have easy access to your labs results, and communication with your primary care physician. ? ?Please call the clinic at [phone] if your symptoms worsen or you have any concerns. It was our pleasure to serve you. ? ?Dr. Janus Molder ? ?

## 2021-11-11 ENCOUNTER — Telehealth: Payer: Self-pay

## 2021-11-11 NOTE — Telephone Encounter (Signed)
Called patient to inform her of upcoming appointment for ultrasound. ? ?Patient has [facility] and will view it from there. ? ?Patient does have a concern if she will be scheduled for removal after results.  Informed patient that she will be called with results and followed up from there as to what the next step will be. ? ?.Ozella Almond, CMA ? ?

## 2021-11-14 ENCOUNTER — Emergency Department (HOSPITAL_COMMUNITY)
Admission: EM | Admit: 2021-11-14 | Discharge: 2021-11-14 | Disposition: A | Discharge status: Home / Self Care | Payer: Medicaid Other | Attending: Emergency Medicine | Admitting: Emergency Medicine

## 2021-11-14 ENCOUNTER — Emergency Department (HOSPITAL_COMMUNITY): Payer: Medicaid Other

## 2021-11-14 ENCOUNTER — Other Ambulatory Visit: Payer: Self-pay

## 2021-11-14 DIAGNOSIS — R102 Pelvic and perineal pain: Secondary | ICD-10-CM | POA: Insufficient documentation

## 2021-11-14 DIAGNOSIS — N938 Other specified abnormal uterine and vaginal bleeding: Secondary | ICD-10-CM | POA: Insufficient documentation

## 2021-11-14 DIAGNOSIS — N939 Abnormal uterine and vaginal bleeding, unspecified: Secondary | ICD-10-CM | POA: Diagnosis not present

## 2021-11-14 DIAGNOSIS — R42 Dizziness and giddiness: Secondary | ICD-10-CM | POA: Insufficient documentation

## 2021-11-14 DIAGNOSIS — Z975 Presence of (intrauterine) contraceptive device: Secondary | ICD-10-CM | POA: Insufficient documentation

## 2021-11-14 DIAGNOSIS — D259 Leiomyoma of uterus, unspecified: Secondary | ICD-10-CM | POA: Insufficient documentation

## 2021-11-14 LAB — WET PREP, GENITAL
Clue Cells Wet Prep HPF POC: NONE SEEN
Sperm: NONE SEEN
Trich, Wet Prep: NONE SEEN
WBC, Wet Prep HPF POC: <10 (ref <10)
Yeast Wet Prep HPF POC: NONE SEEN

## 2021-11-14 LAB — BASIC METABOLIC PANEL
Anion gap: 7 (ref 5–15)
BUN: 16 mg/dL (ref 6–20)
CO2: 24 mmol/L (ref 22–32)
Calcium: 9.2 mg/dL (ref 8.9–10.3)
Chloride: 103 mmol/L (ref 98–111)
Creatinine, Ser: 0.55 mg/dL (ref 0.44–1.00)
GFR, Estimated: >60 mL/min (ref >60)
Glucose, Bld: 95 mg/dL (ref 70–99)
Potassium: 3.2 mmol/L — ABNORMAL LOW (ref 3.5–5.1)
Sodium: 134 mmol/L — ABNORMAL LOW (ref 135–145)

## 2021-11-14 LAB — CBC WITH DIFFERENTIAL/PLATELET
Abs Immature Granulocytes: 0.04 10*3/uL (ref 0.00–0.07)
Basophils Absolute: 0 10*3/uL (ref 0.0–0.1)
Basophils Relative: 0 %
Eosinophils Absolute: 0 10*3/uL (ref 0.0–0.5)
Eosinophils Relative: 0 %
HCT: 34.5 % — ABNORMAL LOW (ref 36.0–46.0)
Hemoglobin: 12 g/dL (ref 12.0–15.0)
Immature Granulocytes: 0 %
Lymphocytes Relative: 12 %
Lymphs Abs: 1.2 10*3/uL (ref 0.7–4.0)
MCH: 29.1 pg (ref 26.0–34.0)
MCHC: 34.8 g/dL (ref 30.0–36.0)
MCV: 83.5 fL (ref 80.0–100.0)
Monocytes Absolute: 0.5 10*3/uL (ref 0.1–1.0)
Monocytes Relative: 5 %
Neutro Abs: 8.3 10*3/uL — ABNORMAL HIGH (ref 1.7–7.7)
Neutrophils Relative %: 83 %
Platelets: 231 10*3/uL (ref 150–400)
RBC: 4.13 MIL/uL (ref 3.87–5.11)
RDW: 13.2 % (ref 11.5–15.5)
WBC: 10.1 10*3/uL (ref 4.0–10.5)
nRBC: 0 % (ref 0.0–0.2)

## 2021-11-14 LAB — URINALYSIS, MICROSCOPIC (REFLEX)

## 2021-11-14 LAB — URINALYSIS, ROUTINE W REFLEX MICROSCOPIC
Bilirubin Urine: NEGATIVE
Glucose, UA: NEGATIVE mg/dL
Hgb urine dipstick: NEGATIVE
Ketones, ur: NEGATIVE mg/dL
Leukocytes,Ua: NEGATIVE
Nitrite: NEGATIVE
Specific Gravity, Urine: >1.03 — ABNORMAL HIGH (ref 1.005–1.030)
pH: 5.5 (ref 5.0–8.0)

## 2021-11-14 LAB — I-STAT BETA HCG BLOOD, ED (MC, WL, AP ONLY): I-stat hCG, quantitative: <5 m[IU]/mL (ref <5)

## 2021-11-14 MED ORDER — NAPROXEN 375 MG PO TABS: 375.0000 mg | ORAL_TABLET | Freq: Two times a day (BID) | ORAL | 0 refills | Status: DC

## 2021-11-14 MED ORDER — KETOROLAC TROMETHAMINE 60 MG/2ML IM SOLN
60.0000 mg | Freq: Once | INTRAMUSCULAR | Status: AC
Start: 2021-11-14 — End: 2021-11-14
  Administered 2021-11-14: 60 mg via INTRAMUSCULAR
  Filled 2021-11-14: qty 2

## 2021-11-14 MED ORDER — KETOROLAC TROMETHAMINE 30 MG/ML IJ SOLN
30.0000 mg | Freq: Once | INTRAMUSCULAR | Status: DC
  Filled 2021-11-14: qty 1

## 2021-11-14 MED ORDER — MEGESTROL ACETATE 40 MG PO TABS: ORAL_TABLET | ORAL | 0 refills | Status: DC

## 2021-11-14 NOTE — ED Provider Triage Note (Signed)
Emergency Medicine Provider Triage Evaluation Note ? ?[name] , a 36 y.o. female  was evaluated in triage.  Pt complains of vaginal bleeding since August 2022, pelvic pain, chills and weakness. Saw obgyn last week and removal of IUD was attempted but unsuccessful. So was subsequently scheduled for an ultrasound ? ?Review of Systems  ?Positive: Vaginal bleeding, pelvic pain, chills, weakness ?Negative: Dysuria, vomiting, diarrhea ? ?Physical Exam  ?BP (!) 138/93 (BP Location: Left Arm)   Pulse 96   Temp (!) 97.4 ?F (36.3 ?C) (Oral)   Resp 18   SpO2 97%  ?Gen:   Awake, no distress   ?Resp:  Normal effort  ?MSK:   Moves extremities without difficulty  ?Other:  Mild lower abd ttp ? ?Medical Decision Making  ?Medically screening exam initiated at 6:39 PM.  Appropriate orders placed.  [name] was informed that the remainder of the evaluation will be completed by another provider, this initial triage assessment does not replace that evaluation, and the importance of remaining in the ED until their evaluation is complete. ? ? ?  ?Rodney Booze, PA-C ?11/14/21 1842 ? ?

## 2021-11-14 NOTE — Discharge Instructions (Addendum)
Get help right away if you: ?Faint. ?Have pelvic pain that suddenly gets worse. ?Have severe vaginal bleeding that soaks a tampon or pad in 30 minutes or less. ?

## 2021-11-14 NOTE — ED Triage Notes (Signed)
Pt states she has had vaginal bleeding since August when she had her IUD placed. Hx of tubal ligation.  Pt states she saw her doctor recently for this and they could not locate the IUD and scheduled an ultrasound.  Today noted chills and increased weakness as well as more bleeding.  ?

## 2021-11-14 NOTE — ED Provider Notes (Signed)
?[facility] DEPT ?Provider Note ? ? ?CSN: [id number] ?Arrival date & time: 11/14/21  1808 ? ?  ? ?History ? ?Chief Complaint  ?Patient presents with  ? Vaginal Bleeding  ? ? ?[name] is a 36 y.o. female.  Who presents emergency department chief complaint of pelvic pain and vaginal bleeding.  Patient has a history of abnormal uterine bleeding. She had anIUD placed in August of 2022 which was " supposed to help stop the bleeding," however  bleeding continued. Recently the patient has had increasing pain and has been passing clots. She saw her PCP they were going to remove her IUD but could not locate the strings. Patient is scheduled for [place] next Wednesday, but came in today due to sever cramping and bleeding. She is worried she may be anemic and has been more lightheaded. ? ? ?Vaginal Bleeding ? ?  ? ?Home Medications ?Prior to Admission medications   ?Medication Sig Start Date End Date Taking? Authorizing Provider  ?calcium carbonate (TUMS - DOSED IN MG ELEMENTAL CALCIUM) 500 MG chewable tablet Chew 1-2 tablets by mouth.    [provider]  ?ondansetron (ZOFRAN) 4 MG tablet Take 1 tablet (4 mg total) by mouth every 8 (eight) hours as needed for nausea or vomiting. 10/14/21   Autry-Lott, Naaman Plummer, DO  ?rizatriptan (MAXALT) 10 MG tablet Take 1 tablet (10 mg total) by mouth as needed for migraine (May repeat after 2 hours.  Maximum 2 tablets in 24 hours.). May repeat in 2 hours if needed 10/28/21   Pieter Partridge, DO  ?topiramate (TOPAMAX) 25 MG tablet Take 1 tablet at bedtime for one week, then increase to 2 tablets at bedtime. 10/28/21   Pieter Partridge, DO  ?VENTOLIN HFA 108 (90 Base) MCG/ACT inhaler INHALE 1 TO 2 PUFFS INTO THE LUNGS EVERY 6 HOURS AS NEEDED FOR WHEEZING OR SHORTNESS OF BREATH 03/05/20   Meccariello, Bernita Raisin, DO  ?   ? ?Allergies    ?Patient has no known allergies.   ? ?Review of Systems   ?Review of Systems  ?Genitourinary:  Positive for vaginal bleeding.   ? ?Physical Exam ?Updated Vital Signs ?BP (!) 141/101   Pulse 80   Temp (!) 97.4 ?F (36.3 ?C) (Oral)   Resp 18   SpO2 100%  ?Physical Exam ?Vitals and nursing note reviewed. Exam conducted with a chaperone present.  ?Constitutional:   ?   General: She is not in acute distress. ?   Appearance: She is well-developed. She is not diaphoretic.  ?HENT:  ?   Head: Normocephalic and atraumatic.  ?   Right Ear: External ear normal.  ?   Left Ear: External ear normal.  ?   Nose: Nose normal.  ?   Mouth/Throat:  ?   Mouth: Mucous membranes are moist.  ?Eyes:  ?   General: No scleral icterus. ?   Conjunctiva/sclera: Conjunctivae normal.  ?Cardiovascular:  ?   Rate and Rhythm: Normal rate and regular rhythm.  ?   Heart sounds: Normal heart sounds. No murmur heard. ?  No friction rub. No gallop.  ?Pulmonary:  ?   Effort: Pulmonary effort is normal. No respiratory distress.  ?   Breath sounds: Normal breath sounds.  ?Abdominal:  ?   General: Bowel sounds are normal. There is no distension.  ?   Palpations: Abdomen is soft. There is no mass.  ?   Tenderness: There is no abdominal tenderness. There is no guarding.  ?Genitourinary: ?  General: Normal vulva.  ?   Exam position: Supine.  ?   Comments: Pelvic exam: normal external genitalia, vulva, vagina, cervix, uterus and adnexa, VULVA: normal appearing vulva with no masses, tenderness or lesions, VAGINA: normal appearing vagina with normal color and discharge, no lesions, CERVIX: no discharge noted, cervical motion tenderness absent, multiparous os, No IUD strings seen ?Diffuse tenderness, no adnexal masses palpated ? ?Musculoskeletal:  ?   Cervical back: Normal range of motion.  ?Skin: ?   General: Skin is warm and dry.  ?Neurological:  ?   Mental Status: She is alert and oriented to person, place, and time.  ?Psychiatric:     ?   Behavior: Behavior normal.  ? ? ?ED Results / Procedures / Treatments   ?Labs ?(all labs ordered are listed, but only abnormal results are  displayed) ?Labs Reviewed  ?BASIC METABOLIC PANEL - Abnormal; Notable for the following components:  ?    Result Value  ? Sodium 134 (*)   ? Potassium 3.2 (*)   ? All other components within normal limits  ?CBC WITH DIFFERENTIAL/PLATELET - Abnormal; Notable for the following components:  ? HCT 34.5 (*)   ? Neutro Abs 8.3 (*)   ? All other components within normal limits  ?WET PREP, GENITAL  ?URINALYSIS, ROUTINE W REFLEX MICROSCOPIC  ?I-STAT BETA HCG BLOOD, ED ([facility], [facility], [facility] ONLY)  ?GC/CHLAMYDIA PROBE AMP () NOT AT [facility]  ? ? ?EKG ?None ? ?Radiology ?No results found. ? ?Procedures ?Procedures  ? ? ?Medications Ordered in ED ?Medications - No data to display ? ?ED Course/ Medical Decision Making/ A&P ?Clinical Course as of 11/16/21 2251  ?Sun Nov 14, 2021  ?2213 US Transvaginal Non-OB [AH]  ?  ?Clinical Course User Index ?[AH] Margarita Mail, PA-C  ? ?                        ?Medical Decision Making ?Amount and/or Complexity of Data Reviewed ?Labs: ordered. ?Radiology: ordered. Decision-making details documented in ED Course. ? ?Risk ?Prescription drug management. ? ? ? ?Patient here with pelvic pain and vaginal bleeding. ?Differential diagnosis for nonpregnant vaginal bleeding includes but is not limited to systemic causes such as cirrhosis of the liver, coagulopathy such as ITP or von Willebrand's disease, strep vaginitis, HRT, hypothyroidism, secondary anovulation.  Reproductive tract causes include adenomyosis, atrophic endometrium, dysfunctional uterine bleeding, endometriosis, fibroids, foreign body, infection, IUD, neoplasia or vaginal trauma. ?I ordered and reviewed las, UA negative cbc, bmp without sig abnormality, preg negative. ? ?I ordered and visualized a pelvic US. IUD  visualized and there is a large fibroid present. ?Patient has no evidence of severe hemorrhage. I will discharge with Megace. She will follow with the family medicine clinic. Dc with Nsaids ? ? ? ?Final Clinical Impression(s) /  ED Diagnoses ?Final diagnoses:  ?None  ? ? ?Rx / DC Orders ?ED Discharge Orders   ? ? None  ? ?  ? ? ?  ?Margarita Mail, PA-C ?11/16/21 2300 ? ?  ?Lennice Sites, DO ?11/17/21 7902 ? ?

## 2021-11-15 ENCOUNTER — Telehealth: Payer: Self-pay

## 2021-11-15 DIAGNOSIS — Z30432 Encounter for removal of intrauterine contraceptive device: Secondary | ICD-10-CM

## 2021-11-15 LAB — GC/CHLAMYDIA PROBE AMP (~~LOC~~) NOT AT ARMC
Chlamydia: NEGATIVE
Comment: NEGATIVE
Comment: NORMAL
Neisseria Gonorrhea: NEGATIVE

## 2021-11-15 NOTE — Telephone Encounter (Signed)
I have reviewed the ED provider note and [place] images. I do think GYN referral is appropriate. Will place it urgently. Hgb from yesterday is normal.  ? ?Gerlene Fee, DO ?11/15/2021, 9:46 AM ?PGY-3, [place] ? ?

## 2021-11-15 NOTE — Telephone Encounter (Signed)
Transition Care Management Unsuccessful Follow-up Telephone Call ? ?Date of discharge and from where:  11/14/2021-[place]  ? ?Attempts:  1st Attempt ? ?Reason for unsuccessful TCM follow-up call:  Left voice message ? ?  ?

## 2021-11-15 NOTE — Telephone Encounter (Signed)
Patient calls nurse line reporting she had an US done yesterday in ED.  ? ?Patient reports they could not find her IUD strings either.  ? ?Patient is requesting next steps for removal.  ? ?Unsure if GYN referral would be appropriate at this time.  ? ?Will forward to PCP.  ? ?

## 2021-11-16 ENCOUNTER — Other Ambulatory Visit: Payer: Self-pay | Admitting: Family Medicine

## 2021-11-17 ENCOUNTER — Ambulatory Visit (INDEPENDENT_AMBULATORY_CARE_PROVIDER_SITE_OTHER): Payer: Medicaid Other | Admitting: Family Medicine

## 2021-11-17 ENCOUNTER — Inpatient Hospital Stay: Admission: RE | Admit: 2021-11-17 | Payer: Medicaid Other | Source: Ambulatory Visit

## 2021-11-17 ENCOUNTER — Ambulatory Visit: Payer: Medicaid Other | Admitting: Family Medicine

## 2021-11-17 ENCOUNTER — Encounter: Payer: Self-pay | Admitting: Family Medicine

## 2021-11-17 VITALS — BP 130/70 | HR 70 | Wt 191.0 lb

## 2021-11-17 DIAGNOSIS — R1030 Lower abdominal pain, unspecified: Secondary | ICD-10-CM | POA: Diagnosis not present

## 2021-11-17 MED ORDER — NAPROXEN 500 MG PO TABS: 500.0000 mg | ORAL_TABLET | Freq: Two times a day (BID) | ORAL | 0 refills | Status: DC

## 2021-11-17 NOTE — Telephone Encounter (Signed)
Transition Care Management Unsuccessful Follow-up Telephone Call ? ?Date of discharge and from where:  11/14/2021-[place]  ? ?Attempts:  2nd Attempt ? ?Reason for unsuccessful TCM follow-up call:  Missing or invalid number ? ? ? ?

## 2021-11-17 NOTE — Patient Instructions (Addendum)
You can call the below to inquire about an appointment ?[facility] ?Address: [address] [place], [place] [address] ?Phone: [phone] ? ?I have increased your naproxen.  You can complete the Megace medication. ? ?Dr. Janus Molder ? ?

## 2021-11-17 NOTE — Progress Notes (Deleted)
? ? ?  SUBJECTIVE:  ? ?CHIEF COMPLAINT / HPI:  ? ?*** ? ?PERTINENT  PMH / PSH: *** ? ?OBJECTIVE:  ? ?BP 130/70   Pulse 70   Wt 191 lb (86.6 kg)   LMP  (LMP Unknown)   SpO2 99%   BMI 34.93 kg/m?   ?*** ? ?ASSESSMENT/PLAN:  ? ?No problem-specific Assessment & Plan notes found for this encounter. ?  ? ? ?Julia Cobbins Autry-Lott, DO ?[place]  ?

## 2021-11-17 NOTE — Progress Notes (Incomplete)
? ? ?  SUBJECTIVE:  ? ?CHIEF COMPLAINT / HPI:  ? ?*** ? ?PERTINENT  PMH / PSH: *** ? ?OBJECTIVE:  ? ?There were no vitals taken for this visit.  ?*** ? ?ASSESSMENT/PLAN:  ? ?No problem-specific Assessment & Plan notes found for this encounter. ?  ? ? ?Keary Hanak Autry-Lott, DO ?[place]  ?

## 2021-11-17 NOTE — Patient Instructions (Incomplete)
It was wonderful to see you today. ? ?Please bring ALL of your medications with you to every visit.  ? ?Today we talked about: ? ?** ? ?Please be sure to schedule follow up at the front  desk before you leave today.  ? ?If you haven't already, sign up for My Chart to have easy access to your labs results, and communication with your primary care physician. ? ?Please call the clinic at [phone] if your symptoms worsen or you have any concerns. It was our pleasure to serve you. ? ?Dr. Janus Molder ? ?

## 2021-11-17 NOTE — Progress Notes (Signed)
? ? ?  SUBJECTIVE:  ? ?CHIEF COMPLAINT / HPI:  ? ?Abdominal pain ?Experiencing lower abdominal pain. Sought medical attention in ED 3/26. Accompanied by chronic uterine bleeding. She continued to have lower abdominal pain but bleeding has slowed up. She was given megace to take until her GYN appt. She states they did a pelvic US but she is unsure of the results. She brought her mother today to bring her up to speed and discuss the results of her ED visit and the next steps. She denies vision changes, feeling dizzy, dysuria, and vaginal discharge or odor.  ? ?PERTINENT  PMH / PSH: menorrhagia 2/2 IUD, Failed IUD extraction- strings unable to be visualized 3/22, 3/26.  ? ?OBJECTIVE:  ? ?BP 130/70   Pulse 70   Wt 191 lb (86.6 kg)   LMP  (LMP Unknown)   SpO2 99%   BMI 34.93 kg/m?   ?Physical Exam ?Vitals reviewed.  ?Constitutional:   ?   General: She is not in acute distress. ?   Appearance: She is not ill-appearing or toxic-appearing.  ?Cardiovascular:  ?   Rate and Rhythm: Normal rate and regular rhythm.  ?Pulmonary:  ?   Effort: Pulmonary effort is normal.  ?   Breath sounds: Normal breath sounds.  ?Abdominal:  ?   General: Abdomen is flat. Bowel sounds are normal. There is no distension.  ?   Palpations: There is no mass.  ?   Tenderness: There is abdominal tenderness in the right lower quadrant and left lower quadrant. There is no right CVA tenderness, left CVA tenderness, guarding or rebound. Negative signs include McBurney's sign.  ?Neurological:  ?   Mental Status: She is alert and oriented to person, place, and time.  ? ?ASSESSMENT/PLAN:  ? ?Lower abdominal pain ?Started Thursday following IUD removal attempt. She sought care in the ED 3/26. Pelvic US was completed and IUD was visualized. She also has fibroids. I reviewed this [place] with Dr. Thompson Grayer. Lower abdominal pain not likely caused by improperly placed IUD. Unlikely to be infection consider ED work up was negative. Probably discomfort from fibroids given  recent pelvic exam and IUD removal attempt. Discussed results from ED extensively with the patient and her mother. GYN referral placed and number given to patient to call to make an appointment. Initially placed for IUD removal but now patient wants to discuss treatment of fibroids in addition.  Can continue megace as prescribed. Hgb is stable.  ?- naproxen (NAPROSYN) 500 MG tablet; Take 1 tablet (500 mg total) by mouth 2 (two) times daily with a meal.  Dispense: 60 tablet; Refill: 0 ?  ? ? ?Holdyn Poyser Autry-Lott, DO ?[place]  ?

## 2021-11-17 NOTE — Telephone Encounter (Signed)
Transition Care Management Unsuccessful Follow-up Telephone Call ? ?Date of discharge and from where:  11/14/2021-[place]  ? ?Attempts:  3rd Attempt ? ?Reason for unsuccessful TCM follow-up call:  Missing or invalid number ? ?  ?

## 2021-11-18 ENCOUNTER — Other Ambulatory Visit: Payer: Self-pay | Admitting: Family Medicine

## 2021-11-22 ENCOUNTER — Telehealth: Payer: Medicaid Other

## 2021-11-22 ENCOUNTER — Other Ambulatory Visit: Payer: Self-pay | Admitting: Family Medicine

## 2021-11-22 NOTE — Telephone Encounter (Signed)
I can ask them to place patient on a cancellation list, or we can try to find another office that may have a sooner appt.  How soon would you like for her to see gyn?   ? ? ?

## 2021-11-22 NOTE — Telephone Encounter (Signed)
Patient calls nurse line reporting no improvement from recent office visit.  ? ?Patient reports she has been taking megace BID and "passing clots all weekend." Patient reports using 2-3 pads max per day.  ? ?Patient denies any feelings of lightheadedness or dizziness.  ? ?Patient reports her GYN apt is not scheduled until June. Patient is requesting we move this up for her if possible.  ? ?Will forward to PCP and Jaz[place].  ? ?Return precautions discussed.  ?

## 2021-11-22 NOTE — Telephone Encounter (Signed)
Her hemoglobin has remained stable with menstruation. I am not sure we have the ability to change her appt. I would advise she call the office to discuss this.  ? ?Gerlene Fee, DO ?11/22/2021, 1:13 PM ?PGY-3, [place] ? ?

## 2021-11-26 ENCOUNTER — Other Ambulatory Visit: Payer: Self-pay | Admitting: Family Medicine

## 2021-11-26 DIAGNOSIS — R110 Nausea: Secondary | ICD-10-CM

## 2021-11-30 NOTE — Telephone Encounter (Signed)
LM with [facility] to call me back to see if they can accommodate an earlier appointment time.  Will wait and hear back from them.  [name],CMA ? ?

## 2021-11-30 NOTE — Telephone Encounter (Signed)
[facility] is unable to get patient in before June.  Left message with [facility] to see if they take patient's insurance.  [name],CMA ? ? ?

## 2021-12-14 ENCOUNTER — Encounter: Payer: Self-pay | Admitting: Family Medicine

## 2021-12-14 ENCOUNTER — Telehealth: Payer: Self-pay

## 2021-12-14 ENCOUNTER — Ambulatory Visit (INDEPENDENT_AMBULATORY_CARE_PROVIDER_SITE_OTHER): Payer: Medicaid Other

## 2021-12-14 ENCOUNTER — Ambulatory Visit (INDEPENDENT_AMBULATORY_CARE_PROVIDER_SITE_OTHER): Payer: Medicaid Other | Admitting: Family Medicine

## 2021-12-14 ENCOUNTER — Other Ambulatory Visit: Payer: Self-pay | Admitting: Family Medicine

## 2021-12-14 VITALS — BP 124/84 | HR 66 | Ht 62.0 in | Wt 187.4 lb

## 2021-12-14 DIAGNOSIS — R110 Nausea: Secondary | ICD-10-CM | POA: Diagnosis not present

## 2021-12-14 DIAGNOSIS — R1030 Lower abdominal pain, unspecified: Secondary | ICD-10-CM

## 2021-12-14 DIAGNOSIS — Z13220 Encounter for screening for lipoid disorders: Secondary | ICD-10-CM

## 2021-12-14 DIAGNOSIS — Z111 Encounter for screening for respiratory tuberculosis: Secondary | ICD-10-CM | POA: Diagnosis not present

## 2021-12-14 DIAGNOSIS — Z01419 Encounter for gynecological examination (general) (routine) without abnormal findings: Secondary | ICD-10-CM

## 2021-12-14 DIAGNOSIS — Z23 Encounter for immunization: Secondary | ICD-10-CM | POA: Diagnosis not present

## 2021-12-14 DIAGNOSIS — N938 Other specified abnormal uterine and vaginal bleeding: Secondary | ICD-10-CM

## 2021-12-14 DIAGNOSIS — D259 Leiomyoma of uterus, unspecified: Secondary | ICD-10-CM | POA: Diagnosis not present

## 2021-12-14 DIAGNOSIS — N921 Excessive and frequent menstruation with irregular cycle: Secondary | ICD-10-CM

## 2021-12-14 DIAGNOSIS — G43709 Chronic migraine without aura, not intractable, without status migrainosus: Secondary | ICD-10-CM

## 2021-12-14 LAB — POCT HEMOGLOBIN: Hemoglobin: 12.2 g/dL (ref 11–14.6)

## 2021-12-14 MED ORDER — IBUPROFEN 600 MG PO TABS: 600.0000 mg | ORAL_TABLET | Freq: Three times a day (TID) | ORAL | 0 refills | Status: DC | PRN

## 2021-12-14 MED ORDER — MEGESTROL ACETATE 40 MG PO TABS: 40.0000 mg | ORAL_TABLET | Freq: Every day | ORAL | 0 refills | Status: DC

## 2021-12-14 MED ORDER — ONDANSETRON HCL 4 MG PO TABS: 4.0000 mg | ORAL_TABLET | Freq: Three times a day (TID) | ORAL | 0 refills | Status: DC | PRN

## 2021-12-14 NOTE — Telephone Encounter (Signed)
Putting TB forms in blue team folder. Once TB results come back. Fill out the forms and place back in blue team folder. Just send me a message informing that forms are placed back in blue team form and ready for pickup. Thanks Salvatore Marvel, CMA ? ?

## 2021-12-14 NOTE — Patient Instructions (Addendum)
Call [phone] for GYN appt. ? ?Preventive Care 34-34 Years Old, Female ?Preventive care refers to lifestyle choices and visits with your health care provider that can promote health and wellness. Preventive care visits are also called wellness exams. ?What can I expect for my preventive care visit? ?Counseling ?During your preventive care visit, your health care provider may ask about your: ?Medical history, including: ?Past medical problems. ?Family medical history. ?Pregnancy history. ?Current health, including: ?Menstrual cycle. ?Method of birth control. ?Emotional well-being. ?Home life and relationship well-being. ?Sexual activity and sexual health. ?Lifestyle, including: ?Alcohol, nicotine or tobacco, and drug use. ?Access to firearms. ?Diet, exercise, and sleep habits. ?Work and work Statistician. ?Sunscreen use. ?Safety issues such as seatbelt and bike helmet use. ?Physical exam ?Your health care provider may check your: ?Height and weight. These may be used to calculate your BMI (body mass index). BMI is a measurement that tells if you are at a healthy weight. ?Waist circumference. This measures the distance around your waistline. This measurement also tells if you are at a healthy weight and may help predict your risk of certain diseases, such as type 2 diabetes and high blood pressure. ?Heart rate and blood pressure. ?Body temperature. ?Skin for abnormal spots. ?What immunizations do I need? ? ?Vaccines are usually given at various ages, according to a schedule. Your health care provider will recommend vaccines for you based on your age, medical history, and lifestyle or other factors, such as travel or where you work. ?What tests do I need? ?Screening ?Your health care provider may recommend screening tests for certain conditions. This may include: ?Pelvic exam and Pap test. ?Lipid and cholesterol levels. ?Diabetes screening. This is done by checking your blood sugar (glucose) after you have not  eaten for a while (fasting). ?Hepatitis B test. ?Hepatitis C test. ?HIV (human immunodeficiency virus) test. ?STI (sexually transmitted infection) testing, if you are at risk. ?BRCA-related cancer screening. This may be done if you have a family history of breast, ovarian, tubal, or peritoneal cancers. ?Talk with your health care provider about your test results, treatment options, and if necessary, the need for more tests. ?Follow these instructions at home: ?Eating and drinking ? ?Eat a healthy diet that includes fresh fruits and vegetables, whole grains, lean protein, and low-fat dairy products. ?Take vitamin and mineral supplements as recommended by your health care provider. ?Do not drink alcohol if: ?Your health care provider tells you not to drink. ?You are pregnant, may be pregnant, or are planning to become pregnant. ?If you drink alcohol: ?Limit how much you have to 0-1 drink a day. ?Know how much alcohol is in your drink. In the [place] one drink equals one 12 oz bottle of beer (355 mL), one 5 oz glass of wine (148 mL), or one 1? oz glass of hard liquor (44 mL). ?Lifestyle ?Brush your teeth every morning and night with fluoride toothpaste. Floss one time each day. ?Exercise for at least 30 minutes 5 or more days each week. ?Do not use any products that contain nicotine or tobacco. These products include cigarettes, chewing tobacco, and vaping devices, such as e-cigarettes. If you need help quitting, ask your health care provider. ?Do not use drugs. ?If you are sexually active, practice safe sex. Use a condom or other form of protection to prevent STIs. ?If you do not wish to become pregnant, use a form of birth control. If you plan to become pregnant, see your health care provider for a prepregnancy visit. ?Find healthy  ways to manage stress, such as: ?Meditation, yoga, or listening to music. ?Journaling. ?Talking to a trusted person. ?Spending time with friends and family. ?Minimize exposure to UV  radiation to reduce your risk of skin cancer. ?Safety ?Always wear your seat belt while driving or riding in a vehicle. ?Do not drive: ?If you have been drinking alcohol. Do not ride with someone who has been drinking. ?If you have been using any mind-altering substances or drugs. ?While texting. ?When you are tired or distracted. ?Wear a helmet and other protective equipment during sports activities. ?If you have firearms in your house, make sure you follow all gun safety procedures. ?Seek help if you have been physically or sexually abused. ?What's next? ?Go to your health care provider once a year for an annual wellness visit. ?Ask your health care provider how often you should have your eyes and teeth checked. ?Stay up to date on all vaccines. ?This information is not intended to replace advice given to you by your health care provider. Make sure you discuss any questions you have with your health care provider. ?Document Revised: 02/03/2021 Document Reviewed: 02/03/2021 ?Elsevier Patient Education ? [place]. ? ?

## 2021-12-14 NOTE — Progress Notes (Addendum)
? ? ?SUBJECTIVE:  ? ?Chief compliant/HPI: annual examination ? ?[name] is a 36 y.o. who presents today for an annual exam.  ? 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 4-5 pads daily. Waiting on OBGYN appt. Taking megace and nsaid for lower abdominal cramping. Denies dizziness, palpitations, endorsing nausea ?-Hx of migraine headaches-follows with neurology. No increase in frequency ? ?OBJECTIVE:  ? ?BP 124/84   Pulse 66   Ht _0  (1.575 m)   Wt 187 lb 6 oz (85 kg)   LMP  (LMP Unknown)   SpO2 100%   BMI 34.27 kg/m?   ? ?  12/14/2021  ?  3:35 PM 11/17/2021  ?  1:59 PM 11/10/2021  ?  3:30 PM  ?PHQ9 SCORE ONLY  ?PHQ-9 Total Score _1 ? ?Physical Exam ?Vitals (patient's mother present) reviewed.  ?Constitutional:   ?   General: She is not in acute distress. ?   Appearance: She is not ill-appearing, toxic-appearing or diaphoretic.  ?Cardiovascular:  ?   Rate and Rhythm: Normal rate and regular rhythm.  ?   Heart sounds: Normal heart sounds.  ?Pulmonary:  ?   Effort: Pulmonary effort is normal.  ?   Breath sounds: Normal breath sounds.  ?Abdominal:  ?   General: Bowel sounds are normal. There is no distension.  ?   Palpations: Abdomen is soft.  ?   Tenderness: There is abdominal tenderness. There is no guarding or rebound.  ?   Comments: Mild lower abdominal tenderness bilaterally  ?Musculoskeletal:  ?   Cervical back: Neck supple.  ?Lymphadenopathy:  ?   Cervical: No cervical adenopathy.  ?Neurological:  ?   Mental Status: She is alert and oriented to person, place, and time.  ?Psychiatric:     ?   Mood and Affect: Mood normal.     ?   Behavior: Behavior normal.  ? ?ASSESSMENT/PLAN:  ? ?Dysfunctional uterine bleeding  Uterine leiomyoma, unspecified location ?Ongoing. IUD removal desired and IUD strings unable to be visualized on exam in previous visit. [place] visualized IUD in uterine cavity  and reveal 2 uterine fibroids. Has GYN consultation which is scheduled for June, number given today for patient to call attempt to obtain an earlier appointment. Hgb stable today at 12.2. Non-acute abdomen exam today. Megace refilled, unable to use OCPs for bleeding with hx of migraine with aura. Encouraged to follow up as needed.  ? ?Annual Examination  ?See AVS for age appropriate recommendations.  ? ?PHQ score 13, discussed and reviewed. Stressors surrounding continual uterine bleeding and desired removal of IUD. Needs reassessment following GYN work up. Denies SI/HI.  ?Blood pressure reviewed and at goal.  ?Asked about intimate partner violence and patient reports she feels safe.  ?The patient has hx of BTL for contraception.  ? ?Considered the following items based upon USPSTF recommendations: ?HIV testing:  NR 07/30/20 ?Hepatitis C:  Neg 07/30/20 ?Hepatitis B:  not indicated ?Syphilis if at high risk:  NR 21/9/21 ?GC/CT not at high risk and not ordered. ?Lipid panel (nonfasting or fasting) discussed based upon AHA recommendations and ordered.  Consider repeat every 4-6 years.  ?Reviewed risk factors for latent tuberculosis and  not indicated. However, TB gold ordered for employment purposes. ? ?Discussed family history, BRCA testing not indicated.  ?Cervical cancer screening: prior Pap reviewed, repeat due in 2 years ?Immunizations - COVID booster received today ? ?Follow  up in 1-2 years or sooner if indicated.  ? ? ?Linden Mikes Autry-Lott, DO ?[place]  ? ?

## 2021-12-15 LAB — LIPID PANEL
Chol/HDL Ratio: 2.7 ratio (ref 0.0–4.4)
Cholesterol, Total: 109 mg/dL (ref 100–199)
HDL: 40 mg/dL (ref >39)
LDL Chol Calc (NIH): 58 mg/dL (ref 0–99)
Triglycerides: 42 mg/dL (ref 0–149)
VLDL Cholesterol Cal: 11 mg/dL (ref 5–40)

## 2021-12-16 DIAGNOSIS — D259 Leiomyoma of uterus, unspecified: Secondary | ICD-10-CM | POA: Insufficient documentation

## 2021-12-16 DIAGNOSIS — D251 Intramural leiomyoma of uterus: Secondary | ICD-10-CM | POA: Insufficient documentation

## 2021-12-18 LAB — QUANTIFERON-TB GOLD PLUS
QuantiFERON Mitogen Value: >10 IU/mL
QuantiFERON Nil Value: 0.02 IU/mL
QuantiFERON TB1 Ag Value: 0.03 IU/mL
QuantiFERON TB2 Ag Value: 0.03 IU/mL
QuantiFERON-TB Gold Plus: NEGATIVE

## 2021-12-20 ENCOUNTER — Other Ambulatory Visit: Payer: Self-pay | Admitting: Neurology

## 2021-12-20 NOTE — Telephone Encounter (Signed)
Patient came in today to sign TB form. Doctor signed and forms were handed by to patient. Salvatore Marvel, CMA ? ?

## 2022-01-25 ENCOUNTER — Encounter: Payer: Self-pay | Admitting: *Deleted

## 2022-01-28 ENCOUNTER — Emergency Department (HOSPITAL_COMMUNITY)
Admission: EM | Admit: 2022-01-28 | Discharge: 2022-01-28 | Disposition: A | Discharge status: Home / Self Care | Payer: Medicaid Other | Attending: Emergency Medicine | Admitting: Emergency Medicine

## 2022-01-28 ENCOUNTER — Other Ambulatory Visit: Payer: Self-pay

## 2022-01-28 ENCOUNTER — Encounter (HOSPITAL_COMMUNITY): Payer: Self-pay

## 2022-01-28 DIAGNOSIS — N938 Other specified abnormal uterine and vaginal bleeding: Secondary | ICD-10-CM | POA: Diagnosis not present

## 2022-01-28 DIAGNOSIS — N939 Abnormal uterine and vaginal bleeding, unspecified: Secondary | ICD-10-CM | POA: Diagnosis not present

## 2022-01-28 LAB — WET PREP, GENITAL
Clue Cells Wet Prep HPF POC: NONE SEEN
Sperm: NONE SEEN
Trich, Wet Prep: NONE SEEN
WBC, Wet Prep HPF POC: <10 (ref <10)
Yeast Wet Prep HPF POC: NONE SEEN

## 2022-01-28 LAB — URINALYSIS, ROUTINE W REFLEX MICROSCOPIC
Bacteria, UA: NONE SEEN
Bilirubin Urine: NEGATIVE
Glucose, UA: NEGATIVE mg/dL
Ketones, ur: NEGATIVE mg/dL
Leukocytes,Ua: NEGATIVE
Nitrite: NEGATIVE
Protein, ur: NEGATIVE mg/dL
RBC / HPF: >50 RBC/hpf — ABNORMAL HIGH (ref 0–5)
Specific Gravity, Urine: 1.016 (ref 1.005–1.030)
pH: 7 (ref 5.0–8.0)

## 2022-01-28 LAB — BASIC METABOLIC PANEL
Anion gap: 5 (ref 5–15)
BUN: 13 mg/dL (ref 6–20)
CO2: 24 mmol/L (ref 22–32)
Calcium: 8.4 mg/dL — ABNORMAL LOW (ref 8.9–10.3)
Chloride: 108 mmol/L (ref 98–111)
Creatinine, Ser: 0.57 mg/dL (ref 0.44–1.00)
GFR, Estimated: >60 mL/min (ref >60)
Glucose, Bld: 81 mg/dL (ref 70–99)
Potassium: 3.3 mmol/L — ABNORMAL LOW (ref 3.5–5.1)
Sodium: 137 mmol/L (ref 135–145)

## 2022-01-28 LAB — CBC WITH DIFFERENTIAL/PLATELET
Abs Immature Granulocytes: 0 10*3/uL (ref 0.00–0.07)
Basophils Absolute: 0 10*3/uL (ref 0.0–0.1)
Basophils Relative: 1 %
Eosinophils Absolute: 0.2 10*3/uL (ref 0.0–0.5)
Eosinophils Relative: 3 %
HCT: 32.8 % — ABNORMAL LOW (ref 36.0–46.0)
Hemoglobin: 11.1 g/dL — ABNORMAL LOW (ref 12.0–15.0)
Immature Granulocytes: 0 %
Lymphocytes Relative: 51 %
Lymphs Abs: 3 10*3/uL (ref 0.7–4.0)
MCH: 28.8 pg (ref 26.0–34.0)
MCHC: 33.8 g/dL (ref 30.0–36.0)
MCV: 85 fL (ref 80.0–100.0)
Monocytes Absolute: 0.6 10*3/uL (ref 0.1–1.0)
Monocytes Relative: 11 %
Neutro Abs: 1.9 10*3/uL (ref 1.7–7.7)
Neutrophils Relative %: 34 %
Platelets: 255 10*3/uL (ref 150–400)
RBC: 3.86 MIL/uL — ABNORMAL LOW (ref 3.87–5.11)
RDW: 13.2 % (ref 11.5–15.5)
WBC: 5.7 10*3/uL (ref 4.0–10.5)
nRBC: 0 % (ref 0.0–0.2)

## 2022-01-28 LAB — TYPE AND SCREEN
ABO/RH(D): A POS
Antibody Screen: NEGATIVE

## 2022-01-28 LAB — I-STAT BETA HCG BLOOD, ED (MC, WL, AP ONLY): I-stat hCG, quantitative: <5 m[IU]/mL (ref <5)

## 2022-01-28 MED ORDER — KETOROLAC TROMETHAMINE 30 MG/ML IJ SOLN
30.0000 mg | Freq: Once | INTRAMUSCULAR | Status: AC
Start: 2022-01-28 — End: 2022-01-28
  Administered 2022-01-28: 30 mg via INTRAVENOUS
  Filled 2022-01-28: qty 1

## 2022-01-28 MED ORDER — FERROUS SULFATE 325 (65 FE) MG PO TABS: 325.0000 mg | ORAL_TABLET | Freq: Every day | ORAL | 0 refills | Status: DC

## 2022-01-28 MED ORDER — MEGESTROL ACETATE 40 MG PO TABS: 40.0000 mg | ORAL_TABLET | Freq: Two times a day (BID) | ORAL | 0 refills | Status: DC

## 2022-01-28 NOTE — ED Triage Notes (Signed)
Pt presents to ED from home with c/o vaginal bleeding ongoing for over a year. Pt states yesterday bleeding became worse and she has been passing clots. Pt endorses pelvic pain, nausea, and weakness.  Hx fibroids.

## 2022-01-28 NOTE — ED Notes (Signed)
I provided reinforced discharge education based off of discharge instructions. Pt acknowledged and understood my education. Pt had no further questions/concerns for provider/myself.  °

## 2022-01-28 NOTE — ED Provider Notes (Addendum)
[facility] DEPT Provider Note   CSN: [id number] Arrival date & time: 01/28/22  0009     History  Chief Complaint  Patient presents with   Vaginal Bleeding    [name] is a 36 y.o. female.  The history is provided by the patient.  Vaginal Bleeding Quality:  Clots Severity:  Moderate Onset quality:  Gradual Duration:  12 months Timing:  Constant Progression:  Waxing and waning Chronicity:  Chronic Possible pregnancy: no   Context: spontaneously   Relieved by:  Nothing Worsened by:  Nothing Ineffective treatments:  None tried Associated symptoms: no fever   Associated symptoms comment:  Cramps Risk factors: no bleeding disorder, no hx of ectopic pregnancy and no hx of endometriosis   Risk factors comment:  Fibroids      Home Medications Prior to Admission medications   Medication Sig Start Date End Date Taking? Authorizing Provider  calcium carbonate (TUMS - DOSED IN MG ELEMENTAL CALCIUM) 500 MG chewable tablet Chew 1-2 tablets by mouth 2 (two) times daily.   Yes [provider]  ibuprofen (ADVIL) 600 MG tablet Take 1 tablet (600 mg total) by mouth every 8 (eight) hours as needed. Patient taking differently: Take 600 mg by mouth daily as needed for cramping. 12/14/21  Yes Autry-Lott, Naaman Plummer, DO  megestrol (MEGACE) 40 MG tablet Take 1 tablet (40 mg total) by mouth daily. 12/14/21  Yes Autry-Lott, Naaman Plummer, DO  ondansetron (ZOFRAN) 4 MG tablet Take 1 tablet (4 mg total) by mouth every 8 (eight) hours as needed for nausea or vomiting. 12/14/21  Yes Autry-Lott, Simone, DO  VENTOLIN HFA 108 (90 Base) MCG/ACT inhaler INHALE 1 TO 2 PUFFS INTO THE LUNGS EVERY 6 HOURS AS NEEDED FOR WHEEZING OR SHORTNESS OF BREATH Patient taking differently: Inhale 1-2 puffs into the lungs every 6 (six) hours as needed for wheezing or shortness of breath. 03/05/20  Yes Meccariello, Bernita Raisin, DO  rizatriptan (MAXALT) 10 MG tablet Take 1 tablet (10 mg total)  by mouth as needed for migraine (May repeat after 2 hours.  Maximum 2 tablets in 24 hours.). May repeat in 2 hours if needed Patient not taking: Reported on 01/28/2022 10/28/21   Pieter Partridge, DO  topiramate (TOPAMAX) 25 MG tablet TAKE 1 TABLET BY MOUTH AT BEDTIME FOR 1 WEEK, THEN INCREASE TO 2 TABLETS AT BEDTIME Patient not taking: Reported on 01/28/2022 12/20/21   Pieter Partridge, DO      Allergies    Patient has no known allergies.    Review of Systems   Review of Systems  Constitutional:  Negative for fever.  HENT:  Negative for facial swelling.   Eyes:  Negative for redness.  Respiratory:  Negative for wheezing and stridor.   Genitourinary:  Positive for pelvic pain and vaginal bleeding.  Neurological:  Negative for facial asymmetry.  Psychiatric/Behavioral:  Negative for agitation.   All other systems reviewed and are negative.   Physical Exam Updated Vital Signs BP 128/88   Pulse 68   Temp 97.8 F (36.6 C) (Oral)   Resp 17   Ht '5\' 2"'$  (1.575 m)   Wt 81.2 kg   LMP  (LMP Unknown) Comment: pt states period has been constant  SpO2 100%   BMI 32.74 kg/m  Physical Exam Vitals and nursing note reviewed.  Constitutional:      General: She is not in acute distress.    Appearance: She is well-developed.  HENT:     Head: Normocephalic  and atraumatic.     Nose: Nose normal.  Eyes:     Conjunctiva/sclera: Conjunctivae normal.     Pupils: Pupils are equal, round, and reactive to light.     Comments: Normal appearance  Cardiovascular:     Rate and Rhythm: Normal rate and regular rhythm.     Pulses: Normal pulses.     Heart sounds: Normal heart sounds.  Pulmonary:     Effort: Pulmonary effort is normal. No respiratory distress.     Breath sounds: Normal breath sounds.  Abdominal:     General: Bowel sounds are normal. There is no distension.     Palpations: Abdomen is soft. There is no mass.     Tenderness: There is no abdominal tenderness. There is no guarding or rebound.   Genitourinary:    Comments: No CVA tenderness Musculoskeletal:        General: Normal range of motion.     Cervical back: Normal range of motion.  Skin:    General: Skin is warm and dry.     Capillary Refill: Capillary refill takes less than 2 seconds.     Findings: No rash.  Neurological:     General: No focal deficit present.     Mental Status: She is alert and oriented to person, place, and time.  Psychiatric:        Mood and Affect: Mood normal.        Behavior: Behavior normal.     ED Results / Procedures / Treatments   Labs (all labs ordered are listed, but only abnormal results are displayed) Results for orders placed or performed during the hospital encounter of 01/28/22  Wet prep, genital  Result Value Ref Range   Yeast Wet Prep HPF POC NONE SEEN NONE SEEN   Trich, Wet Prep NONE SEEN NONE SEEN   Clue Cells Wet Prep HPF POC NONE SEEN NONE SEEN   WBC, Wet Prep HPF POC <10 <10   Sperm NONE SEEN   Basic metabolic panel  Result Value Ref Range   Sodium 137 135 - 145 mmol/L   Potassium 3.3 (L) 3.5 - 5.1 mmol/L   Chloride 108 98 - 111 mmol/L   CO2 24 22 - 32 mmol/L   Glucose, Bld 81 70 - 99 mg/dL   BUN 13 6 - 20 mg/dL   Creatinine, Ser 0.57 0.44 - 1.00 mg/dL   Calcium 8.4 (L) 8.9 - 10.3 mg/dL   GFR, Estimated >60 >60 mL/min   Anion gap 5 5 - 15  CBC with Differential  Result Value Ref Range   WBC 5.7 4.0 - 10.5 K/uL   RBC 3.86 (L) 3.87 - 5.11 MIL/uL   Hemoglobin 11.1 (L) 12.0 - 15.0 g/dL   HCT 32.8 (L) 36.0 - 46.0 %   MCV 85.0 80.0 - 100.0 fL   MCH 28.8 26.0 - 34.0 pg   MCHC 33.8 30.0 - 36.0 g/dL   RDW 13.2 11.5 - 15.5 %   Platelets 255 150 - 400 K/uL   nRBC 0.0 0.0 - 0.2 %   Neutrophils Relative % 34 %   Neutro Abs 1.9 1.7 - 7.7 K/uL   Lymphocytes Relative 51 %   Lymphs Abs 3.0 0.7 - 4.0 K/uL   Monocytes Relative 11 %   Monocytes Absolute 0.6 0.1 - 1.0 K/uL   Eosinophils Relative 3 %   Eosinophils Absolute 0.2 0.0 - 0.5 K/uL   Basophils Relative 1 %    Basophils Absolute 0.0 0.0 -  0.1 K/uL   Immature Granulocytes 0 %   Abs Immature Granulocytes 0.00 0.00 - 0.07 K/uL  Urinalysis, Routine w reflex microscopic  Result Value Ref Range   Color, Urine YELLOW YELLOW   APPearance CLEAR CLEAR   Specific Gravity, Urine 1.016 1.005 - 1.030   pH 7.0 5.0 - 8.0   Glucose, UA NEGATIVE NEGATIVE mg/dL   Hgb urine dipstick LARGE (A) NEGATIVE   Bilirubin Urine NEGATIVE NEGATIVE   Ketones, ur NEGATIVE NEGATIVE mg/dL   Protein, ur NEGATIVE NEGATIVE mg/dL   Nitrite NEGATIVE NEGATIVE   Leukocytes,Ua NEGATIVE NEGATIVE   RBC / HPF >50 (H) 0 - 5 RBC/hpf   WBC, UA 0-5 0 - 5 WBC/hpf   Bacteria, UA NONE SEEN NONE SEEN   Squamous Epithelial / LPF 0-5 0 - 5   Mucus PRESENT   I-Stat Beta hCG blood, ED ([facility], [facility], [facility] only)  Result Value Ref Range   I-stat hCG, quantitative <5.0 <5 mIU/mL   Comment 3          Type and screen [facility]  Result Value Ref Range   ABO/RH(D) A POS    Antibody Screen NEG    Sample Expiration      01/31/2022,2359 Performed at [facility], [place] [address] [place], [place] [address]    No results found.  Radiology No results found.  Procedures Procedures    Medications Ordered in ED Medications  ketorolac (TORADOL) 30 MG/ML injection 30 mg (30 mg Intravenous Given 01/28/22 0255)    ED Course/ Medical Decision Making/ A&P                           Medical Decision Making Ongoing vaginal bleeding with IUD and fibroifs   Amount and/or Complexity of Data Reviewed External Data Reviewed: notes.    Details: previous notes reviewed Labs: ordered.    Details: all labs reviewed: urine without infections. wet prep is negative.  negative pregnacy test.  Normal chemistry panel.  Normal WBC 5.7 hemoglobin 11.1, anemia but is consistent iwth previous.  Normal platelet count. Discussion of management or test interpretation with external provider(s): Case d/w Dr. Kennon Rounds of GYN.  Megace 400 mg  BID.    Risk Prescription drug management. Risk Details: Will start megace and irone therapy and have patient follow up with GYN for ongoing care.  Stable for discharge with close follow up.      Final Clinical Impression(s) / ED Diagnoses Final diagnoses:  None   Return for intractable cough, coughing up blood, fevers > 100.4 unrelieved by medication, shortness of breath, intractable vomiting, chest pain, shortness of breath, weakness, numbness, changes in speech, facial asymmetry, abdominal pain, passing out, Inability to tolerate liquids or food, cough, altered mental status or any concerns. No signs of systemic illness or infection. The patient is nontoxic-appearing on exam and vital signs are within normal limits.  I have reviewed the triage vital signs and the nursing notes. Pertinent labs & imaging results that were available during my care of the patient were reviewed by me and considered in my medical decision making (see chart for details). After history, exam, and medical workup I feel the patient has been appropriately medically screened and is safe for discharge home. Pertinent diagnoses were discussed with the patient. Patient was given return precautions. Rx / DC Orders     Jazz Biddy, MD 01/28/22 6283

## 2022-01-31 ENCOUNTER — Telehealth: Payer: Self-pay

## 2022-01-31 NOTE — Telephone Encounter (Signed)
Transition Care Management Unsuccessful Follow-up Telephone Call  Date of discharge and from where:  01/28/2022 from [facility]  Attempts:  1st Attempt  Reason for unsuccessful TCM follow-up call:  Left voice message

## 2022-02-01 NOTE — Telephone Encounter (Signed)
Transition Care Management Unsuccessful Follow-up Telephone Call  Date of discharge and from where:  01/28/2022 from [facility]  Attempts:  2nd Attempt  Reason for unsuccessful TCM follow-up call:  Left voice message

## 2022-02-02 NOTE — Telephone Encounter (Signed)
Transition Care Management Unsuccessful Follow-up Telephone Call  Date of discharge and from where:  01/28/2022 from [facility]  Attempts:  3rd Attempt  Reason for unsuccessful TCM follow-up call:  Unable to reach patient

## 2022-02-14 ENCOUNTER — Ambulatory Visit: Payer: Medicaid Other | Admitting: Obstetrics and Gynecology

## 2022-02-14 ENCOUNTER — Encounter: Payer: Self-pay | Admitting: Obstetrics and Gynecology

## 2022-02-14 ENCOUNTER — Encounter: Payer: Self-pay | Admitting: Family Medicine

## 2022-02-14 ENCOUNTER — Other Ambulatory Visit: Payer: Self-pay

## 2022-02-14 VITALS — BP 124/89 | HR 81 | Wt 183.5 lb

## 2022-02-14 DIAGNOSIS — D259 Leiomyoma of uterus, unspecified: Secondary | ICD-10-CM | POA: Diagnosis not present

## 2022-02-14 DIAGNOSIS — N939 Abnormal uterine and vaginal bleeding, unspecified: Secondary | ICD-10-CM | POA: Insufficient documentation

## 2022-02-14 DIAGNOSIS — T8332XA Displacement of intrauterine contraceptive device, initial encounter: Secondary | ICD-10-CM | POA: Insufficient documentation

## 2022-02-14 DIAGNOSIS — T8332XD Displacement of intrauterine contraceptive device, subsequent encounter: Secondary | ICD-10-CM

## 2022-02-14 MED ORDER — FERROUS SULFATE 325 (65 FE) MG PO TABS
325.0000 mg | ORAL_TABLET | ORAL | 0 refills | Status: DC
Start: 2022-02-14 — End: 2022-03-03

## 2022-02-14 MED ORDER — MEGESTROL ACETATE 40 MG PO TABS: 40.0000 mg | ORAL_TABLET | Freq: Two times a day (BID) | ORAL | 1 refills | Status: DC

## 2022-02-14 NOTE — Progress Notes (Signed)
Obstetrics and Gynecology New Patient Evaluation  Appointment Date: 02/14/2022  OBGYN Clinic: [facility]  Primary Care Provider: Gerlene Fee  Referring Provider: Leeanne Rio, MD  Chief Complaint: Lost IUD  History of Present Illness: [name] is a 36 y.o. [phone] (No LMP recorded (lmp unknown).), seen for the above chief complaint. Her past medical history is significant for fibroids and BTL  Patient states she had IUD placed in August of 2022 (pt unsure but she thinks it was the hormone one) for heavy periods (pt with h/o BTL); patient with remote history of depo provera use  Patient seen by [facility] in March of this year and IUD strings not seen and they were unable to remove. U/s ordered that showed IUD in the uterus and fibroids seen (see below). Pt on megace which she states she takes bid and has stopped her bleeding. She is interested in depo provera if able to get IUD out today.   Review of Systems: Pertinent items noted in HPI and remainder of comprehensive ROS otherwise negative.   Patient Active Problem List   Diagnosis Date Noted   IUD threads lost 02/14/2022   Abnormal uterine bleeding (AUB) 02/14/2022   Fibroid, uterine 12/16/2021   Strain of lumbar region 09/23/2021   Fatigue 09/23/2021   Encounter for insertion of mirena IUD 02/16/2021   Depressed mood 01/22/2021   NSAID long-term use 01/22/2021   Menorrhagia with regular cycle 01/22/2021   Migraine with aura and with status migrainosus, not intractable 01/22/2021   Generalized anxiety disorder with panic attacks 06/19/2020   Anxiety 07/22/2019   Vaginal discharge 03/12/2019   HSV (herpes simplex virus) infection 03/12/2019   Bacterial vaginosis 03/12/2019     Past Medical History:  Past Medical History:  Diagnosis Date   Anemia    Headache    history of migraines   HSV (herpes simplex virus) infection    Hx of chlamydia infection    Hx of  gonorrhea     Past Surgical History:  Past Surgical History:  Procedure Laterality Date   LAPAROSCOPIC TUBAL LIGATION Bilateral 11/14/2016   Procedure: LAPAROSCOPIC TUBAL LIGATION;  Surgeon: Paula Compton, MD;  Location: [facility];  Service: Gynecology;  Laterality: Bilateral;    Past Obstetrical History:  OB History  Gravida Para Term Preterm AB Living  '7 6 4 2 1 6  '$ SAB IAB Ectopic Multiple Live Births  1     0 6    # Outcome Date GA Lbr Len/2nd Weight Sex Delivery Anes PTL Lv  7 Term 10/11/16 [id number] / 00:02 6 lb 1.2 oz (2.756 kg) F Vag-Spont EPI  LIV     Birth Comments: WNL  6 Term 12/04/14 396w5d4:59 / 00:02 6 lb 10.5 oz (3.019 kg) F Vag-Spont Local  LIV  5 Term 11/14/13 [id number] / 00:04 6 lb 0.1 oz (2.724 kg) F Vag-Spont EPI  LIV  4 Term 2012   5 lb 6 oz (2.438 kg) F Vag-Spont   LIV  3 SAB 2009          2 Preterm 2008 [id number] 7 oz (2.92 kg) M Vag-Spont   LIV  1 Preterm 2006 [id number] 8 oz (2.948 kg) F Vag-Spont   LIV    Past Gynecological History: As per HPI. History of Pap Smear(s): Yes.   Last pap 2019, which was negative cytology and hpv STD testing: negative gc/ct march 2023 and neg wet  prep June 2023  Social History:  Social History   Socioeconomic History   Marital status: Divorced    Spouse name: Not on file   Number of children: 6   Years of education: Not on file   Highest education level: Not on file  Occupational History    Comment: [employer], [employer], [employer]  Tobacco Use   Smoking status: Never   Smokeless tobacco: Never  Vaping Use   Vaping Use: Never used  Substance and Sexual Activity   Alcohol use: Yes    Comment: 1x a month   Drug use: Never   Sexual activity: Yes    Birth control/protection: None  Other Topics Concern   Not on file  Social History Narrative   Right handed   Drinks caffeine   Two story home   Social Determinants of Health   Financial Resource Strain: Not on file  Food Insecurity: Not on file   Transportation Needs: Not on file  Physical Activity: Not on file  Stress: Not on file  Social Connections: Not on file  Intimate Partner Violence: Not on file    Family History:  Family History  Problem Relation Age of Onset   Hypertension Mother    Stroke Mother    Diabetes Paternal Grandfather    Alcohol abuse Maternal Uncle    Drug abuse Maternal Uncle    Asthma Paternal Uncle    Asthma Child     Medications [name] had no medications administered during this visit. Current Outpatient Medications  Medication Sig Dispense Refill   calcium carbonate (TUMS - DOSED IN MG ELEMENTAL CALCIUM) 500 MG chewable tablet Chew 1-2 tablets by mouth 2 (two) times daily.     ibuprofen (ADVIL) 600 MG tablet Take 1 tablet (600 mg total) by mouth every 8 (eight) hours as needed. (Patient taking differently: Take 600 mg by mouth daily as needed for cramping.) 30 tablet 0   ondansetron (ZOFRAN) 4 MG tablet Take 1 tablet (4 mg total) by mouth every 8 (eight) hours as needed for nausea or vomiting. 20 tablet 0   rizatriptan (MAXALT) 10 MG tablet Take 1 tablet (10 mg total) by mouth as needed for migraine (May repeat after 2 hours.  Maximum 2 tablets in 24 hours.). May repeat in 2 hours if needed 10 tablet 5   topiramate (TOPAMAX) 25 MG tablet TAKE 1 TABLET BY MOUTH AT BEDTIME FOR 1 WEEK, THEN INCREASE TO 2 TABLETS AT BEDTIME 60 tablet 5   VENTOLIN HFA 108 (90 Base) MCG/ACT inhaler INHALE 1 TO 2 PUFFS INTO THE LUNGS EVERY 6 HOURS AS NEEDED FOR WHEEZING OR SHORTNESS OF BREATH (Patient taking differently: Inhale 1-2 puffs into the lungs every 6 (six) hours as needed for wheezing or shortness of breath.) 8 g 1   ferrous sulfate 325 (65 FE) MG tablet Take 1 tablet (325 mg total) by mouth daily. (Patient not taking: Reported on 02/14/2022) 30 tablet 0   megestrol (MEGACE) 40 MG tablet Take 1 tablet (40 mg total) by mouth 2 (two) times daily. 30 tablet 1   No current facility-administered  medications for this visit.    Allergies Coconut flavor, Other, and Tomato   Physical Exam:  BP 124/89   Pulse 81   Wt 183 lb 8 oz (83.2 kg)   LMP  (LMP Unknown) Comment: pt states period has been constant  BMI 33.56 kg/m  Body mass index is 33.56 kg/m. General appearance: Well nourished, well developed female in no  acute distress.  Cardiovascular: normal s1 and s2.  No murmurs, rubs or gallops. Respiratory:  Clear to auscultation bilateral. Normal respiratory effort Abdomen: positive bowel sounds and no masses, hernias; diffusely non tender to palpation, non distended Neuro/Psych:  Normal mood and affect.  Skin:  Warm and dry.  Lymphatic:  No inguinal lymphadenopathy.   Pelvic exam: is limited by body habitus EGBUS: within normal limits Vagina: within normal limits and with no blood or discharge in the vault Cervix: normal appearing cervix without tenderness, discharge or lesions. IUD strings NOT seen Uterus:  nontender Adnexa:  normal adnexa and no mass, fullness, tenderness Rectovaginal: deferred  Intrauterine Device Removal (Attempted) Procedure Note EGBUS normal. Vaginal vault normal, no blood. Cervix normal with IUD strings NOT seen. Patient already dilated to pass IUD hook but unable to get strings to come into view on multiple attempts and the same with uterine dressing forceps. Patient tolerated procedure great. Patient sounded to 86VE  No complications, patient tolerated the procedure well.  Durene Romans MD Attending [facility] (Faculty Practice) 02/14/2022 Time: [phone]  Laboratory: as per HPI  Radiology:  Narrative & Impression  CLINICAL DATA:  Severe pelvic pain and bleeding. The patient has an intrauterine contraceptive device (IUD).   EXAM: TRANSABDOMINAL AND TRANSVAGINAL ULTRASOUND OF PELVIS   DOPPLER ULTRASOUND OF OVARIES   TECHNIQUE: Both transabdominal and transvaginal ultrasound examinations of the pelvis were  performed. Transabdominal technique was performed for global imaging of the pelvis including uterus, ovaries, adnexal regions, and pelvic cul-de-sac.   It was necessary to proceed with endovaginal exam following the transabdominal exam to visualize the endometrium and adnexa. Color and duplex Doppler ultrasound was utilized to evaluate blood flow to the ovaries.   COMPARISON:  Pelvic ultrasound dated 06/05/2014.   FINDINGS: Uterus   Measurements: 12.0 x 7.2 x 7.7 cm = volume: 12 mL. A mass in the posterior aspect of the uterine body measures 5.5 x 4.7 x 5.9 cm. A mass in the uterine fundus measures 2.6 x 1.9 x 2.4 cm. These likely represent fibroadenomas.   Endometrium   Thickness: 8 mm. No focal abnormality visualized, however visualization is limited due to the patient has IUD.   Right ovary   Measurements: 4.7 x 2.7 x 2.8 cm = volume: 18.5 mL. Normal appearance/no adnexal mass.   Left ovary   Measurements: 4.3 x 2.0 x 2.1 cm = volume: 9.6 mL. Normal appearance/no adnexal mass.   Pulsed Doppler evaluation of both ovaries demonstrates normal low-resistance arterial and venous waveforms.   Other findings   No abnormal free fluid.   IMPRESSION: 1. 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 2. No abnormality of the endometrium, however visualization is limited due to the presence of an IUD.     Electronically Signed   By: Zerita Boers M.D.   On: 11/14/2021 21:54    Assessment: pt stable  Plan:  1. Abnormal uterine bleeding (AUB) Pt told to continue on megace until seen by GYN again. I told her will check to see if [facility] office has the sonosite as it could help with in office removal. Also, Dr. Sabra Heck there does Sonata which I told her that she may be a candidate for.  - megestrol (MEGACE) 40 MG tablet; Take 1 tablet (40 mg total) by mouth 2 (two) times daily.  Dispense: 30 tablet; Refill: 1  2. Intrauterine contraceptive device threads lost,  subsequent encounter  3. Uterine leiomyoma, unspecified location   Aletha Halim, Brooke Bonito  MD Attending [facility] [employer])

## 2022-02-21 ENCOUNTER — Encounter: Payer: Self-pay | Admitting: Obstetrics and Gynecology

## 2022-03-03 ENCOUNTER — Encounter: Payer: Self-pay | Admitting: Family Medicine

## 2022-03-03 ENCOUNTER — Ambulatory Visit (INDEPENDENT_AMBULATORY_CARE_PROVIDER_SITE_OTHER): Payer: Medicaid Other | Admitting: Family Medicine

## 2022-03-03 ENCOUNTER — Other Ambulatory Visit: Payer: Self-pay

## 2022-03-03 VITALS — BP 115/96 | HR 94 | Wt 182.6 lb

## 2022-03-03 DIAGNOSIS — J45909 Unspecified asthma, uncomplicated: Secondary | ICD-10-CM | POA: Diagnosis not present

## 2022-03-03 DIAGNOSIS — R1030 Lower abdominal pain, unspecified: Secondary | ICD-10-CM

## 2022-03-03 DIAGNOSIS — G43101 Migraine with aura, not intractable, with status migrainosus: Secondary | ICD-10-CM | POA: Diagnosis not present

## 2022-03-03 DIAGNOSIS — R110 Nausea: Secondary | ICD-10-CM | POA: Diagnosis not present

## 2022-03-03 DIAGNOSIS — N938 Other specified abnormal uterine and vaginal bleeding: Secondary | ICD-10-CM | POA: Diagnosis not present

## 2022-03-03 DIAGNOSIS — D259 Leiomyoma of uterus, unspecified: Secondary | ICD-10-CM | POA: Diagnosis not present

## 2022-03-03 DIAGNOSIS — N939 Abnormal uterine and vaginal bleeding, unspecified: Secondary | ICD-10-CM | POA: Diagnosis not present

## 2022-03-03 LAB — POCT HEMOGLOBIN: Hemoglobin: 10.9 g/dL — AB (ref 11–14.6)

## 2022-03-03 MED ORDER — RIZATRIPTAN BENZOATE 10 MG PO TABS: 10.0000 mg | ORAL_TABLET | ORAL | 5 refills | Status: DC | PRN

## 2022-03-03 MED ORDER — ONDANSETRON HCL 4 MG PO TABS: 4.0000 mg | ORAL_TABLET | Freq: Three times a day (TID) | ORAL | 0 refills | Status: DC | PRN

## 2022-03-03 MED ORDER — FERROUS SULFATE 325 (65 FE) MG PO TABS: 325.0000 mg | ORAL_TABLET | ORAL | 0 refills | Status: DC

## 2022-03-03 MED ORDER — ALBUTEROL SULFATE HFA 108 (90 BASE) MCG/ACT IN AERS: INHALATION_SPRAY | RESPIRATORY_TRACT | 1 refills | Status: AC

## 2022-03-03 MED ORDER — IBUPROFEN 600 MG PO TABS: 600.0000 mg | ORAL_TABLET | Freq: Every day | ORAL | 0 refills | Status: DC | PRN

## 2022-03-03 MED ORDER — TOPIRAMATE 25 MG PO TABS: ORAL_TABLET | ORAL | 5 refills | Status: DC

## 2022-03-03 NOTE — Progress Notes (Signed)
    SUBJECTIVE:   CHIEF COMPLAINT / HPI: uterine bleeding  TD is a 36yo F w/ hx of fibroids that p/w continued abnormal uterine bleeding. Pt reports that she had Mirena IUD placed in 01/2021 for heavy periods. Pt has continued daily uterine bleeding. [place] 10/2021 showed fibroids. Pt had visit with our clinic and OBGYN for IUD removal, but was unsuccessful due to strings not able to be visualized. Now her bleeding is daily. She uses 3-7 pads per day, depending on flow. Sometimes she will have extra heavy bleeding that lasts for a few hrs. She also continues to have periods and bleeding will be heavier during those times. They occur monthly, but at variable times of the month. She reports her periods used to be more regular. Periods will last 8 days. LMP 2 wks ago. She reports megace has decreased bleeding, but not stopped it. She has another OBGYN in August to discuss fibroid tx and IUD removal.   PERTINENT  PMH / PSH: Fibroid, Mirena IUD placed 01/2021  OBJECTIVE:   BP (!) 115/96   Pulse 94   Wt 182 lb 9.6 oz (82.8 kg)   SpO2 100%   BMI 33.40 kg/m   Gen: Friendly woman sitting comfortably in chair, accompanied by child daughter Pulm: Regular WOB, CTAB CV: RRR Abm: Nontender, nondistended, soft  ASSESSMENT/PLAN:   Abnormal uterine bleeding (AUB) Reports continued daily AUB requiring 3-7pads a day. LMP 2wks ago. Mirena IUD (placed 01/2021) was unable to be removed OBGYN. Megace decreased flow, but not stopped. Has appt with OBGYN in August to talk about fibroid tx and IUD removal. Hgb slightly downtrended to 10.8. - Increase Megace '40mg'$  BID to '40mg'$  TID for 7 days. Then resume '40mg'$  BID. - Cont Fe supplement - OBGYN appt in August   Arlyce Dice, [place]

## 2022-03-03 NOTE — Patient Instructions (Signed)
Good to see you today - Thank you for coming in!  Things we discussed today:  For your uterine bleeding, I recommend increasing your Megace to 3(three) times a day for 7 days. After 7 days, go back to taking it 2(two) times a day.

## 2022-03-03 NOTE — Assessment & Plan Note (Signed)
Reports continued daily AUB requiring 3-7pads a day. LMP 2wks ago. Mirena IUD (placed 01/2021) was unable to be removed OBGYN. Megace decreased flow, but not stopped. Has appt with OBGYN in August to talk about fibroid tx and IUD removal. Hgb slightly downtrended to 10.8. - Increase Megace '40mg'$  BID to '40mg'$  TID for 7 days. Then resume '40mg'$  BID. - Cont Fe supplement - OBGYN appt in August

## 2022-03-31 ENCOUNTER — Other Ambulatory Visit: Payer: Self-pay

## 2022-03-31 DIAGNOSIS — N939 Abnormal uterine and vaginal bleeding, unspecified: Secondary | ICD-10-CM

## 2022-03-31 NOTE — Telephone Encounter (Addendum)
Refill request sent for Megace. Pt picked up last remaining refill on 03/24/22. Encounter routed to Ilda Basset, MD for approval/denial.

## 2022-04-05 MED ORDER — MEGESTROL ACETATE 40 MG PO TABS: 40.0000 mg | ORAL_TABLET | Freq: Two times a day (BID) | ORAL | 1 refills | Status: DC

## 2022-04-06 NOTE — Telephone Encounter (Signed)
Refill approved by Ilda Basset, MD. Called patient to notify her. VM left stating medication has been sent.

## 2022-04-12 ENCOUNTER — Ambulatory Visit (INDEPENDENT_AMBULATORY_CARE_PROVIDER_SITE_OTHER): Payer: Medicaid Other | Admitting: Obstetrics & Gynecology

## 2022-04-12 ENCOUNTER — Encounter (HOSPITAL_BASED_OUTPATIENT_CLINIC_OR_DEPARTMENT_OTHER): Payer: Self-pay | Admitting: Obstetrics & Gynecology

## 2022-04-12 VITALS — BP 155/95 | HR 85 | Ht 62.0 in | Wt 183.4 lb

## 2022-04-12 DIAGNOSIS — T8332XD Displacement of intrauterine contraceptive device, subsequent encounter: Secondary | ICD-10-CM

## 2022-04-12 DIAGNOSIS — N939 Abnormal uterine and vaginal bleeding, unspecified: Secondary | ICD-10-CM

## 2022-04-12 DIAGNOSIS — D251 Intramural leiomyoma of uterus: Secondary | ICD-10-CM | POA: Diagnosis not present

## 2022-04-12 DIAGNOSIS — T8332XA Displacement of intrauterine contraceptive device, initial encounter: Secondary | ICD-10-CM | POA: Diagnosis not present

## 2022-04-12 DIAGNOSIS — N946 Dysmenorrhea, unspecified: Secondary | ICD-10-CM | POA: Diagnosis not present

## 2022-04-12 DIAGNOSIS — D500 Iron deficiency anemia secondary to blood loss (chronic): Secondary | ICD-10-CM

## 2022-04-15 NOTE — Progress Notes (Signed)
GYNECOLOGY  VISIT  CC:   Severe uterine cramping, fibroids, bleeding, IUD strings   HPI: 36 y.o. [id number] Divorced Black or [place] American female here for discussion of possible surgical treatment for fibroids and for removal of IUD.  Pt had IUD placed for contraception but has significant cramping when on cycles.  Bleeding is not well controlled with IUD either.  Several attempts at IUD removal have been attempted without success.  Pt does not want to try again in the office.  She would also like fibroids treated if possible.  Does not want hysterectomy at this time.  Has undergone BTL.  Would consider having tubal reversal done in the future if possible.  For now, desires treatment that would help with fibroids, bleeding, pain and would remove IUD.  Pt has undergone ultrasound in 10/2021 showing uterus measuring 12.0 x 7.2 x 7.7 cm  with mass in the posterior aspect of the uterine body measures 5.5 x 4.7 x 5.9 cm and a mass in the uterine fundus measures 2.6 x 1.9 x 2.4 cm. These are most consistent with fibroids.    Last pap smear 02/2019 and was neg with neg HR HPV.  Has seen Dr. Ilda Basset.  Possible Sonata treatment was discussed.  Reviewed in more detail with pt today.  She is amenable to this for treatment at this would be outpatient with quicker recovery, maintain uterus but hopefully help pain and bleeding.  She is currently taking iron and on megace.  Pt would like IUD removed as same time as well.   Past Medical History:  Diagnosis Date   Anemia    Headache    history of migraines   HSV (herpes simplex virus) infection    Hx of chlamydia infection    Hx of gonorrhea     MEDS:   Current Outpatient Medications on File Prior to Visit  Medication Sig Dispense Refill   albuterol (VENTOLIN HFA) 108 (90 Base) MCG/ACT inhaler INHALE 1 TO 2 PUFFS INTO THE LUNGS EVERY 6 HOURS AS NEEDED FOR WHEEZING OR SHORTNESS OF BREATH Strength: 108 (90 Base) MCG/ACT 8 g 1   calcium carbonate (TUMS -  DOSED IN MG ELEMENTAL CALCIUM) 500 MG chewable tablet Chew 1-2 tablets by mouth 2 (two) times daily.     ferrous sulfate 325 (65 FE) MG tablet Take 1 tablet (325 mg total) by mouth every other day. 30 tablet 0   ibuprofen (ADVIL) 600 MG tablet Take 1 tablet (600 mg total) by mouth daily as needed for cramping. 30 tablet 0   megestrol (MEGACE) 40 MG tablet Take 1 tablet (40 mg total) by mouth 2 (two) times daily. 30 tablet 1   ondansetron (ZOFRAN) 4 MG tablet Take 1 tablet (4 mg total) by mouth every 8 (eight) hours as needed for nausea or vomiting. 20 tablet 0   rizatriptan (MAXALT) 10 MG tablet Take 1 tablet (10 mg total) by mouth as needed for migraine (May repeat after 2 hours.  Maximum 2 tablets in 24 hours.). May repeat in 2 hours if needed 10 tablet 5   topiramate (TOPAMAX) 25 MG tablet TAKE 1 TABLET BY MOUTH AT BEDTIME FOR 1 WEEK, THEN INCREASE TO 2 TABLETS AT BEDTIME 60 tablet 5   No current facility-administered medications on file prior to visit.    ALLERGIES: Coconut flavor, Other, and Tomato  SH:  divorced, non smoker  Review of Systems  Constitutional: Negative.   Genitourinary:        Irregular bleeding, pain  with menses    PHYSICAL EXAMINATION:    BP (!) 155/95 (BP Location: Left Arm, Patient Position: Sitting, Cuff Size: Large)   Pulse 85   Ht '5\' 2"'$  (1.575 m) Comment: Reported  Wt 183 lb 6.4 oz (83.2 kg)   BMI 33.54 kg/m     General appearance: alert, cooperative and appears stated age No other exam performed today   Assessment/Plan: 1. Intramural leiomyoma of uterus - will proceed with prior authorization of Sonata procedure  2. Dysmenorrhea  3. Intrauterine contraceptive device threads lost, subsequent encounter - plan removal in OR at same time as Sonata  4. Abnormal uterine bleeding (AUB) - Continue megace  5. Iron deficiency anemia due to chronic blood loss - Continue iron  Total time with pt and documentation:  34 minutes

## 2022-04-28 ENCOUNTER — Ambulatory Visit (INDEPENDENT_AMBULATORY_CARE_PROVIDER_SITE_OTHER): Payer: Medicaid Other | Admitting: Family Medicine

## 2022-04-28 VITALS — BP 126/96 | HR 78 | Ht 62.0 in | Wt 184.6 lb

## 2022-04-28 DIAGNOSIS — M545 Low back pain, unspecified: Secondary | ICD-10-CM

## 2022-04-28 MED ORDER — NAPROXEN 500 MG PO TBEC: 500.0000 mg | DELAYED_RELEASE_TABLET | Freq: Two times a day (BID) | ORAL | 0 refills | Status: DC

## 2022-04-28 NOTE — Progress Notes (Signed)
    SUBJECTIVE:   CHIEF COMPLAINT / HPI:  Chief Complaint  Patient presents with   Leg Pain    Patient started having acute onset lower back pain radiating down bilateral thighs after she bent over. 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 Tried ibuprofen, not much relief. Denies numbness, tingling, bowel/bladder incontinence, saddle anesthesia.  Seen for lumbar strain earlier this year.  PERTINENT  PMH / PSH: AUB, fibroids  Patient Care Team: Colletta Maryland, MD as PCP - General (Family Medicine)   OBJECTIVE:   BP (!) 126/96   Pulse 78   Ht '5\' 2"'$  (1.575 m)   Wt 184 lb 9.6 oz (83.7 kg)   SpO2 99%   BMI 33.76 kg/m   Physical Exam Constitutional:      General: She is not in acute distress. Cardiovascular:     Rate and Rhythm: Normal rate and regular rhythm.  Pulmonary:     Effort: Pulmonary effort is normal. No respiratory distress.     Breath sounds: Normal breath sounds.  Musculoskeletal:     Cervical back: Neck supple.     Comments: Diffusely tender along the midline lower spine and paraspinal muscles.  Flexion limited by pain.  Also has some discomfort with extension and lateral flexion.  Straight leg raise bilaterally with flexion to 30 degrees.  Neurological:     Mental Status: She is alert.     Comments: Full strength in lower extremities         04/28/2022    2:57 PM  Depression screen PHQ 2/9  Decreased Interest 1  Down, Depressed, Hopeless 1  PHQ - 2 Score 2  Altered sleeping 2  Tired, decreased energy 1  Change in appetite 1  Feeling bad or failure about yourself  0  Trouble concentrating 0  Moving slowly or fidgety/restless 0  Suicidal thoughts 0  PHQ-9 Score 6  Difficult doing work/chores Not difficult at all     {Show previous vital signs (optional):23777}    ASSESSMENT/PLAN:   Lumbar strain Likely muscular strain from heavy lifting.  Possible component of sciatica.  No red flags. - naproxen 500 mg BID x 7d,  then BID prn - acetaminophen - work note provided, can return to work 9/11  Return if symptoms worsen or fail to improve.   Zola Button, MD [place]

## 2022-04-28 NOTE — Patient Instructions (Addendum)
It was nice seeing you today!  Take naproxen twice a day for 7 days, then twice a day as needed. You can take Tylenol as needed with this.  Stay well, Zola Button, MD [place] [phone]  --  Make sure to check out at the front desk before you leave today.  Please arrive at least 15 minutes prior to your scheduled appointments.  If you had blood work today, I will send you a MyChart message or a letter if results are normal. Otherwise, I will give you a call.  If you had a referral placed, they will call you to set up an appointment. Please give [place] a call if you don't hear back in the next 2 weeks.  If you need additional refills before your next appointment, please call your pharmacy first.

## 2022-05-17 ENCOUNTER — Encounter: Payer: Self-pay | Admitting: Student

## 2022-05-17 ENCOUNTER — Encounter (HOSPITAL_COMMUNITY): Payer: Self-pay

## 2022-05-17 ENCOUNTER — Ambulatory Visit (INDEPENDENT_AMBULATORY_CARE_PROVIDER_SITE_OTHER): Payer: Medicaid Other | Admitting: Student

## 2022-05-17 ENCOUNTER — Emergency Department (HOSPITAL_COMMUNITY)
Admission: EM | Admit: 2022-05-17 | Discharge: 2022-05-17 | Discharge status: Against Medical Advice | Payer: Medicaid Other | Attending: Emergency Medicine | Admitting: Emergency Medicine

## 2022-05-17 VITALS — BP 138/84 | HR 87 | Temp 98.1°F | Ht 62.0 in | Wt 179.4 lb

## 2022-05-17 DIAGNOSIS — R1110 Vomiting, unspecified: Secondary | ICD-10-CM | POA: Insufficient documentation

## 2022-05-17 DIAGNOSIS — R197 Diarrhea, unspecified: Secondary | ICD-10-CM | POA: Diagnosis not present

## 2022-05-17 DIAGNOSIS — Z20822 Contact with and (suspected) exposure to covid-19: Secondary | ICD-10-CM | POA: Diagnosis not present

## 2022-05-17 DIAGNOSIS — G43101 Migraine with aura, not intractable, with status migrainosus: Secondary | ICD-10-CM | POA: Diagnosis not present

## 2022-05-17 DIAGNOSIS — R519 Headache, unspecified: Secondary | ICD-10-CM | POA: Insufficient documentation

## 2022-05-17 DIAGNOSIS — Z5321 Procedure and treatment not carried out due to patient leaving prior to being seen by health care provider: Secondary | ICD-10-CM | POA: Diagnosis not present

## 2022-05-17 DIAGNOSIS — R198 Other specified symptoms and signs involving the digestive system and abdomen: Secondary | ICD-10-CM

## 2022-05-17 DIAGNOSIS — R509 Fever, unspecified: Secondary | ICD-10-CM | POA: Insufficient documentation

## 2022-05-17 DIAGNOSIS — Z23 Encounter for immunization: Secondary | ICD-10-CM | POA: Diagnosis not present

## 2022-05-17 DIAGNOSIS — R112 Nausea with vomiting, unspecified: Secondary | ICD-10-CM | POA: Diagnosis not present

## 2022-05-17 LAB — RESP PANEL BY RT-PCR (FLU A&B, COVID) ARPGX2
Influenza A by PCR: NEGATIVE
Influenza B by PCR: NEGATIVE
SARS Coronavirus 2 by RT PCR: NEGATIVE

## 2022-05-17 LAB — COMPREHENSIVE METABOLIC PANEL
ALT: 14 U/L (ref 0–44)
AST: 17 U/L (ref 15–41)
Albumin: 4.1 g/dL (ref 3.5–5.0)
Alkaline Phosphatase: 40 U/L (ref 38–126)
Anion gap: 7 (ref 5–15)
BUN: 13 mg/dL (ref 6–20)
CO2: 21 mmol/L — ABNORMAL LOW (ref 22–32)
Calcium: 9.1 mg/dL (ref 8.9–10.3)
Chloride: 105 mmol/L (ref 98–111)
Creatinine, Ser: 0.5 mg/dL (ref 0.44–1.00)
GFR, Estimated: >60 mL/min (ref >60)
Glucose, Bld: 96 mg/dL (ref 70–99)
Potassium: 3.4 mmol/L — ABNORMAL LOW (ref 3.5–5.1)
Sodium: 133 mmol/L — ABNORMAL LOW (ref 135–145)
Total Bilirubin: 1.1 mg/dL (ref 0.3–1.2)
Total Protein: 7.7 g/dL (ref 6.5–8.1)

## 2022-05-17 LAB — CBC
HCT: 40.1 % (ref 36.0–46.0)
Hemoglobin: 13.6 g/dL (ref 12.0–15.0)
MCH: 28.9 pg (ref 26.0–34.0)
MCHC: 33.9 g/dL (ref 30.0–36.0)
MCV: 85.3 fL (ref 80.0–100.0)
Platelets: 291 10*3/uL (ref 150–400)
RBC: 4.7 MIL/uL (ref 3.87–5.11)
RDW: 12.5 % (ref 11.5–15.5)
WBC: 5.4 10*3/uL (ref 4.0–10.5)
nRBC: 0 % (ref 0.0–0.2)

## 2022-05-17 LAB — URINALYSIS, ROUTINE W REFLEX MICROSCOPIC
Bilirubin Urine: NEGATIVE
Glucose, UA: NEGATIVE mg/dL
Hgb urine dipstick: NEGATIVE
Ketones, ur: NEGATIVE mg/dL
Leukocytes,Ua: NEGATIVE
Nitrite: NEGATIVE
Protein, ur: NEGATIVE mg/dL
Specific Gravity, Urine: 1.025 (ref 1.005–1.030)
pH: 5 (ref 5.0–8.0)

## 2022-05-17 LAB — I-STAT BETA HCG BLOOD, ED (MC, WL, AP ONLY): I-stat hCG, quantitative: <5 m[IU]/mL (ref <5)

## 2022-05-17 LAB — LIPASE, BLOOD: Lipase: 34 U/L (ref 11–51)

## 2022-05-17 MED ORDER — KETOROLAC TROMETHAMINE 30 MG/ML IJ SOLN
15.0000 mg | Freq: Once | INTRAMUSCULAR | Status: AC
  Administered 2022-05-17: 15 mg via INTRAMUSCULAR

## 2022-05-17 MED ORDER — KETOROLAC TROMETHAMINE 15 MG/ML IJ SOLN: 15.0000 mg | Freq: Once | INTRAMUSCULAR | Status: DC

## 2022-05-17 NOTE — Addendum Note (Signed)
Addended by: Talbot Grumbling on: 05/17/2022 03:06 PM   Modules accepted: Orders

## 2022-05-17 NOTE — Progress Notes (Signed)
  SUBJECTIVE:   CHIEF COMPLAINT / HPI:   Headaches and vomiting  GI illness Yesterday was having vomiting, but that has slowed down, threw up 4 times. Also appreciates a fever to 100 and a migraine.  Symptoms started early yesterday morning. Reports she woke up and threw up. Mom has also been sick at home with similar symptoms. Also reports some SOB and lightheadedness. Also took ibuprofen for fever. Also reports a 3 episodes of diarrhea yesterday and continued today.  Headache: Reports migraine starting yesterday. Has tried the migraine medicine (triptan and topomax) but its' not working. Reports loud sounds and lights are botherinig head. Pain rated 9-10/10 HA located on left side of head, extending to other side and all the way down back of head. Has been throwing up. Reports getting migraines nearly daily. Last saw neurologist 5-6 months ago.   Also notes it feels like room is spinning when she closses eyes, causing her to feel dizzy.   PERTINENT  PMH / PSH: migraines  OBJECTIVE:  BP 138/84   Pulse 87   Temp 98.1 F (36.7 C)   Ht '5\' 2"'$  (1.575 m)   Wt 179 lb 6.4 oz (81.4 kg)   SpO2 99%   BMI 32.81 kg/m   General: NAD, pleasant, able to participate in exam Cardiac: RRR, no murmurs auscultated. Respiratory: CTAB, normal effort, no wheezes, rales or rhonchi Abdomen: soft, non-tender, non-distended, normoactive bowel sounds Extremities: warm and well perfused, no edema or cyanosis, cap refill < 2 sec Skin: warm and dry, no rashes noted Neuro: alert, no obvious focal deficits, speech normal, PERRL, EOMI, strength 5/5 Psych: Normal affect and mood  ASSESSMENT/PLAN:  Migraine with aura and with status migrainosus, not intractable Patient reports migraine almost daily, but this one started yesterday. Notes that pain located on side of head, and spreading around, with photo and auditory sensitivity. Patient stopped taking topomax, as she doesn't like the way it makes her feel, and  tried her triptan with no relief. Patient follows w/ neurology, but hasn't been seen in 4-6 months since starting topomax. Will recommend patient f/u with neuro and will treat migraine. -Toradol 15 mg shot -Encouraged patient to take Benadryl 25 mg at home.  -F/u with neurologist   GI Illness Patient with symptoms concerning for GI illness. Patient went to ED this am and was COVID/Flu negative. Patient reports vomiting is improved, but still continues to have diarrhea. Patient also reports mother at home had similar symptoms. Patient most likely suffering from GI illness that is self limited. Also considered food poisoning, but less likely given hx. No concern for inflammatory illness at this time, like IBD.   Flu Shot Patient also received her flu shot today  No orders of the defined types were placed in this encounter.  Meds ordered this encounter  Medications   ketorolac (TORADOL) 15 MG/ML injection 15 mg   No follow-ups on file. '@SIGNNOTE'$ @

## 2022-05-17 NOTE — ED Provider Triage Note (Signed)
Emergency Medicine Provider Triage Evaluation Note  TASMIN EXANTUS , a 36 y.o. female  was evaluated in triage.  Pt complains of vomiting, headache, and fever x1 day.  Patient also endorses some diarrhea.  Mother sick with similar symptoms.  No chest pain or shortness of breath.  Review of Systems  Positive: vomiting Negative: CP  Physical Exam  BP (!) 136/102 (BP Location: Left Arm)   Pulse 79   Temp 99 F (37.2 C) (Oral)   Resp 16   SpO2 99%  Gen:   Awake, no distress   Resp:  Normal effort  MSK:   Moves extremities without difficulty  Other:    Medical Decision Making  Medically screening exam initiated at 8:09 AM.  Appropriate orders placed.  Caprice Kluver CORISA MONTINI was informed that the remainder of the evaluation will be completed by another provider, this initial triage assessment does not replace that evaluation, and the importance of remaining in the ED until their evaluation is complete.  Labs COVID test   Suzy Bouchard, [place] 05/17/22 2423

## 2022-05-17 NOTE — ED Triage Notes (Addendum)
Pt arrived via POV, c/o vomiting and headache yesterday, with fever. C/o same today now with diarrhea. States mother has been sick with cold sx as well.

## 2022-05-17 NOTE — Patient Instructions (Signed)
It was great to see you! Thank you for allowing me to participate in your care!  Our plans for today:  - GI illness  This should get better on it's own. I will give you a note witting you out for Monday-Wednesday  - Migraine  Giving Toradol shot  Take some benadryl 25 mg at home   Make appointment to be seen by neurologist  Take care and seek immediate care sooner if you develop any concerns.   Dr. Holley Bouche, MD [place]

## 2022-05-17 NOTE — Assessment & Plan Note (Signed)
Patient reports migraine almost daily, but this one started yesterday. Notes that pain located on side of head, and spreading around, with photo and auditory sensitivity. Patient stopped taking topomax, as she doesn't like the way it makes her feel, and tried her triptan with no relief. Patient follows w/ neurology, but hasn't been seen in 4-6 months since starting topomax. Will recommend patient f/u with neuro and will treat migraine. -Toradol 15 mg shot -Encouraged patient to take Benadryl 25 mg at home.  -F/u with neurologist

## 2022-06-06 ENCOUNTER — Ambulatory Visit (INDEPENDENT_AMBULATORY_CARE_PROVIDER_SITE_OTHER): Payer: Medicaid Other | Admitting: Family Medicine

## 2022-06-06 ENCOUNTER — Encounter: Payer: Self-pay | Admitting: Family Medicine

## 2022-06-06 ENCOUNTER — Other Ambulatory Visit: Payer: Self-pay

## 2022-06-06 VITALS — BP 131/91 | HR 85 | Ht 62.0 in | Wt 182.2 lb

## 2022-06-06 DIAGNOSIS — R5383 Other fatigue: Secondary | ICD-10-CM

## 2022-06-06 DIAGNOSIS — N939 Abnormal uterine and vaginal bleeding, unspecified: Secondary | ICD-10-CM

## 2022-06-06 DIAGNOSIS — N644 Mastodynia: Secondary | ICD-10-CM

## 2022-06-06 DIAGNOSIS — R030 Elevated blood-pressure reading, without diagnosis of hypertension: Secondary | ICD-10-CM

## 2022-06-06 DIAGNOSIS — N938 Other specified abnormal uterine and vaginal bleeding: Secondary | ICD-10-CM

## 2022-06-06 DIAGNOSIS — D259 Leiomyoma of uterus, unspecified: Secondary | ICD-10-CM

## 2022-06-06 DIAGNOSIS — I10 Essential (primary) hypertension: Secondary | ICD-10-CM | POA: Diagnosis not present

## 2022-06-06 DIAGNOSIS — R1030 Lower abdominal pain, unspecified: Secondary | ICD-10-CM | POA: Diagnosis not present

## 2022-06-06 MED ORDER — IBUPROFEN 600 MG PO TABS: 600.0000 mg | ORAL_TABLET | Freq: Four times a day (QID) | ORAL | 0 refills | Status: DC | PRN

## 2022-06-06 MED ORDER — MEGESTROL ACETATE 40 MG PO TABS: 40.0000 mg | ORAL_TABLET | Freq: Two times a day (BID) | ORAL | 0 refills | Status: DC

## 2022-06-06 MED ORDER — BLOOD PRESSURE CUFF MISC: 0 refills | Status: DC

## 2022-06-06 NOTE — Assessment & Plan Note (Signed)
Worsening bleeding associated with pain and fatigue x 2 days. Advised patient to take Megace daily as prescribed by OBGYN. Sent in refill to bridge until OBGYN appt in early Nov 2023. Advised using Ibuprofen '600mg'$  q6h prn for pain management. Sent in refill. Recommended patient discuss surgical options with OGBYN given persistent symptoms.

## 2022-06-06 NOTE — Assessment & Plan Note (Addendum)
Intermittent L breast pain. Exam negative for lump or infectious etiology. Occurs cyclically, related to menstrual cycle. Will not pursue imaging at this time due to likely hormonal source. Continue to monitor.

## 2022-06-06 NOTE — Assessment & Plan Note (Signed)
Check CBC and TSH today.

## 2022-06-06 NOTE — Assessment & Plan Note (Addendum)
BP stable. Sent in prescription to get home BP cuff. Advised patient to keep a home log.

## 2022-06-06 NOTE — Progress Notes (Signed)
    SUBJECTIVE:   CHIEF COMPLAINT / HPI:   BP check: Patient reports higher blood pressure recently at doctor's visits. States she does not check it at home but has been noticing numbers in the 130-140s/90s.  Abnormal uterine bleeding: Patient with h/o abnormal bleeding managed by OBGYN. States 2 days ago the bleeding started to get worse with heavy flow including passing clots. States she had stopped taking the Megace on advice from th OBGYN but she took one dose today for some relief. States she is also having associated abdominal pain and fatigue. Patient states she is out of Megace and Ibuprofen from the OBGYN.  Left breast pain: Patient states she gets periodic pain in her left breast that comes and goes for the past week. States she feels it most about the nipple towards the armpit. States she has had this pain before and it does appear to occur related to the bleeding patterns. States he breast just someone feel tender and sore diffusely.  PERTINENT  PMH / PSH: AUB  OBJECTIVE:   BP (!) 131/91   Pulse 85   Ht '5\' 2"'$  (1.575 m)   Wt 182 lb 3.2 oz (82.6 kg)   SpO2 100%   BMI 33.32 kg/m    General: NAD, pleasant, able to participate in exam Cardiac: RRR, no murmurs. Respiratory: CTAB, normal effort, No wheezes, rales or rhonchi Abdomen: Bowel sounds present, non-distended. Mildly tender diffusely Extremities: no edema or cyanosis. Skin: warm and dry. Breast tissue without erythema, edema or discrete lump. Neuro: alert, no obvious focal deficits Psych: Normal affect and mood  ASSESSMENT/PLAN:   Abnormal uterine bleeding (AUB) Worsening bleeding associated with pain and fatigue x 2 days. Advised patient to take Megace daily as prescribed by OBGYN. Sent in refill to bridge until OBGYN appt in early Nov 2023. Advised using Ibuprofen '600mg'$  q6h prn for pain management. Sent in refill. Recommended patient discuss surgical options with OGBYN given persistent  symptoms.  Fatigue Check CBC and TSH today.   Breast pain Intermittent L breast pain. Exam negative for lump or infectious etiology. Occurs cyclically, related to menstrual cycle. Will not pursue imaging at this time due to likely hormonal source. Continue to monitor.  Elevated BP without diagnosis of hypertension BP stable. Sent in prescription to get home BP cuff. Advised patient to keep a home log.      Dr. Colletta Maryland, [place]

## 2022-06-06 NOTE — Patient Instructions (Signed)
It was wonderful to see you today.  Please bring ALL of your medications with you to every visit.   Today we talked about:  Please take your Megace as prescribed until you are able to see the OBGYN for further evaluation of your bleeding. I refilled this prescription. We are doing some lab work today. I will call you about the results. I sent in a prescription for a blood pressure cuff that will hopefully be covered by your insurance. If using, please check your blood pressure once a day and keep a log.  Please follow up in 3 months  Thank you for choosing [facility].   Please call [phone] with any questions about today's appointment.  Please be sure to schedule follow up at the front desk before you leave today.   Colletta Maryland, DO Family Medicine

## 2022-06-07 LAB — CBC
Hematocrit: 42 % (ref 34.0–46.6)
Hemoglobin: 13.8 g/dL (ref 11.1–15.9)
MCH: 29 pg (ref 26.6–33.0)
MCHC: 32.9 g/dL (ref 31.5–35.7)
MCV: 88 fL (ref 79–97)
Platelets: 313 10*3/uL (ref 150–450)
RBC: 4.76 x10E6/uL (ref 3.77–5.28)
RDW: 12.6 % (ref 11.7–15.4)
WBC: 4.7 10*3/uL (ref 3.4–10.8)

## 2022-06-07 LAB — TSH RFX ON ABNORMAL TO FREE T4: TSH: 0.532 u[IU]/mL (ref 0.450–4.500)
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
## 2022-06-15 ENCOUNTER — Ambulatory Visit (INDEPENDENT_AMBULATORY_CARE_PROVIDER_SITE_OTHER): Payer: Medicaid Other | Admitting: Neurology

## 2022-06-15 ENCOUNTER — Encounter: Payer: Self-pay | Admitting: Neurology

## 2022-06-15 VITALS — BP 129/75 | HR 69 | Resp 18 | Ht 62.0 in | Wt 182.0 lb

## 2022-06-15 DIAGNOSIS — G43709 Chronic migraine without aura, not intractable, without status migrainosus: Secondary | ICD-10-CM | POA: Diagnosis not present

## 2022-06-15 DIAGNOSIS — G43101 Migraine with aura, not intractable, with status migrainosus: Secondary | ICD-10-CM

## 2022-06-15 MED ORDER — ONDANSETRON 4 MG PO TBDP: 4.0000 mg | ORAL_TABLET | Freq: Three times a day (TID) | ORAL | 5 refills | Status: DC | PRN

## 2022-06-15 MED ORDER — AIMOVIG 140 MG/ML ~~LOC~~ SOAJ: 140.0000 mg | SUBCUTANEOUS | 11 refills | Status: DC

## 2022-06-15 NOTE — Patient Instructions (Signed)
  Start Aimovig '140mg'$  every 28 days Stop ibuprofen.  Take Ubrelvy '100mg'$  at earliest onset of headache.  May repeat dose once in 2 hours if needed.  Maximum 2 tablets in 24 hours.  Ondansetron for nausea Limit use of pain relievers to no more than 2 days out of the week.  These medications include acetaminophen, NSAIDs (ibuprofen/Advil/Motrin, naproxen/Aleve, triptans (Imitrex/sumatriptan), Excedrin, and narcotics.  This will help reduce risk of rebound headaches. Be aware of common food triggers:  - Caffeine:  coffee, black tea, cola, Mt. Dew  - Chocolate  - Dairy:  aged cheeses (brie, blue, cheddar, gouda, [place], provolone, [place], Swiss, etc), chocolate milk, buttermilk, sour cream, limit eggs and yogurt  - Nuts, peanut butter  - Alcohol  - Cereals/grains:  FRESH breads (fresh bagels, sourdough, doughnuts), yeast productions  - Processed/canned/aged/cured meats (pre-packaged deli meats, hotdogs)  - MSG/glutamate:  soy sauce, flavor enhancer, pickled/preserved/marinated foods  - Sweeteners:  aspartame (Equal, Nutrasweet).  Sugar and Splenda are okay  - Vegetables:  legumes (lima beans, lentils, snow peas, fava beans, pinto peans, peas, garbanzo beans), sauerkraut, onions, olives, pickles  - Fruit:  avocados, bananas, citrus fruit (orange, lemon, grapefruit), mango  - Other:  Frozen meals, macaroni and cheese Routine exercise Stay adequately hydrated (aim for 64 oz water daily) Keep headache diary Maintain proper stress management Maintain proper sleep hygiene Do not skip meals Consider supplements:  magnesium citrate '400mg'$  daily, riboflavin '400mg'$  daily, coenzyme Q10 '100mg'$  three times daily. Follow up 4 to 5 months.

## 2022-06-16 ENCOUNTER — Telehealth: Payer: Self-pay | Admitting: Pharmacy Technician

## 2022-06-16 NOTE — Telephone Encounter (Signed)
Patient Advocate Encounter  Prior Authorization for Aimovig '140MG'$ /ML auto-injectors has been approved.    PA# [id number] Key: [id number] Effective dates: 06/16/2022 through 09/14/2022      Lyndel Safe, [place] Patient Advocate Specialist [place] Patient Advocate Team Direct Number: [phone]  Fax: [phone]

## 2022-06-17 ENCOUNTER — Telehealth: Payer: Self-pay | Admitting: Neurology

## 2022-06-17 NOTE — Telephone Encounter (Signed)
Patient was trying [name] and it is working well for her she called to report. FYI only, she said.

## 2022-06-24 ENCOUNTER — Encounter: Payer: Self-pay | Admitting: Neurology

## 2022-06-24 ENCOUNTER — Other Ambulatory Visit: Payer: Self-pay

## 2022-06-24 MED ORDER — UBRELVY 100 MG PO TABS: 100.0000 mg | ORAL_TABLET | ORAL | 5 refills | Status: DC | PRN

## 2022-06-28 ENCOUNTER — Ambulatory Visit (HOSPITAL_BASED_OUTPATIENT_CLINIC_OR_DEPARTMENT_OTHER): Payer: Medicaid Other | Admitting: Obstetrics & Gynecology

## 2022-07-07 ENCOUNTER — Encounter (HOSPITAL_BASED_OUTPATIENT_CLINIC_OR_DEPARTMENT_OTHER): Payer: Self-pay | Admitting: Obstetrics & Gynecology

## 2022-07-07 ENCOUNTER — Other Ambulatory Visit (HOSPITAL_COMMUNITY)
Admission: RE | Admit: 2022-07-07 | Discharge: 2022-07-07 | Disposition: A | Discharge status: Home / Self Care | Payer: Medicaid Other | Source: Ambulatory Visit | Attending: Obstetrics & Gynecology | Admitting: Obstetrics & Gynecology

## 2022-07-07 ENCOUNTER — Ambulatory Visit (INDEPENDENT_AMBULATORY_CARE_PROVIDER_SITE_OTHER): Payer: Medicaid Other | Admitting: Obstetrics & Gynecology

## 2022-07-07 VITALS — BP 130/90 | HR 84 | Ht 62.0 in | Wt 178.6 lb

## 2022-07-07 DIAGNOSIS — Z124 Encounter for screening for malignant neoplasm of cervix: Secondary | ICD-10-CM | POA: Insufficient documentation

## 2022-07-07 DIAGNOSIS — N939 Abnormal uterine and vaginal bleeding, unspecified: Secondary | ICD-10-CM | POA: Diagnosis not present

## 2022-07-07 MED ORDER — MEGESTROL ACETATE 40 MG PO TABS: 40.0000 mg | ORAL_TABLET | Freq: Two times a day (BID) | ORAL | 5 refills | Status: DC

## 2022-07-11 LAB — CYTOLOGY - PAP
Adequacy: ABSENT
Comment: NEGATIVE
Diagnosis: NEGATIVE
High risk HPV: NEGATIVE

## 2022-07-14 NOTE — Progress Notes (Addendum)
GYNECOLOGY  VISIT  CC:   discuss fibroid treatment  HPI: 36 y.o. [id number] Divorced Black or [place] American female here for discussion of fibroid treatment.  She is here with her mother.  Uterus is 12 x 7 x 7cm with a 5.9cm posterior fibroid and a 2.6cm additional fibroid.  She does have heavy bleeding.  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  She is back to discuss options.  She really does not a hysterectomy at this time.  Has hx of BTL but not ready for such definitive surgery.  Myomectomy and [place] discussed.  Pt would like to proceed with myomectomy.  Discussed timing of surgery which will likely be a few months.  Incision locations, risks reviewed including bleeding, infection, possible growth of future fibroids.  Possible need for cesarean section if was pregnant in the future.  Again, h/o BTL but pt is chosing uterine conserving treatment so is aware of this possibility.  Day surgery vs overnight stay discussed.  Questions answered.  She is still on megace which is controlling bleeding.  Last hb was 13.6.  Does need megace refill.  Also, pap is due.  Pt willing to do this today.     Past Medical History:  Diagnosis Date   Anemia    Headache    history of migraines   HSV (herpes simplex virus) infection    Hx of chlamydia infection    Hx of gonorrhea     MEDS:   Current Outpatient Medications on File Prior to Visit  Medication Sig Dispense Refill   albuterol (VENTOLIN HFA) 108 (90 Base) MCG/ACT inhaler INHALE 1 TO 2 PUFFS INTO THE LUNGS EVERY 6 HOURS AS NEEDED FOR WHEEZING OR SHORTNESS OF BREATH Strength: 108 (90 Base) MCG/ACT 8 g 1   calcium carbonate (TUMS - DOSED IN MG ELEMENTAL CALCIUM) 500 MG chewable tablet Chew 1-2 tablets by mouth 2 (two) times daily.     Erenumab-aooe (AIMOVIG) 140 MG/ML SOAJ Inject 140 mg into the skin every 28 (twenty-eight) days. 1.12 mL 11   ibuprofen (ADVIL) 600 MG tablet Take 1 tablet (600 mg total) by mouth  every 6 (six) hours as needed for cramping. 60 tablet 0   naproxen (EC-NAPROXEN) 500 MG EC tablet Take 1 tablet (500 mg total) by mouth 2 (two) times daily with a meal. 60 tablet 0   ondansetron (ZOFRAN-ODT) 4 MG disintegrating tablet Take 1 tablet (4 mg total) by mouth every 8 (eight) hours as needed for nausea or vomiting. 20 tablet 5   Ubrogepant (UBRELVY) 100 MG TABS Take 100 mg by mouth as needed (take 1 tab at the earlist onset of a Migraine, May repeat in 2 hour. Max 2 tabs in 24 hours). 16 tablet 5   ferrous sulfate 325 (65 FE) MG tablet Take 1 tablet (325 mg total) by mouth every other day. (Patient not taking: Reported on 06/15/2022) 30 tablet 0   No current facility-administered medications on file prior to visit.    ALLERGIES: Coconut flavor, Other, and Tomato  SH:  divorced, non smoker  ROS  PHYSICAL EXAMINATION:    BP (!) 130/90 (BP Location: Right Arm, Patient Position: Sitting, Cuff Size: Large)   Pulse 84   Ht '5\' 2"'$  (1.575 m) Comment: Reported  Wt 178 lb 9.6 oz (81 kg)   BMI 32.67 kg/m     General appearance: alert, cooperative and appears stated age Abdomen: soft, non-tender; bowel sounds normal; no masses,  no  organomegaly Lymph:  no inguinal LAD noted  Pelvic: External genitalia:  no lesions              Urethra:  normal appearing urethra with no masses, tenderness or lesions              Bartholins and Skenes: normal                 Vagina: normal appearing vagina with normal color and discharge, no lesions              Cervix: no lesions              Bimanual Exam:  Uterus:  enlarged, 10-12 weeks size, mobile              Adnexa: no mass, fullness, tenderness  Chaperone, Octaviano Batty, CMA, was present for exam.  Assessment/Plan: 1. Cervical cancer screening - Cytology - PAP( [facility])  2. Abnormal uterine bleeding - will proceed with surgical planning at this time - megestrol (MEGACE) 40 MG tablet; Take 1 tablet (40 mg total) by mouth 2 (two) times  daily.  Dispense: 60 tablet; Refill: 5  3.  IUD strings lost - will plan to remove with hysteroscopy day of surgery as well.  Pt will transition to Depo provera if needs any hormonal therapy.

## 2022-07-21 ENCOUNTER — Telehealth: Payer: Self-pay | Admitting: Neurology

## 2022-07-21 ENCOUNTER — Telehealth: Payer: Self-pay

## 2022-07-21 ENCOUNTER — Encounter: Payer: Self-pay | Admitting: Neurology

## 2022-07-21 NOTE — Telephone Encounter (Signed)
Per Patient Julia Kehr PA needed.   PA team please start a PA for urbelvy 100 mg as needed

## 2022-07-21 NOTE — Telephone Encounter (Signed)
Patient needs a refill on the the [place], she states that [employer] needs a request

## 2022-07-22 ENCOUNTER — Encounter (HOSPITAL_BASED_OUTPATIENT_CLINIC_OR_DEPARTMENT_OTHER): Payer: Self-pay

## 2022-07-22 NOTE — Telephone Encounter (Signed)
Pt called in to see if she could get some samples of the Ubrelvy until the PA is approved?

## 2022-07-22 NOTE — Telephone Encounter (Signed)
Refills sent 06/24/22. Patient advised by [employer]. Message also sent to DR.Jaffe to ask for samples to give the patient until her PA is finished.

## 2022-07-22 NOTE — Telephone Encounter (Signed)
Patient recevied Samples of urbelvy today.

## 2022-07-25 ENCOUNTER — Other Ambulatory Visit (HOSPITAL_COMMUNITY)
Admission: RE | Admit: 2022-07-25 | Discharge: 2022-07-25 | Disposition: A | Discharge status: Home / Self Care | Payer: Medicaid Other | Source: Ambulatory Visit | Attending: Family Medicine | Admitting: Family Medicine

## 2022-07-25 ENCOUNTER — Ambulatory Visit (INDEPENDENT_AMBULATORY_CARE_PROVIDER_SITE_OTHER): Payer: Medicaid Other | Admitting: Family Medicine

## 2022-07-25 ENCOUNTER — Other Ambulatory Visit: Payer: Self-pay | Admitting: Family Medicine

## 2022-07-25 VITALS — BP 141/97 | HR 78 | Ht 62.0 in | Wt 181.6 lb

## 2022-07-25 DIAGNOSIS — N949 Unspecified condition associated with female genital organs and menstrual cycle: Secondary | ICD-10-CM

## 2022-07-25 DIAGNOSIS — N649 Disorder of breast, unspecified: Secondary | ICD-10-CM

## 2022-07-25 DIAGNOSIS — N9089 Other specified noninflammatory disorders of vulva and perineum: Secondary | ICD-10-CM | POA: Diagnosis not present

## 2022-07-25 LAB — POCT WET PREP (WET MOUNT)
Clue Cells Wet Prep Whiff POC: NEGATIVE
Trichomonas Wet Prep HPF POC: ABSENT

## 2022-07-25 MED ORDER — NYSTATIN 100000 UNIT/GM EX CREA: 1.0000 | TOPICAL_CREAM | Freq: Two times a day (BID) | CUTANEOUS | 0 refills | Status: DC

## 2022-07-25 NOTE — Assessment & Plan Note (Signed)
Ingrown hair versus herpes outbreak.  Too far from vulva to be Bartholin cyst.  Patient amenable to routine STI swab for gonorrhea, chlamydia, and trichomonas.  Does have history of herpes as detected on initial prenatal's in 2018, not on suppression therapy.  Wet prep showed many bacteria, otherwise negative.  No indications for antibiotics at this time.  Will await STI screening.

## 2022-07-25 NOTE — Progress Notes (Signed)
    SUBJECTIVE:   CHIEF COMPLAINT / HPI:   Vulvar and breast complaint [name] is a 36 year old woman who presents today with her mother for vulvar and breast complaints.  She reports approximately 4 days of external discomfort over the vulva and perineum with wiping.  Some abnormal discharge and odor.  No dysuria, suprapubic pain, flank pain, or gross hematuria.  No specific concerns for STI exposures.  She also reports a pruritic rash on her left areola for the last couple of months.  She reports it has not spread anywhere and only involves the left areola.  No other skin areas involved.  No nipple discharge, discharge from the pruritic lesion, or underlying breast lumps appreciated by herself.   PERTINENT  PMH / PSH:  Patient Active Problem List   Diagnosis Date Noted   Vulvar lesion 07/25/2022   Breast lesion 07/25/2022   Breast pain 06/06/2022   Elevated BP without diagnosis of hypertension 06/06/2022   IUD threads lost 02/14/2022   Abnormal uterine bleeding (AUB) 02/14/2022   Fibroid, uterine 12/16/2021   Strain of lumbar region 09/23/2021   Fatigue 09/23/2021   Encounter for insertion of mirena IUD 02/16/2021   Depressed mood 01/22/2021   NSAID long-term use 01/22/2021   Menorrhagia with regular cycle 01/22/2021   Migraine with aura and with status migrainosus, not intractable 01/22/2021   Generalized anxiety disorder with panic attacks 06/19/2020   Anxiety 07/22/2019   HSV (herpes simplex virus) infection 03/12/2019    OBJECTIVE:   BP (!) 141/97   Pulse 78   Ht '5\' 2"'$  (1.575 m)   Wt 181 lb 9.6 oz (82.4 kg)   SpO2 100%   BMI 33.22 kg/m    Physical Exam General: Awake, alert, oriented, no acute distress Respiratory: Normal work of breathing, no respiratory distress Breast: [place], 2 cm scaly hyperpigmented plaque of the left areola in the superior lateral area, nontender to touch, without drainage or exudate, left nipple without discharge, left areola without  any suspicious nodules to palpation Neuro: Cranial nerves II through X grossly intact, able to move all extremities spontaneously Vulva: Normal appearing vulva, single erythematous raised lesion of left medial buttock near perineum, without, no or obvious discharge, no surrounding erythema Vagina: Pale pink rugated vaginal tissue without obvious lesions, physiologic discharge of whitish color, cervix without lesion or overt tenderness with swab  Sensitive exam performed with chaperone in the room:  [name], CMA  ASSESSMENT/PLAN:   Vulvar lesion Ingrown hair versus herpes outbreak.  Too far from vulva to be Bartholin cyst.  Patient amenable to routine STI swab for gonorrhea, chlamydia, and trichomonas.  Does have history of herpes as detected on initial prenatal's in 2018, not on suppression therapy.  Wet prep showed many bacteria, otherwise negative.  No indications for antibiotics at this time.  Will await STI screening.  Breast lesion Unclear eruption, but morphologically looks fungal.  This is curious given singular location without spread.  Rx nystatin ointment for trial.  Will send for diagnostic mammogram to rule out nodules.     Ezequiel Essex, MD [place]

## 2022-07-25 NOTE — Assessment & Plan Note (Signed)
Unclear eruption, but morphologically looks fungal.  This is curious given singular location without spread.  Rx nystatin ointment for trial.  Will send for diagnostic mammogram to rule out nodules.

## 2022-07-25 NOTE — Patient Instructions (Signed)
It was wonderful to see you today. Thank you for allowing me to be a part of your care. Below is a short summary of what we discussed at your visit today:  Vaginal complaint I saw a red bump in the perineum that could be the source of irritation.  We also tested for common sexually-transmitted infections including gonorrhea, chlamydia, trichomonas.  I added on herpes to make sure the red bump was not a herpes outbreak.  Use barrier cream over the area, and give zinc oxide or thick diaper cream.  I will call you with the results of the wet prep in clinic.  Breast complaint Use the nystatin ointment over the area as instructed until it improves.  I also want you to go get a mammogram to make sure the breast is okay.  See below.      Please bring all of your medications to every appointment!  If you have any questions or concerns, please do not hesitate to contact us via phone or MyChart message.   Ezequiel Essex, MD

## 2022-07-26 ENCOUNTER — Telehealth: Payer: Self-pay

## 2022-07-26 ENCOUNTER — Other Ambulatory Visit (HOSPITAL_COMMUNITY): Payer: Self-pay

## 2022-07-26 NOTE — Telephone Encounter (Signed)
Patient Advocate Encounter   Received notification from [facility] that prior authorization is required for Ubrelvy '100MG'$  tablets  Submitted: 07-26-2022 Key [id number]   Status is pending

## 2022-07-27 LAB — CERVICOVAGINAL ANCILLARY ONLY
Chlamydia: NEGATIVE
Comment: NEGATIVE
Comment: NEGATIVE
Comment: NORMAL
Neisseria Gonorrhea: NEGATIVE
Trichomonas: NEGATIVE

## 2022-08-01 MED ORDER — ELETRIPTAN HYDROBROMIDE 40 MG PO TABS: 40.0000 mg | ORAL_TABLET | ORAL | 5 refills | Status: DC | PRN

## 2022-08-01 NOTE — Telephone Encounter (Signed)
Patient Advocate Encounter  Received a fax from [employer] regarding Prior Authorization for [employer] '100mg'$  tablets.   Authorization has been DENIED due to we did not see certain details about your use and treatment. We see that this is a request for a drug called Ubrelvy tablet for your use (migraine with or without aura). We may consider approval of this drug when used in a certain situation (when you have had a headache frequency of less than 15 headache days per month during the prior 6 months). We did not see information that shows this applies to you.   Determination letter attached to patient chart.

## 2022-08-01 NOTE — Telephone Encounter (Signed)
Patient advised of Dr.Jaffe note,  Her insurance will just not approve the Ubrelvy at this time.  I would like to try her on eletriptan '40mg'$ .  If she is agreeable, please send in prescription for eletriptan '40mg'$  as needed.  May repeat after 2 hours.  Maximum 2 tablets in 24 hours.  Refills 5  Eletriptan 40 mg sent to [facility] as requested.

## 2022-08-10 ENCOUNTER — Other Ambulatory Visit: Payer: Medicaid Other

## 2022-08-23 ENCOUNTER — Other Ambulatory Visit: Payer: Self-pay | Admitting: Family Medicine

## 2022-08-23 DIAGNOSIS — N649 Disorder of breast, unspecified: Secondary | ICD-10-CM

## 2022-08-29 ENCOUNTER — Encounter: Payer: Self-pay | Admitting: Student

## 2022-08-29 ENCOUNTER — Ambulatory Visit (INDEPENDENT_AMBULATORY_CARE_PROVIDER_SITE_OTHER): Payer: Medicaid Other | Admitting: Student

## 2022-08-29 ENCOUNTER — Other Ambulatory Visit: Payer: Medicaid Other

## 2022-08-29 VITALS — BP 138/105 | HR 81 | Temp 98.2°F | Wt 181.1 lb

## 2022-08-29 DIAGNOSIS — D259 Leiomyoma of uterus, unspecified: Secondary | ICD-10-CM | POA: Diagnosis not present

## 2022-08-29 DIAGNOSIS — J069 Acute upper respiratory infection, unspecified: Secondary | ICD-10-CM | POA: Diagnosis not present

## 2022-08-29 DIAGNOSIS — U071 COVID-19: Secondary | ICD-10-CM

## 2022-08-29 LAB — POC SOFIA 2 FLU + SARS ANTIGEN FIA
Influenza A, POC: NEGATIVE
Influenza B, POC: NEGATIVE
SARS Coronavirus 2 Ag: POSITIVE — AB

## 2022-08-29 MED ORDER — ONDANSETRON 4 MG PO TBDP: 4.0000 mg | ORAL_TABLET | Freq: Three times a day (TID) | ORAL | 0 refills | Status: DC | PRN

## 2022-08-29 MED ORDER — PAXLOVID (300/100) 20 X 150 MG & 10 X 100MG PO TBPK: ORAL_TABLET | ORAL | 0 refills | Status: DC

## 2022-08-29 MED ORDER — BENZONATATE 100 MG PO CAPS: 100.0000 mg | ORAL_CAPSULE | Freq: Three times a day (TID) | ORAL | 0 refills | Status: DC | PRN

## 2022-08-29 NOTE — Progress Notes (Signed)
    SUBJECTIVE:   CHIEF COMPLAINT / HPI:   Saturday started feeling sick and yesterday started feeling worse.  Symptoms of bad headaches, throat hurts to swallow. Chills yesterday. Feels hot and cold from time to time. No temperature taken. Feel nauseous.  Has been having a dry cough. Congested. Appetite low. Body aches.  Any diarrhea. Has been taking mucinex, elderberry gummies, zinc with vitamin C and D.  He is not supposed to take Tylenol or ibuprofen.  [name] and son are sick as well.  She also mentions that she was bleeding yesterday and is now not having any bleeding.  She is scheduled for a myomectomy in February.  She is currently taking Megace 40 mg twice daily.  She denies any acute abdominal pain.  PERTINENT  PMH / PSH: fibroid  OBJECTIVE:   BP (!) 138/105   Pulse 81   Temp 98.2 F (36.8 C) (Oral)   Wt 181 lb 2 oz (82.2 kg)   SpO2 100%   BMI 33.13 kg/m   General: NAD, awake, alert, responsive to questions Head: Normocephalic atraumatic, nasal congestion present, tonsils erythematous without exudates, moist mucous membranes, TMs without erythema, right TM mild fluid behind membrane but no bulging or erythema, postauricular lymph nodes tender, anterior lymph nodes nontender CV: Regular rate and rhythm no murmurs rubs or gallops Respiratory: Clear to ausculation bilaterally, mild crackles on examination, chest rises symmetrically,  no increased work of breathing Abdomen: Soft, mildly-tender lower left (her baseline per patient), non-distended, normoactive bowel sounds  Extremities: Moves upper and lower extremities freely, no edema in LE Neuro: No focal deficits Skin: No rashes or lesions visualized   ASSESSMENT/PLAN:   Fibroid, uterine She had bleeding yesterday but none currently.  No severe abdominal pain on examination. -Continue Megace per gynecology -Myomectomy in February  Upper respiratory tract infection-COVID Positive for COVID in office today.  Provided work  note for patient to be out the rest of this week.  Discussed masking precautions and isolation precautions per CDC guidelines with patient.  I discussed that Paxlovid not typically.  Patient would like this prescribed for her. - ondansetron (ZOFRAN-ODT) 4 MG disintegrating tablet - benzonatate (TESSALON PERLES) 100 MG capsule - nirmatrelvir & ritonavir (PAXLOVID, 300/100,) 20 x 150 MG & 10 x '100MG'$  TBPK  Gerrit Heck, MD [place]

## 2022-08-29 NOTE — Patient Instructions (Signed)
It was great to see you! Thank you for allowing me to participate in your care!   Our plans for today:  - I have refilled your zofran - I have sent in tessalon perles - I have sent in flonase - Please return if your ear pain worsens as your cold gets better  Take care and seek immediate care sooner if you develop any concerns.  Gerrit Heck, MD

## 2022-08-29 NOTE — Assessment & Plan Note (Signed)
She had bleeding yesterday but none currently.  No severe abdominal pain on examination. -Continue Megace per gynecology -Myomectomy in February

## 2022-09-08 ENCOUNTER — Other Ambulatory Visit (HOSPITAL_COMMUNITY): Payer: Self-pay

## 2022-09-20 ENCOUNTER — Other Ambulatory Visit (HOSPITAL_COMMUNITY): Payer: Self-pay

## 2022-09-29 ENCOUNTER — Other Ambulatory Visit: Payer: Self-pay | Admitting: Family Medicine

## 2022-09-30 ENCOUNTER — Telehealth: Payer: Self-pay | Admitting: Anesthesiology

## 2022-09-30 ENCOUNTER — Ambulatory Visit (INDEPENDENT_AMBULATORY_CARE_PROVIDER_SITE_OTHER): Payer: Medicaid Other | Admitting: Family Medicine

## 2022-09-30 ENCOUNTER — Encounter: Payer: Self-pay | Admitting: Family Medicine

## 2022-09-30 VITALS — BP 132/103 | HR 80 | Temp 98.5°F | Ht 62.0 in | Wt 179.6 lb

## 2022-09-30 DIAGNOSIS — G43101 Migraine with aura, not intractable, with status migrainosus: Secondary | ICD-10-CM | POA: Diagnosis not present

## 2022-09-30 DIAGNOSIS — R519 Headache, unspecified: Secondary | ICD-10-CM

## 2022-09-30 DIAGNOSIS — G43809 Other migraine, not intractable, without status migrainosus: Secondary | ICD-10-CM

## 2022-09-30 DIAGNOSIS — R030 Elevated blood-pressure reading, without diagnosis of hypertension: Secondary | ICD-10-CM | POA: Diagnosis not present

## 2022-09-30 MED ORDER — KETOROLAC TROMETHAMINE 30 MG/ML IJ SOLN
30.0000 mg | Freq: Once | INTRAMUSCULAR | Status: AC
  Administered 2022-09-30: 30 mg via INTRAMUSCULAR

## 2022-09-30 MED ORDER — PREDNISONE 20 MG PO TABS: 20.0000 mg | ORAL_TABLET | Freq: Every day | ORAL | 0 refills | Status: AC

## 2022-09-30 MED ORDER — ONDANSETRON 4 MG PO TBDP: 4.0000 mg | ORAL_TABLET | Freq: Three times a day (TID) | ORAL | 0 refills | Status: DC | PRN

## 2022-09-30 NOTE — Telephone Encounter (Signed)
Pt called stating she needs a refill on her Aimovig, she was told by pharmacy it needs a PA.

## 2022-09-30 NOTE — Assessment & Plan Note (Signed)
Given current headache, will not initiate antihypertensive therapy. F/U appointment made with PCP for BP reassessment and management. Continue home BP monitoring.  She agreed with the plan.

## 2022-09-30 NOTE — Telephone Encounter (Signed)
Patient due for Aimovig last week. Advised patient to please stop the office to pick up samples.  PA team please start PA for Aiovig 140 mg. PA expired 08/2022

## 2022-09-30 NOTE — Assessment & Plan Note (Signed)
Headache likely due to her chronic migraine. ?? Associated viral illness. Temp normal during this visit. She declined respiratory panel testing. Toradol 30 mg IM injection given. Since she drove herself, we held off on antiemetic treatment in office. I refilled her Zofran prn N/V. Prescribed Prednisone x 3 days. Discussed adding on Magnesium oxide daily prn with Tylenol or Ibuprofen. F/U with neurology for Migraine management. ED precaution discussed.

## 2022-09-30 NOTE — Progress Notes (Signed)
    SUBJECTIVE:   CHIEF COMPLAINT / HPI:   Headaches:  The patient is here for a headache of 9/10 in severity. This has been ongoing for weeks due to her chronic Migraine headache, but it got worse this morning. She had two episodes of vomiting today and a low-grade fever of 100. She works at the daycare center, and her daughter had a URI recently. She has not been able to receive her Migraine monthly injection due to insurance coverage, and this has made her symptoms worsen. She has yet to take Advil or Tylenol today. She denies cough, otalgia or throat pain.  Elevated BP: Only symptoms today is headache. BP have been running high lately.   PERTINENT  PMH / PSH: PMHx  OBJECTIVE:   Vitals:   09/30/22 0931 09/30/22 0947 09/30/22 0950  BP: (!) 137/94 (!) 139/101 (!) 132/103  Pulse: 83 80   Temp: 98.5 F (36.9 C)    SpO2: 100%    Weight: 179 lb 9.6 oz (81.5 kg)    Height: '5\' 2"'$  (1.575 m)      Physical Exam Vitals and nursing note reviewed.  Cardiovascular:     Rate and Rhythm: Normal rate and regular rhythm.     Heart sounds: Normal heart sounds. No murmur heard. Pulmonary:     Effort: Pulmonary effort is normal. No respiratory distress.     Breath sounds: Normal breath sounds. No wheezing.  Musculoskeletal:     Cervical back: Neck supple.  Neurological:     General: No focal deficit present.     Mental Status: She is alert.     Cranial Nerves: Cranial nerves 2-12 are intact.     Sensory: Sensation is intact.     Motor: Motor function is intact.     Deep Tendon Reflexes: Reflexes are normal and symmetric.     Comments: No signs of meningeal irritation.      ASSESSMENT/PLAN:   Migraine with aura and with status migrainosus, not intractable Headache likely due to her chronic migraine. ?? Associated viral illness. Temp normal during this visit. She declined respiratory panel testing. Toradol 30 mg IM injection given. Since she drove herself, we held off on  antiemetic treatment in office. I refilled her Zofran prn N/V. Prescribed Prednisone x 3 days. Discussed adding on Magnesium oxide daily prn with Tylenol or Ibuprofen. F/U with neurology for Migraine management. ED precaution discussed.   Elevated BP without diagnosis of hypertension Given current headache, will not initiate antihypertensive therapy. F/U appointment made with PCP for BP reassessment and management. Continue home BP monitoring.  She agreed with the plan.     Andrena Mews, MD [place]

## 2022-09-30 NOTE — Patient Instructions (Addendum)
Please obtain magnesium oxide over the counter from the pharmacy and use daily as needed for Migraine headaches in addition to Tylenol or Ibuprofen as needed. Please follow up in 1 to 2 weeks for BP check. Please call or go to the ED if your symptoms worsens. Stay well.   Migraine Headache A migraine headache is a very strong throbbing pain on one side or both sides of your head. This type of headache can also cause other symptoms. It can last from 4 hours to 3 days. Talk with your doctor about what things may bring on (trigger) this condition. What are the causes? The exact cause of this condition is not known. This condition may be triggered or caused by: Drinking alcohol. Smoking. Taking medicines, such as: Medicine used to treat chest pain (nitroglycerin). Birth control pills. Estrogen. Some blood pressure medicines. Eating or drinking certain products. Doing physical activity. Other things that may trigger a migraine headache include: Having a menstrual period. Pregnancy. Hunger. Stress. Not getting enough sleep or getting too much sleep. Weather changes. Tiredness (fatigue). What increases the risk? Being 67-71 years old. Being female. Having a family history of migraine headaches. Being Caucasian. Having depression or anxiety. Being very overweight. What are the signs or symptoms? A throbbing pain. This pain may: Happen in any area of the head, such as on one side or both sides. Make it hard to do daily activities. Get worse with physical activity. Get worse around bright lights or loud noises. Other symptoms may include: Feeling sick to your stomach (nauseous). Vomiting. Dizziness. Being sensitive to bright lights, loud noises, or smells. Before you get a migraine headache, you may get warning signs (an aura). An aura may include: Seeing flashing lights or having blind spots. Seeing bright spots, halos, or zigzag lines. Having tunnel vision or blurred  vision. Having numbness or a tingling feeling. Having trouble talking. Having weak muscles. Some people have symptoms after a migraine headache (postdromal phase), such as: Tiredness. Trouble thinking (concentrating). How is this treated? Taking medicines that: Relieve pain. Relieve the feeling of being sick to your stomach. Prevent migraine headaches. Treatment may also include: Having acupuncture. Avoiding foods that bring on migraine headaches. Learning ways to control your body functions (biofeedback). Therapy to help you know and deal with negative thoughts (cognitive behavioral therapy). Follow these instructions at home: Medicines Take over-the-counter and prescription medicines only as told by your doctor. Ask your doctor if the medicine prescribed to you: Requires you to avoid driving or using heavy machinery. Can cause trouble pooping (constipation). You may need to take these steps to prevent or treat trouble pooping: Drink enough fluid to keep your pee (urine) pale yellow. Take over-the-counter or prescription medicines. Eat foods that are high in fiber. These include beans, whole grains, and fresh fruits and vegetables. Limit foods that are high in fat and sugar. These include fried or sweet foods. Lifestyle Do not drink alcohol. Do not use any products that contain nicotine or tobacco, such as cigarettes, e-cigarettes, and chewing tobacco. If you need help quitting, ask your doctor. Get at least 8 hours of sleep every night. Limit and deal with stress. General instructions Keep a journal to find out what may bring on your migraine headaches. For example, write down: What you eat and drink. How much sleep you get. Any change in what you eat or drink. Any change in your medicines. If you have a migraine headache: Avoid things that make your symptoms worse, such as bright  lights. It may help to lie down in a dark, quiet room. Do not drive or use heavy  machinery. Ask your doctor what activities are safe for you. Keep all follow-up visits as told by your doctor. This is important. Contact a doctor if: You get a migraine headache that is different or worse than others you have had. You have more than 15 headache days in one month. Get help right away if: Your migraine headache gets very bad. Your migraine headache lasts longer than 72 hours. You have a fever. You have a stiff neck. You have trouble seeing. Your muscles feel weak or like you cannot control them. You start to lose your balance a lot. You start to have trouble walking. You pass out (faint). You have a seizure. Summary A migraine headache is a very strong throbbing pain on one side or both sides of your head. These headaches can also cause other symptoms. This condition may be treated with medicines and changes to your lifestyle. Keep a journal to find out what may bring on your migraine headaches. Contact a doctor if you get a migraine headache that is different or worse than others you have had. Contact your doctor if you have more than 15 headache days in a month. This information is not intended to replace advice given to you by your health care provider. Make sure you discuss any questions you have with your health care provider. Document Revised: 01/20/2022 Document Reviewed: 09/20/2018 Elsevier Patient Education  [place].

## 2022-10-03 NOTE — Progress Notes (Signed)
    SUBJECTIVE:   CHIEF COMPLAINT / HPI:   Migraine: Seen on 09/30/2022 for management of migraines as patient ran out of medication from neurologist. Prescribed Prednisone 97m x 3 days and Toradol shot that helped. Per neurology note, they are working on prior auth for Aimovig and samples are available for pick up. Also taking Eletriptan with some relief. Currently reports 7/10 migraine pain today.  BP check: Elevated at last visit likely in the setting of pain. Some mildly elevated reading in 140/90s in the past 6 months.  PERTINENT  PMH / PSH: Migraine  OBJECTIVE:   BP 132/84   Pulse 83   Ht 5' 2"$  (1.575 m)   Wt 181 lb (82.1 kg)   SpO2 100%   BMI 33.11 kg/m    General: NAD, pleasant, able to participate in exam Respiratory: normal effort Extremities: no edema or cyanosis. Skin: warm and dry, no rashes noted Neuro: alert, no obvious focal deficits Psych: Normal affect and mood  ASSESSMENT/PLAN:   Migraine with aura and with status migrainosus, not intractable Sx improvement with Toradol and Prednisone 260mx 3 days at last visit. Declined another Toradol shot due to pain. Working with Neurology to obtain Aimovig, per note sample available to pick up at their office today (notified patient). Will give Prednisone 2034m 3 days and Meloxicam 60m77m bridge until obtaining medication.  Elevated BP without diagnosis of hypertension Normal BP today. Rx for BP cuff sent and provided patient with instructions on use.     Dr. KatiColletta Maryland [place]

## 2022-10-05 ENCOUNTER — Other Ambulatory Visit: Payer: Self-pay

## 2022-10-05 ENCOUNTER — Encounter (HOSPITAL_BASED_OUTPATIENT_CLINIC_OR_DEPARTMENT_OTHER): Payer: Self-pay | Admitting: Obstetrics & Gynecology

## 2022-10-05 DIAGNOSIS — Z01818 Encounter for other preprocedural examination: Secondary | ICD-10-CM | POA: Diagnosis not present

## 2022-10-05 DIAGNOSIS — T8332XA Displacement of intrauterine contraceptive device, initial encounter: Secondary | ICD-10-CM | POA: Diagnosis not present

## 2022-10-05 NOTE — Progress Notes (Signed)
Your procedure is scheduled on Wednesday, 10/19/2022.  Report to [facility] AT  5:30  AM.   Call this number if you have problems the morning of surgery  :[phone].   OUR ADDRESS IS [address].  [facility]ATED [facility].  PLEASE BRING YOUR INSURANCE CARD AND PHOTO ID DAY OF SURGERY.  ONLY 2 PEOPLE ARE ALLOWED IN  WAITING  ROOM / CURRENTLY NO ONE UNDER AGE 35                                     REMEMBER:  DO NOT EAT FOOD, CANDY GUM OR MINTS  AFTER MIDNIGHT THE NIGHT BEFORE YOUR SURGERY . YOU MAY HAVE CLEAR LIQUIDS FROM MIDNIGHT THE NIGHT BEFORE YOUR SURGERY UNTIL  4:30 AM. NO CLEAR LIQUIDS AFTER   4:30 AM DAY OF SURGERY.  YOU MAY  BRUSH YOUR TEETH MORNING OF SURGERY AND RINSE YOUR MOUTH OUT, NO CHEWING GUM CANDY OR MINTS.     CLEAR LIQUID DIET    Allowed      Water                                                                   Coffee and tea, regular and decaf  (NO cream or milk products of any type, may sweeten)                         Carbonated beverages, regular and diet                                    Sports drinks like Gatorade _____________________________________________________________________     TAKE ONLY THESE MEDICATIONS MORNING OF SURGERY: Albuterol inhaler if needed, Relpax, Zofran if needed,  Ubrevly if needed  Please bring your inhaler with you on the day of surgery (if you currently have an inhaler.)   UP TO 4 VISITORS  MAY VISIT IN THE EXTENDED RECOVERY ROOM UNTIL 800 PM ONLY.  ONE  VISITOR AGE 82 AND OVER MAY SPEND THE NIGHT AND MUST BE IN EXTENDED RECOVERY ROOM NO LATER THAN 800 PM . YOUR DISCHARGE TIME AFTER YOU SPEND THE NIGHT IS 900 AM THE MORNING AFTER YOUR SURGERY.  YOU MAY PACK A SMALL OVERNIGHT BAG WITH TOILETRIES FOR YOUR OVERNIGHT STAY IF YOU WISH.  YOUR PRESCRIPTION MEDICATIONS WILL BE PROVIDED DURING [place].                                      DO NOT WEAR JEWERLY/   METAL/  PIERCINGS (INCLUDING NO PLASTIC PIERCINGS) DO NOT WEAR LOTIONS, POWDERS, PERFUMES OR NAIL POLISH ON YOUR FINGERNAILS. TOENAIL POLISH IS OK TO WEAR. DO NOT SHAVE FOR 48 HOURS PRIOR TO DAY OF SURGERY.  CONTACTS, GLASSES, OR DENTURES MAY NOT BE WORN TO SURGERY.  REMEMBER: NO SMOKING, VAPING ,  DRUGS OR ALCOHOL FOR 24 HOURS BEFORE YOUR SURGERY.  [facility] IS NOT RESPONSIBLE  FOR ANY BELONGINGS.                                                                    Marland Kitchen           [facility] - Preparing for Surgery Before surgery, you can play an important role.  Because skin is not sterile, your skin needs to be as free of germs as possible.  You can reduce the number of germs on your skin by washing with CHG (chlorahexidine gluconate) soap before surgery.  CHG is an antiseptic cleaner which kills germs and bonds with the skin to continue killing germs even after washing. Please DO NOT use if you have an allergy to CHG or antibacterial soaps.  If your skin becomes reddened/irritated stop using the CHG and inform your nurse when you arrive at Short Stay. Do not shave (including legs and underarms) for at least 48 hours prior to the first CHG shower.  You may shave your face/neck. Please follow these instructions carefully:  1.  Shower with CHG Soap the night before surgery and the  morning of Surgery.  2.  If you choose to wash your hair, wash your hair first as usual with your  normal  shampoo.  3.  After you shampoo, rinse your hair and body thoroughly to remove the  shampoo.                                        4.  Use CHG as you would any other liquid soap.  You can apply chg directly  to the skin and wash , chg soap provided, night before and morning of your surgery.  5.  Apply the CHG Soap to your body ONLY FROM THE NECK DOWN.   Do not use on face/ open                           Wound or open sores. Avoid contact with eyes, ears mouth and genitals (private  parts).                       Wash face,  Genitals (private parts) with your normal soap.             6.  Wash thoroughly, paying special attention to the area where your surgery  will be performed.  7.  Thoroughly rinse your body with warm water from the neck down.  8.  DO NOT shower/wash with your normal soap after using and rinsing off  the CHG Soap.             9.  Pat yourself dry with a clean towel.            10.  Wear clean pajamas.            11.  Place clean sheets on your bed the night of your first shower and do not  sleep with pets. Day of Surgery : Do not apply any lotions/ powders the morning of surgery.  Please wear clean clothes to the hospital/surgery  center.  IF YOU HAVE ANY SKIN IRRITATION OR PROBLEMS WITH THE SURGICAL SOAP, PLEASE GET A BAR OF GOLD DIAL SOAP AND SHOWER THE NIGHT BEFORE YOUR SURGERY AND THE MORNING OF YOUR SURGERY. PLEASE LET THE NURSE KNOW MORNING OF YOUR SURGERY IF YOU HAD ANY PROBLEMS WITH THE SURGICAL SOAP.   ________________________________________________________________________                                                        QUESTIONS [facility] PRE OP NURSE PHONE [phone].

## 2022-10-05 NOTE — Progress Notes (Signed)
Spoke w/ via phone for pre-op interview---[facility] needs dos---- urine pregnancy per anesthesia, surgeon orders pending as of 10/05/22              Lab results------10/17/22 lab appt for cbc, type & screen COVID test -----patient states asymptomatic no test needed Arrive at -------0530 on Wednesday, 10/19/2022 NPO after MN NO Solid Food.  Clear liquids from MN until---0430 Med rec completed Medications to take morning of surgery -----Albuterol inhaler prn, Relpax, Zofran prn, Ubrevly prn Diabetic medication -----n/a Patient instructed no nail polish to be worn day of surgery Patient instructed to bring photo id and insurance card day of surgery Patient aware to have Driver (ride ) / caregiver    for 24 hours after surgery - mother, [place] Patient Special Instructions -----Extended / overnight stay instructions given. Bring albuterol inhaler on day of surgery. Pre-Op special Istructions -----Requested orders from Dr. Sabra Heck via Epic IB on 10/05/22. Patient verbalized understanding of instructions that were given at this phone interview. Patient denies shortness of breath, chest pain, fever, cough at this phone interview.

## 2022-10-07 ENCOUNTER — Ambulatory Visit (INDEPENDENT_AMBULATORY_CARE_PROVIDER_SITE_OTHER): Payer: Medicaid Other | Admitting: Family Medicine

## 2022-10-07 ENCOUNTER — Encounter: Payer: Self-pay | Admitting: Family Medicine

## 2022-10-07 VITALS — BP 132/84 | HR 83 | Ht 62.0 in | Wt 181.0 lb

## 2022-10-07 DIAGNOSIS — G43809 Other migraine, not intractable, without status migrainosus: Secondary | ICD-10-CM | POA: Diagnosis not present

## 2022-10-07 DIAGNOSIS — R030 Elevated blood-pressure reading, without diagnosis of hypertension: Secondary | ICD-10-CM

## 2022-10-07 DIAGNOSIS — G43101 Migraine with aura, not intractable, with status migrainosus: Secondary | ICD-10-CM

## 2022-10-07 MED ORDER — BLOOD PRESSURE CUFF MISC: 1.0000 [IU] | Freq: Every day | 0 refills | Status: DC

## 2022-10-07 MED ORDER — PREDNISONE 20 MG PO TABS: 20.0000 mg | ORAL_TABLET | Freq: Every day | ORAL | 0 refills | Status: AC

## 2022-10-07 MED ORDER — MELOXICAM 15 MG PO TABS: 15.0000 mg | ORAL_TABLET | Freq: Every day | ORAL | 0 refills | Status: AC

## 2022-10-07 NOTE — Assessment & Plan Note (Signed)
Normal BP today. Rx for BP cuff sent and provided patient with instructions on use.

## 2022-10-07 NOTE — Patient Instructions (Signed)
It was wonderful to see you today! Thank you for choosing [name].   Please bring ALL of your medications with you to every visit.   Today we talked about:  I am giving you a short course of steroids and the Meloxicam to help with your migraine. Please follow up with your neurologist and see if they can obtain your migraine medications for you. I sent in a blood pressure cuff. Please see the log and instructions on proper use below.  Please follow up as needed for symptom control  Call the clinic at [phone] if your symptoms worsen or you have any concerns.  Please be sure to schedule follow up at the front desk before you leave today.   Colletta Maryland, DO Family Medicine    Blood Pressure Record Sheet To take your blood pressure, you will need a blood pressure machine. You can buy a blood pressure machine (blood pressure monitor) at your clinic, drug store, or online. When choosing one, consider: An automatic monitor that has an arm cuff. A cuff that wraps snugly around your upper arm. You should be able to fit only one finger between your arm and the cuff. A device that stores blood pressure reading results. Do not choose a monitor that measures your blood pressure from your wrist or finger. Follow your health care provider's instructions for how to take your blood pressure. To use this form: Take your blood pressure medications every day These measurements should be taken when you have been at rest for at least 10-15 min Take at least 2 readings with each blood pressure check. This makes sure the results are correct. Wait 1-2 minutes between measurements. Write down the results in the spaces on this form. Keep in mind it should always be recorded systolic over diastolic. Both numbers are important.  Repeat this every day for 2-3 weeks, or as told by your health care provider.  Make a follow-up appointment with your health care provider to discuss the  results.  Blood Pressure Log Date Medications taken? (Y/N) Blood Pressure Time of Day

## 2022-10-07 NOTE — Assessment & Plan Note (Addendum)
Sx improvement with Toradol and Prednisone 47m x 3 days at last visit. Declined another Toradol shot due to pain. Working with Neurology to obtain Aimovig, per note sample available to pick up at their office today (notified patient). Will give Prednisone 256mx 3 days and Meloxicam 1520mo bridge until obtaining medication.

## 2022-10-14 ENCOUNTER — Other Ambulatory Visit (HOSPITAL_BASED_OUTPATIENT_CLINIC_OR_DEPARTMENT_OTHER): Payer: Self-pay | Admitting: Obstetrics & Gynecology

## 2022-10-14 DIAGNOSIS — D251 Intramural leiomyoma of uterus: Secondary | ICD-10-CM

## 2022-10-17 ENCOUNTER — Ambulatory Visit (INDEPENDENT_AMBULATORY_CARE_PROVIDER_SITE_OTHER): Payer: Medicaid Other | Admitting: Family Medicine

## 2022-10-17 ENCOUNTER — Telehealth: Payer: Self-pay

## 2022-10-17 ENCOUNTER — Ambulatory Visit (HOSPITAL_BASED_OUTPATIENT_CLINIC_OR_DEPARTMENT_OTHER): Payer: Medicaid Other | Admitting: Obstetrics & Gynecology

## 2022-10-17 ENCOUNTER — Ambulatory Visit (HOSPITAL_COMMUNITY)
Admission: RE | Admit: 2022-10-17 | Discharge: 2022-10-17 | Disposition: A | Discharge status: Home / Self Care | Payer: Medicaid Other | Source: Ambulatory Visit | Attending: Obstetrics & Gynecology | Admitting: Obstetrics & Gynecology

## 2022-10-17 ENCOUNTER — Other Ambulatory Visit: Payer: Self-pay

## 2022-10-17 ENCOUNTER — Encounter (HOSPITAL_BASED_OUTPATIENT_CLINIC_OR_DEPARTMENT_OTHER): Payer: Self-pay | Admitting: Radiology

## 2022-10-17 ENCOUNTER — Encounter (HOSPITAL_COMMUNITY)
Admission: RE | Admit: 2022-10-17 | Discharge: 2022-10-17 | Disposition: A | Discharge status: Home / Self Care | Payer: Medicaid Other | Source: Ambulatory Visit | Attending: Obstetrics & Gynecology | Admitting: Obstetrics & Gynecology

## 2022-10-17 ENCOUNTER — Other Ambulatory Visit (HOSPITAL_BASED_OUTPATIENT_CLINIC_OR_DEPARTMENT_OTHER): Payer: Self-pay | Admitting: Obstetrics & Gynecology

## 2022-10-17 VITALS — BP 138/78 | HR 85 | Ht 62.0 in | Wt 178.4 lb

## 2022-10-17 DIAGNOSIS — N920 Excessive and frequent menstruation with regular cycle: Secondary | ICD-10-CM

## 2022-10-17 DIAGNOSIS — F410 Panic disorder [episodic paroxysmal anxiety] without agoraphobia: Secondary | ICD-10-CM

## 2022-10-17 DIAGNOSIS — Z01818 Encounter for other preprocedural examination: Secondary | ICD-10-CM | POA: Insufficient documentation

## 2022-10-17 DIAGNOSIS — I10 Essential (primary) hypertension: Secondary | ICD-10-CM | POA: Diagnosis not present

## 2022-10-17 DIAGNOSIS — T8332XA Displacement of intrauterine contraceptive device, initial encounter: Secondary | ICD-10-CM

## 2022-10-17 DIAGNOSIS — F411 Generalized anxiety disorder: Secondary | ICD-10-CM | POA: Diagnosis not present

## 2022-10-17 DIAGNOSIS — Z0389 Encounter for observation for other suspected diseases and conditions ruled out: Secondary | ICD-10-CM | POA: Diagnosis not present

## 2022-10-17 DIAGNOSIS — Z01812 Encounter for preprocedural laboratory examination: Secondary | ICD-10-CM

## 2022-10-17 LAB — CBC
HCT: 36.5 % (ref 36.0–46.0)
Hemoglobin: 12.2 g/dL (ref 12.0–15.0)
MCH: 29.5 pg (ref 26.0–34.0)
MCHC: 33.4 g/dL (ref 30.0–36.0)
MCV: 88.2 fL (ref 80.0–100.0)
Platelets: 282 10*3/uL (ref 150–400)
RBC: 4.14 MIL/uL (ref 3.87–5.11)
RDW: 12.8 % (ref 11.5–15.5)
WBC: 7.9 10*3/uL (ref 4.0–10.5)
nRBC: 0 % (ref 0.0–0.2)

## 2022-10-17 MED ORDER — AMLODIPINE BESYLATE 5 MG PO TABS: 5.0000 mg | ORAL_TABLET | Freq: Every day | ORAL | 0 refills | Status: DC

## 2022-10-17 MED ORDER — HYDROXYZINE HCL 25 MG PO TABS: 25.0000 mg | ORAL_TABLET | Freq: Three times a day (TID) | ORAL | 3 refills | Status: DC | PRN

## 2022-10-17 NOTE — Progress Notes (Signed)
SUBJECTIVE:   CHIEF COMPLAINT / HPI:   [name] is a 37 y.o. female who presents to the [facility] clinic today to discuss the following concerns:   Blood Pressure Follow Up Patient called the nurse line this morning requesting follow-up for blood pressure.  She had a preoperative appointment today for a myomectomy schedule 2/28.  She reports that her blood pressures were elevated at her preop appointment, BP was 139/103.  She has no history of hypertension and is not on any antihypertensive medications.  BP Readings from Last 3 Encounters:  10/17/22 (!) 144/93  10/17/22 (!) 139/103  10/07/22 132/84    PERTINENT  PMH / PSH: Migraines, s/p salpingectomy   OBJECTIVE:   BP (!) 144/93   Pulse 85   Ht '5\' 2"'$  (1.575 m)   Wt 178 lb 6.4 oz (80.9 kg)   SpO2 100%   BMI 32.63 kg/m   Vitals:   10/17/22 1435 10/17/22 1450  BP: (!) 144/93 138/78  Pulse: 85   SpO2: 100%    General: NAD, pleasant, able to participate in exam Cardiac: RRR, no murmurs. Respiratory: CTAB, normal effort, No wheezes, rales or rhonchi Neuro: alert, no obvious focal deficits Psych: Normal affect and mood     10/17/2022    2:36 PM 10/07/2022    3:27 PM 09/30/2022    9:32 AM  Depression screen PHQ 2/9  Decreased Interest '3 1 1  '$ Down, Depressed, Hopeless 1 1 0  PHQ - 2 Score '4 2 1  '$ Altered sleeping '2 2 3  '$ Tired, decreased energy '3 2 3  '$ Change in appetite '2 2 3  '$ Feeling bad or failure about yourself  0 0 0  Trouble concentrating 0 0 0  Moving slowly or fidgety/restless 0 0 0  Suicidal thoughts 0 0 0  PHQ-9 Score '11 8 10  '$ Difficult doing work/chores  Not difficult at all Not difficult at all      10/17/2022    2:58 PM 07/22/2019   10:06 AM  GAD 7 : Generalized Anxiety Score  Nervous, Anxious, on Edge 3 3  Control/stop worrying 3 3  Worry too much - different things 3 3  Trouble relaxing 3 2  Restless 2 2  Easily annoyed or irritable 3 2  Afraid - awful might happen 0 2  Total GAD 7 Score 17  17  Anxiety Difficulty Somewhat difficult Very difficult    ASSESSMENT/PLAN:   1. Essential hypertension Elevated today and improved slightly on recheck.  She does have a consistent pattern of elevated blood pressures. We discussed 24-hour ambulatory monitoring for additional data points vs medication initiation and shared decision to start Amlodipine.   - amLODipine (NORVASC) 5 MG tablet; Take 1 tablet (5 mg total) by mouth at bedtime.  Dispense: 90 tablet; Refill: 0 - Return in 2 weeks for BP check   2. Generalized anxiety disorder with panic attacks GAD-7 elevated at 17.  Per chart review she was started on sertraline in the past but this made her anxiety worse. She was switched to elavil for her mood and for migraine prevention but appears neurology has switched her to propranolol. She is not taking this. She would like to go back on a medication for anxiety. PHQ-9 slightly elevated as well but she reports her anxiety is worse.  She is interested in therapy/counseling.  She denies SI. - hydrOXYzine (ATARAX) 25 MG tablet; Take 1 tablet (25 mg total) by mouth 3 (three) times daily as needed  for anxiety.  Dispense: 30 tablet; Refill: 3 -Counseling/therapy recommendations provided on AVS - F/u in 2 weeks    Sharion Settler, [place]

## 2022-10-17 NOTE — Telephone Encounter (Signed)
Patient calls nurse line requesting an apt this afternoon.   She reports she has surgery scheduled for 2/28. However, she reports she went to pre op apt today and her BP was elevated. She reports she was told she would not be able to go under anesthesia with elevated BP. She reports her numbers were "140s."  Last documented BP was 132/84 on 2/16. She reports she is not on BP medication at this time.   Patient scheduled for this afternoon for BP control.

## 2022-10-17 NOTE — Patient Instructions (Addendum)
It was wonderful to see you today.  Please bring ALL of your medications with you to every visit.   Today we talked about:  Your blood pressure was still a little elevated today. We are starting you on a low dose of Amlodipine. Take one pill daily. Schedule an appointment to see [place] in 2 weeks for a recheck.  I am prescribing Hydroxyzine which you can take up to three times a day as needed for anxiety.   See below for therapy/counseling resources.  Therapy and Counseling Resources Most providers on this list will take Medicaid. Patients with commercial insurance or Medicare should contact their insurance company to get a list of in network providers.  [employer] (takes children) Location 1: [address], [place], [place] [address] Location 2: [place], [place] [address] [place] ([place] speaking therapist available)(habla espanol)(take medicare and medicaid)  [place], [place], [place] [address], [place] [email or web address] [phone]  [facility]:Psychiatry and Counseling [address] [facility] [place]. [place], [place] [address] [place], [place], [place] [address]      [phone]  [place] (spanish available) [place], [place] [address] [place] (take [facility] and medicare) [address] [place], [place] [address]       [phone]     [facility] (virtual only) [phone]  [facility]inny Bl[facility] [address] [place], [place]  [facility]:  [place]. [place] [phone]  [facility]:  [place] [address] [place] [phone]  [facility] (under & uninsured) [address], [place] [phone]    [email or web address]    [place] [phone] [address]  [place]    [phone]  [facility]     Phone:  [phone]     [place]  [place] [address]      [place], [place] ext [place]: [place], [place], [place]   [place] (Alpha therapist) [email or web address]  [place] [address]   [place] [address]    [phone]    [facility]   [address],  [place], [place]   [place]  [place]  [facility]  [address] [place], [place]        [phone]  Open Access/Walk In Clinic under & uninsured  [facility]  [address] [place], [place] Crisis [phone]  [facility] [place],  ([place])   [place], [place]: [phone]) 8:30 - 12; 1 - 2:30  [facility] of the [place],  [place], [place]    ([phone]):8:30 - 12; 2 - 3PM  [facility] [employer],  [address],  [place]; [phone]):   Mon - Fri 8 AM - 5 PM  [facility] [place]  MWF 12:30 to 3:00 or call to schedule an appointment  [phone]  Specific Provider options Psychology Today  [email or web address] click on find a therapist  enter your  zip code left side and select or tailor a therapist for your specific need.   [facility] Provider Directory [email or web address]  (Medicaid)   Follow all drop down to find a provider  [place] or [email or web address], [place], [place] Recovery support and educational   24- Hour Availability:   [facility]  [address] [place], [place] Crisis [phone]  [facility] of the [employer] [phone]  [place]  [phone]   [place]  [phone] (after hours)  Therapeutic Alternative/Mobile Crisis   [phone]  [place] National Suicide Hotline  [phone] Diamantina Monks)  Call 911 or go to emergency room  [facility]  [phone]);  [place] and [employer]  [phone]); [place], [place], [place], [place], [place], [place], [place]    Thank you for coming to your visit as scheduled. We have had a large "no-show" problem lately, and this significantly limits our ability to see and care for patients. As a friendly reminder- if you cannot make your appointment please call to cancel. We do have a no show policy for those who do not cancel within 24 hours. Our policy is that if you miss or fail to cancel an appointment within 24 hours, 3 times in a 65-monthperiod, you may be dismissed from our clinic.   Thank you for choosing [facility]   Please call [phone] any questions about today's appointment.  Please be sure to schedule follow up at the front  desk before you leave today.   ASharion Settler DO PGY-3 Family Medicine

## 2022-10-19 ENCOUNTER — Encounter (HOSPITAL_BASED_OUTPATIENT_CLINIC_OR_DEPARTMENT_OTHER): Payer: Self-pay | Admitting: Obstetrics & Gynecology

## 2022-10-19 ENCOUNTER — Ambulatory Visit (HOSPITAL_BASED_OUTPATIENT_CLINIC_OR_DEPARTMENT_OTHER): Payer: Medicaid Other | Admitting: Anesthesiology

## 2022-10-19 ENCOUNTER — Encounter (HOSPITAL_BASED_OUTPATIENT_CLINIC_OR_DEPARTMENT_OTHER)
Admission: RE | Disposition: A | Discharge status: Home / Self Care | Payer: Self-pay | Source: Home / Self Care | Attending: Obstetrics & Gynecology

## 2022-10-19 ENCOUNTER — Other Ambulatory Visit: Payer: Self-pay

## 2022-10-19 ENCOUNTER — Ambulatory Visit (HOSPITAL_BASED_OUTPATIENT_CLINIC_OR_DEPARTMENT_OTHER)
Admission: RE | Admit: 2022-10-19 | Discharge: 2022-10-19 | Disposition: A | Discharge status: Home / Self Care | Payer: Medicaid Other | Attending: Obstetrics & Gynecology | Admitting: Obstetrics & Gynecology

## 2022-10-19 DIAGNOSIS — Z01818 Encounter for other preprocedural examination: Secondary | ICD-10-CM

## 2022-10-19 DIAGNOSIS — I10 Essential (primary) hypertension: Secondary | ICD-10-CM | POA: Diagnosis not present

## 2022-10-19 DIAGNOSIS — N920 Excessive and frequent menstruation with regular cycle: Secondary | ICD-10-CM

## 2022-10-19 DIAGNOSIS — D259 Leiomyoma of uterus, unspecified: Secondary | ICD-10-CM

## 2022-10-19 DIAGNOSIS — Z862 Personal history of diseases of the blood and blood-forming organs and certain disorders involving the immune mechanism: Secondary | ICD-10-CM

## 2022-10-19 DIAGNOSIS — Z79899 Other long term (current) drug therapy: Secondary | ICD-10-CM | POA: Insufficient documentation

## 2022-10-19 DIAGNOSIS — Z01812 Encounter for preprocedural laboratory examination: Secondary | ICD-10-CM

## 2022-10-19 DIAGNOSIS — D251 Intramural leiomyoma of uterus: Secondary | ICD-10-CM

## 2022-10-19 HISTORY — DX: Anxiety disorder, unspecified: F41.9

## 2022-10-19 HISTORY — PX: HYSTEROSCOPY WITH D & C: SHX1775

## 2022-10-19 LAB — TYPE AND SCREEN
ABO/RH(D): A POS
Antibody Screen: NEGATIVE

## 2022-10-19 LAB — POCT PREGNANCY, URINE: Preg Test, Ur: NEGATIVE

## 2022-10-19 SURGERY — RADIOFREQUENCY ABLATION, LEIOMYOMA, UTERUS, TRANSCERVICAL APPROACH, WITH US GUIDANCE
Anesthesia: General | Site: Uterus

## 2022-10-19 MED ORDER — HYDROCODONE-ACETAMINOPHEN 5-325 MG PO TABS: 1.0000 | ORAL_TABLET | Freq: Four times a day (QID) | ORAL | 0 refills | Status: DC | PRN

## 2022-10-19 MED ORDER — MIDAZOLAM HCL 2 MG/2ML IJ SOLN
INTRAMUSCULAR | Status: DC | PRN
  Administered 2022-10-19: 2 mg via INTRAVENOUS

## 2022-10-19 MED ORDER — ACETAMINOPHEN 160 MG/5ML PO SOLN: 325.0000 mg | ORAL | Status: DC | PRN

## 2022-10-19 MED ORDER — MEPERIDINE HCL 25 MG/ML IJ SOLN: 6.2500 mg | INTRAMUSCULAR | Status: DC | PRN

## 2022-10-19 MED ORDER — FENTANYL CITRATE (PF) 100 MCG/2ML IJ SOLN
INTRAMUSCULAR | Status: AC
  Filled 2022-10-19: qty 2

## 2022-10-19 MED ORDER — OXYCODONE HCL 5 MG PO TABS: 5.0000 mg | ORAL_TABLET | Freq: Once | ORAL | Status: DC | PRN

## 2022-10-19 MED ORDER — PROPOFOL 10 MG/ML IV BOLUS
INTRAVENOUS | Status: AC
  Filled 2022-10-19: qty 20

## 2022-10-19 MED ORDER — OXYCODONE HCL 5 MG/5ML PO SOLN: 5.0000 mg | Freq: Once | ORAL | Status: DC | PRN

## 2022-10-19 MED ORDER — SILVER NITRATE-POT NITRATE 75-25 % EX MISC
CUTANEOUS | Status: DC | PRN
  Administered 2022-10-19: 1

## 2022-10-19 MED ORDER — CEFAZOLIN SODIUM-DEXTROSE 2-4 GM/100ML-% IV SOLN
INTRAVENOUS | Status: AC
  Filled 2022-10-19: qty 100

## 2022-10-19 MED ORDER — ONDANSETRON HCL 4 MG/2ML IJ SOLN: 4.0000 mg | Freq: Once | INTRAMUSCULAR | Status: DC | PRN

## 2022-10-19 MED ORDER — KETOROLAC TROMETHAMINE 30 MG/ML IJ SOLN
INTRAMUSCULAR | Status: AC
  Filled 2022-10-19: qty 3

## 2022-10-19 MED ORDER — MIDAZOLAM HCL 2 MG/2ML IJ SOLN
INTRAMUSCULAR | Status: AC
  Filled 2022-10-19: qty 2

## 2022-10-19 MED ORDER — LIDOCAINE 2% (20 MG/ML) 5 ML SYRINGE
INTRAMUSCULAR | Status: DC | PRN
  Administered 2022-10-19: 50 mg via INTRAVENOUS

## 2022-10-19 MED ORDER — LIDOCAINE-EPINEPHRINE 1 %-1:100000 IJ SOLN
INTRAMUSCULAR | Status: DC | PRN
  Administered 2022-10-19: 10 mL

## 2022-10-19 MED ORDER — POVIDONE-IODINE 10 % EX SWAB: 2.0000 | Freq: Once | CUTANEOUS | Status: DC

## 2022-10-19 MED ORDER — KETOROLAC TROMETHAMINE 30 MG/ML IJ SOLN
INTRAMUSCULAR | Status: DC | PRN
  Administered 2022-10-19: 30 mg via INTRAVENOUS

## 2022-10-19 MED ORDER — DEXAMETHASONE SODIUM PHOSPHATE 10 MG/ML IJ SOLN
INTRAMUSCULAR | Status: AC
  Filled 2022-10-19: qty 2

## 2022-10-19 MED ORDER — FENTANYL CITRATE (PF) 100 MCG/2ML IJ SOLN: 25.0000 ug | INTRAMUSCULAR | Status: DC | PRN

## 2022-10-19 MED ORDER — LACTATED RINGERS IV SOLN: INTRAVENOUS | Status: DC

## 2022-10-19 MED ORDER — SODIUM CHLORIDE 0.9 % IR SOLN
Status: DC | PRN
  Administered 2022-10-19: 3000 mL

## 2022-10-19 MED ORDER — ACETAMINOPHEN 325 MG PO TABS: 325.0000 mg | ORAL_TABLET | ORAL | Status: DC | PRN

## 2022-10-19 MED ORDER — ONDANSETRON HCL 4 MG/2ML IJ SOLN
INTRAMUSCULAR | Status: DC | PRN
  Administered 2022-10-19: 4 mg via INTRAVENOUS

## 2022-10-19 MED ORDER — KETOROLAC TROMETHAMINE 30 MG/ML IJ SOLN: 30.0000 mg | Freq: Once | INTRAMUSCULAR | Status: DC | PRN

## 2022-10-19 MED ORDER — LIDOCAINE HCL (PF) 2 % IJ SOLN
INTRAMUSCULAR | Status: AC
  Filled 2022-10-19: qty 5

## 2022-10-19 MED ORDER — CEFAZOLIN SODIUM-DEXTROSE 2-4 GM/100ML-% IV SOLN
2.0000 g | INTRAVENOUS | Status: AC
  Administered 2022-10-19: 2 g via INTRAVENOUS

## 2022-10-19 MED ORDER — ONDANSETRON HCL 4 MG/2ML IJ SOLN
INTRAMUSCULAR | Status: AC
  Filled 2022-10-19: qty 4

## 2022-10-19 MED ORDER — DEXAMETHASONE SODIUM PHOSPHATE 10 MG/ML IJ SOLN
INTRAMUSCULAR | Status: DC | PRN
  Administered 2022-10-19: 10 mg via INTRAVENOUS

## 2022-10-19 MED ORDER — FENTANYL CITRATE (PF) 250 MCG/5ML IJ SOLN
INTRAMUSCULAR | Status: DC | PRN
  Administered 2022-10-19: 50 ug via INTRAVENOUS
  Administered 2022-10-19 (×3): 25 ug via INTRAVENOUS

## 2022-10-19 MED ORDER — PROPOFOL 10 MG/ML IV BOLUS
INTRAVENOUS | Status: DC | PRN
  Administered 2022-10-19: 180 mg via INTRAVENOUS

## 2022-10-19 SURGICAL SUPPLY — 22 items
CATH ROBINSON RED A/P 16FR (CATHETERS) ×1 IMPLANT
DEVICE MYOSURE LITE (MISCELLANEOUS) IMPLANT
DEVICE MYOSURE REACH (MISCELLANEOUS) IMPLANT
DILATOR CANAL MILEX (MISCELLANEOUS) IMPLANT
DRSG TELFA 3X8 NADH STRL (GAUZE/BANDAGES/DRESSINGS) ×1 IMPLANT
ELECT DISPERSIVE SONATA (ELECTRODE) ×2 IMPLANT
GAUZE 4X4 16PLY ~~LOC~~+RFID DBL (SPONGE) ×2 IMPLANT
GLOVE BIOGEL PI IND STRL 7.0 (GLOVE) ×1 IMPLANT
GLOVE ECLIPSE 6.5 STRL STRAW (GLOVE) ×2 IMPLANT
GOWN STRL REUS W/TWL LRG LVL3 (GOWN DISPOSABLE) ×2 IMPLANT
HANDPIECE RFA SONATA (MISCELLANEOUS) ×1 IMPLANT
IV NS IRRIG 3000ML ARTHROMATIC (IV SOLUTION) ×1 IMPLANT
KIT PROCEDURE FLUENT (KITS) ×1 IMPLANT
KIT TURNOVER CYSTO (KITS) ×1 IMPLANT
LOOP CUTTING BIPOLAR 21FR (ELECTRODE) IMPLANT
PACK VAGINAL MINOR WOMEN LF (CUSTOM PROCEDURE TRAY) ×1 IMPLANT
PAD OB MATERNITY 4.3X12.25 (PERSONAL CARE ITEMS) ×1 IMPLANT
SEAL CERVICAL OMNI LOK (ABLATOR) IMPLANT
SEAL ROD LENS SCOPE MYOSURE (ABLATOR) IMPLANT
SLEEVE SCD COMPRESS KNEE MED (STOCKING) ×1 IMPLANT
TOWEL OR 17X24 6PK STRL BLUE (TOWEL DISPOSABLE) ×1 IMPLANT
WATER STERILE IRR 500ML POUR (IV SOLUTION) ×1 IMPLANT

## 2022-10-19 NOTE — Anesthesia Postprocedure Evaluation (Signed)
Anesthesia Post Note  Patient: [name]  Procedure(s) Performed: Hysteroscopy, dilation and curettage, Radio Frequency Ablation of fibroids with Sonata (Uterus)     Patient location during evaluation: Phase II Anesthesia Type: General Level of consciousness: awake Pain management: pain level controlled Vital Signs Assessment: post-procedure vital signs reviewed and stable Respiratory status: spontaneous breathing Cardiovascular status: stable Postop Assessment: no apparent nausea or vomiting Anesthetic complications: no  No notable events documented.  Last Vitals:  Vitals:   10/19/22 0919 10/19/22 0920  BP: (!) 146/93 (!) 149/100  Pulse: 96 86  Resp: 17 16  Temp:    SpO2: 99% 99%    Last Pain:  Vitals:   10/19/22 0920  TempSrc:   PainSc: 0-No pain                 Huston Foley

## 2022-10-19 NOTE — Op Note (Signed)
10/19/2022  9:09 AM  PATIENT:  [name]  37 y.o. female  PRE-OPERATIVE DIAGNOSIS:  Fibroids, Menorhagia  POST-OPERATIVE DIAGNOSIS:  Fibroids, Menorhagia  PROCEDURE:  Procedure(s): Hysteroscopy, dilation and curettage, Radio Frequency Ablation of fibroids with Sonata  SURGEON:  Megan Salon  ASSISTANTS: OR staff.    ANESTHESIA:   general  ESTIMATED BLOOD LOSS: 10 mL  BLOOD ADMINISTERED:none   FLUIDS: 600cc LR  UOP: voided before going back to the OR  SPECIMEN:  endometrial curetting  DISPOSITION OF SPECIMEN:  PATHOLOGY  FINDINGS: normal endometrial cavity with hysteroscopy  DESCRIPTION OF OPERATION: Patient was taken to the operating room.  She is placed in the supine position. SCDs were on her lower extremities and functioning properly. General anesthesia with an LMA was administered without difficulty. Dr. Clide Cliff, anesthesia, oversaw case.  Legs were then placed in the [place] in the low lithotomy position. The legs were lifted to the high lithotomy position and the Betadine prep was used on the inner thighs perineum and vagina x3. Patient was draped in a normal standard fashion.  A bivalve speculum was placed the vagina. The anterior lip of the cervix was grasped with single-tooth tenaculum.  A paracervical block of 1% lidocaine mixed one-to-one with epinephrine (1:100,000 units).  10 cc was used total. The cervix is dilated up to #21 [facility] dilators. The endometrial cavity sounded to 9 cm.  A diagnostic hysteroscope was obtained. Normal saline was used as a hysteroscopic fluid. The hysteroscope was advanced through the endocervical canal into the endometrial cavity. The tubal ostia were noted bilaterally.  The hysteroscope was removed. A #1 toothed curette was used to curette the cavity until rough gritty texture was noted in all quadrants. The fluid deficit was 85 cc.   The cervix was then dilated up to a #27.  Then the Sonata handpiece was inserted and a  complete uterine survey was performed with the ultrasound.  Sonata fibroid ablation was then performed.  Mid, posterior fundal fibroids measuring 4.8cm, type 2-5 was treated with four ablation cycles. These measured 2.3 x 1.6cm (88mn and 42sec), 2.8 x 2.0cm (2 mine and 36sec), 3.6 x 2.7cm (4 min and 12 sec) and 3.3 x 2.4cm (329m and 30sec).  All cycles were carried out under direct ultrasound intrauterine guidance and visualization with the ablation guide noted to be within the serosa at all times.  The fibroid treated appeared ablated with USKoreauidance with outgassing noted by ultrasound appearance.  At this point, the procedure was ended.  The sonata device was removed without difficulty.  The tenaculum was removed from the anterior lip of the cervix. Silver nitrate used to stop bleeding at the tenaculum site.  The speculum was removed from the vagina. The prep was cleansed of the patient's skin. The legs are positioned back in the supine position. Sponge, lap, needle, instrument counts were correct x2. Patient was taken to recovery in stable condition.   COUNTS:  YES  PLAN OF CARE: Transfer to PACU

## 2022-10-19 NOTE — Transfer of Care (Signed)
Immediate Anesthesia Transfer of Care Note  Patient: [name]  Procedure(s) Performed: Hysteroscopy, dilation and curettage, Radio Frequency Ablation of fibroids with Sonata (Uterus)  Patient Location: PACU  Anesthesia Type:General  Level of Consciousness: drowsy and patient cooperative  Airway & Oxygen Therapy: Patient Spontanous Breathing  Post-op Assessment: Report given to RN and Post -op Vital signs reviewed and stable  Post vital signs: Reviewed and stable  Last Vitals:  Vitals Value Taken Time  BP 139/82 10/19/22 0850  Temp    Pulse 89 10/19/22 0852  Resp 13 10/19/22 0852  SpO2 100 % 10/19/22 0852  Vitals shown include unvalidated device data.  Last Pain:  Vitals:   10/19/22 0554  TempSrc: Oral  PainSc: 0-No pain      Patients Stated Pain Goal: 6 ([id number])  Complications: No notable events documented.

## 2022-10-19 NOTE — Progress Notes (Signed)
GYNECOLOGY  VISIT  CC:   discuss upcoming surgery  HPI: 37 y.o. [phone] Divorced Black or [place] American female here for discussion of surgery.  She does want to proceed with Sonata procedure and not myomectomy.  Ultrasound was recommended pre op but not done so here to have this done in the office.  Bleeding can be irregular but typically months and can by heavy at times.  She is scheduled for IUD removal as well.  Procedure discussed with patient.  Recovery and pain management discussed.  Risks discussed including but not limited to bleeding, rare risk of transfusion, infection, 1% risk of uterine perforation with risks of fluid deficit causing cardiac arrythmia, cerebral swelling and/or need to stop procedure early.  Fluid emboli and rare risk of death discussed.  DVT/PE, rare risk of risk of bowel/bladder/ureteral/vascular injury.  Patient aware if pathology abnormal she may need additional treatment.  All questions answered.    Past Medical History:  Diagnosis Date   Anemia    Follows w/ PCP, Dr. Andrena Mews @ [facility].   Anxiety    DUB (dysfunctional uterine bleeding) 2023   Headache    chronic migraines, follows with Metta Clines, MD, neurology   HSV (herpes simplex virus) infection    Hx of chlamydia infection    Hx of gonorrhea     MEDS:   Current Facility-Administered Medications on File Prior to Visit  Medication Dose Route Frequency Provider Last Rate Last Admin   ceFAZolin (ANCEF) IVPB 2g/100 mL premix  2 g Intravenous On Call to OR Megan Salon, MD       lactated ringers infusion   Intravenous Continuous Roderic Palau, MD 50 mL/hr at 10/19/22 0604 New Bag at 10/19/22 0604   povidone-iodine 10 % swab 2 Application  2 Application Topical Once Megan Salon, MD       Current Outpatient Medications on File Prior to Visit  Medication Sig Dispense Refill   albuterol (VENTOLIN HFA) 108 (90 Base) MCG/ACT inhaler INHALE 1 TO 2 PUFFS INTO THE LUNGS  EVERY 6 HOURS AS NEEDED FOR WHEEZING OR SHORTNESS OF BREATH Strength: 108 (90 Base) MCG/ACT (Patient taking differently: as needed. INHALE 1 TO 2 PUFFS INTO THE LUNGS EVERY 6 HOURS AS NEEDED FOR WHEEZING OR SHORTNESS OF BREATH Strength: 108 (90 Base) MCG/ACT) 8 g 1   amLODipine (NORVASC) 5 MG tablet Take 1 tablet (5 mg total) by mouth at bedtime. 90 tablet 0   Blood Pressure Monitoring (BLOOD PRESSURE CUFF) MISC 1 Units by Does not apply route daily. Upper arm cuff (Patient not taking: Reported on 10/17/2022) 1 each 0   calcium carbonate (TUMS - DOSED IN MG ELEMENTAL CALCIUM) 500 MG chewable tablet Chew 1-2 tablets by mouth 2 (two) times daily as needed.     eletriptan (RELPAX) 40 MG tablet Take 1 tablet (40 mg total) by mouth as needed for migraine or headache. May repeat in 2 hours if headache persists or recurs. 10 tablet 5   Erenumab-aooe (AIMOVIG) 140 MG/ML SOAJ Inject 140 mg into the skin every 28 (twenty-eight) days. (Patient not taking: Reported on 10/17/2022) 1.12 mL 11   ferrous sulfate 325 (65 FE) MG tablet Take 1 tablet (325 mg total) by mouth every other day. (Patient not taking: Reported on 10/17/2022) 30 tablet 0   hydrOXYzine (ATARAX) 25 MG tablet Take 1 tablet (25 mg total) by mouth 3 (three) times daily as needed for anxiety. 30 tablet 3   megestrol (MEGACE) 40  MG tablet Take 1 tablet (40 mg total) by mouth 2 (two) times daily. 60 tablet 5   nystatin cream (MYCOSTATIN) Apply 1 Application topically 2 (two) times daily. 30 g 0   ondansetron (ZOFRAN-ODT) 4 MG disintegrating tablet Take 1 tablet (4 mg total) by mouth every 8 (eight) hours as needed for nausea or vomiting. 20 tablet 0   Ubrogepant (UBRELVY) 100 MG TABS Take 100 mg by mouth as needed (take 1 tab at the earlist onset of a Migraine, May repeat in 2 hour. Max 2 tabs in 24 hours). 16 tablet 5    ALLERGIES: Coconut flavor, Other, and Tomato  SH:  divorced, non smoker  Review of Systems  Constitutional: Negative.    Genitourinary:        Heavy bleeding    PHYSICAL EXAMINATION:    There were no vitals taken for this visit.    General appearance: alert, cooperative and appears stated age  Pelvic: External genitalia:  no lesions              Urethra:  normal appearing urethra with no masses, tenderness or lesions              Bartholins and Skenes: normal                 Vagina: normal appearing vagina with normal color and discharge, no lesions              Cervix: no lesions              Bimanual Exam:  Uterus:  enlarged, about 10 weeks size              Adnexa: no mass, fullness, tenderness  Bedside ultrasound showed posterior 5cm fibroid.  No IUD present.  Chaperone, Ezekiel Ina, RN, was present for exam.  Assessment/Plan: 1. Menorrhagia with regular cycle - surgery will be changed to [facility] and hysteroscopy with possible IUD removal, D&C.    2. Intrauterine contraceptive device threads lost, initial encounter - ultrasound does not show IUD present today.  Will have pt go for x ray today to confirm - DG Abd 1 View; Future

## 2022-10-19 NOTE — H&P (Signed)
[name] is an 37 y.o. female [phone] Divorced AA female here for sonata treatment of uterine fibroid(s) and hysteroscopy with D&C.  Pt did have IUD placed in the past but given recent bleeding changes, u/s in office performed this week and no IUD was noted.  Abdominal x ray was ordered and IUD was not present in this location either.  IUD removal is still on her consent form in case this was obscured by the fibroid but pt is aware I am confident this came out in the past.  Procedure, risks, alternatives have all been reviewed.  She is most interested in surgery that does not involve incisions so sonata planned.  Her mother is with her today.  Questions answered. Pt here and ready to proceed.  Pertinent Gynecological History: Menses:  heavy with clotting, but under good control with megace Contraception: tubal ligation DES exposure: denies Blood transfusions: none Sexually transmitted diseases: past history: gonorrhea, chlamydia and HSV Previous GYN Procedures:  tubal ligation   Last mammogram:  n/a   Last pap: normal Date: 07/07/2022 OB History: G7, P6   Menstrual History: No LMP recorded. (Menstrual status: IUD).    Past Medical History:  Diagnosis Date   Anemia    Follows w/ PCP, Dr. Andrena Mews @ [facility].   Anxiety    DUB (dysfunctional uterine bleeding) 2023   Headache    chronic migraines, follows with Metta Clines, MD, neurology   HSV (herpes simplex virus) infection    Hx of chlamydia infection    Hx of gonorrhea     Past Surgical History:  Procedure Laterality Date   LAPAROSCOPIC TUBAL LIGATION Bilateral 11/14/2016   Procedure: LAPAROSCOPIC TUBAL LIGATION;  Surgeon: Paula Compton, MD;  Location: [facility];  Service: Gynecology;  Laterality: Bilateral;    Family History  Problem Relation Age of Onset   Hypertension Mother    Stroke Mother    Diabetes Paternal Grandfather    Alcohol abuse Maternal Uncle    Drug abuse Maternal Uncle     Asthma Paternal Uncle    Asthma Child     Social History:  reports that she has never smoked. She has never used smokeless tobacco. She reports that she does not currently use alcohol. She reports that she does not use drugs.  Allergies:  Allergies  Allergen Reactions   Coconut Flavor Itching and Swelling   Other Swelling    Sour Cream and Onion together cause lip swelling.   Tomato Swelling and Rash    Skin contact with tomato causes rash and swelling. Patient can eat tomatoes.    Medications Prior to Admission  Medication Sig Dispense Refill Last Dose   amLODipine (NORVASC) 5 MG tablet Take 1 tablet (5 mg total) by mouth at bedtime. 90 tablet 0 10/18/2022   calcium carbonate (TUMS - DOSED IN MG ELEMENTAL CALCIUM) 500 MG chewable tablet Chew 1-2 tablets by mouth 2 (two) times daily as needed.   10/18/2022   eletriptan (RELPAX) 40 MG tablet Take 1 tablet (40 mg total) by mouth as needed for migraine or headache. May repeat in 2 hours if headache persists or recurs. 10 tablet 5 10/18/2022   Ubrogepant (UBRELVY) 100 MG TABS Take 100 mg by mouth as needed (take 1 tab at the earlist onset of a Migraine, May repeat in 2 hour. Max 2 tabs in 24 hours). 16 tablet 5 Past Month   albuterol (VENTOLIN HFA) 108 (90 Base) MCG/ACT inhaler INHALE 1 TO 2 PUFFS  INTO THE LUNGS EVERY 6 HOURS AS NEEDED FOR WHEEZING OR SHORTNESS OF BREATH Strength: 108 (73 Base) MCG/ACT (Patient taking differently: as needed. INHALE 1 TO 2 PUFFS INTO THE LUNGS EVERY 6 HOURS AS NEEDED FOR WHEEZING OR SHORTNESS OF BREATH Strength: 108 (90 Base) MCG/ACT) 8 g 1 More than a month   Blood Pressure Monitoring (BLOOD PRESSURE CUFF) MISC 1 Units by Does not apply route daily. Upper arm cuff (Patient not taking: Reported on 10/17/2022) 1 each 0    Erenumab-aooe (AIMOVIG) 140 MG/ML SOAJ Inject 140 mg into the skin every 28 (twenty-eight) days. (Patient not taking: Reported on 10/17/2022) 1.12 mL 11 More than a month   ferrous sulfate 325 (65 FE)  MG tablet Take 1 tablet (325 mg total) by mouth every other day. (Patient not taking: Reported on 10/17/2022) 30 tablet 0 More than a month   hydrOXYzine (ATARAX) 25 MG tablet Take 1 tablet (25 mg total) by mouth 3 (three) times daily as needed for anxiety. 30 tablet 3 10/17/2022   megestrol (MEGACE) 40 MG tablet Take 1 tablet (40 mg total) by mouth 2 (two) times daily. 60 tablet 5 10/17/2022   nystatin cream (MYCOSTATIN) Apply 1 Application topically 2 (two) times daily. 30 g 0 More than a month   ondansetron (ZOFRAN-ODT) 4 MG disintegrating tablet Take 1 tablet (4 mg total) by mouth every 8 (eight) hours as needed for nausea or vomiting. 20 tablet 0 10/15/2022    Review of Systems  All other systems reviewed and are negative.   Blood pressure (!) 144/98, pulse 79, temperature 98.3 F (36.8 C), temperature source Oral, resp. rate 16, height 5' (1.524 m), weight 83.1 kg, SpO2 98 %. Physical Exam Constitutional:      Appearance: Normal appearance.  Cardiovascular:     Rate and Rhythm: Normal rate and regular rhythm.  Pulmonary:     Effort: Pulmonary effort is normal.     Breath sounds: Normal breath sounds.  Neurological:     General: No focal deficit present.     Mental Status: She is alert.  Psychiatric:        Mood and Affect: Mood normal.     Results for orders placed or performed during the hospital encounter of 10/19/22 (from the past 24 hour(s))  Pregnancy, urine POC     Status: None   Collection Time: 10/19/22  5:36 AM  Result Value Ref Range   Preg Test, Ur NEGATIVE NEGATIVE    DG Abd 1 View  Result Date: 10/18/2022 CLINICAL DATA:  Nonvisualization of IUD on ultrasound EXAM: ABDOMEN - 1 VIEW COMPARISON:  Pelvic ultrasound 11/14/2021 FINDINGS: The bowel gas pattern is normal. IUD is not visualized. No radio-opaque calculi or other significant radiographic abnormality are seen. IMPRESSION: IUD is not visualized. Normal bowel-gas pattern. Electronically Signed   By: Ronney Asters M.D.   On: 10/18/2022 22:55    Assessment/Plan: 37 yo G7 P6 DAAF here for Sonata treatment of uterine fibroids and hysteroscopy with D&C.  If IUD is present within endometrium, this will be removed as well.  Questions answered.  Pt ready to proceed.  Megan Salon 10/19/2022, 7:14 AM

## 2022-10-19 NOTE — Discharge Instructions (Addendum)
DISCHARGE INSTRUCTIONS: HYSTEROSCOPY / ENDOMETRIAL ABLATION The following instructions have been prepared to help you care for yourself upon your return home.  May Remove Scop patch on or before  May take Ibuprofen after 2:30 pm today, if needed.  May take stool softner while taking narcotic pain medication to prevent constipation.  Drink plenty of water.  Personal hygiene:  Use sanitary pads for vaginal drainage, not tampons.  Shower the day after your procedure.  NO tub baths, pools or Jacuzzis for 2-3 weeks.  Wipe front to back after using the bathroom.  Activity and limitations:  Do NOT drive or operate any equipment for 24 hours. The effects of anesthesia are still present and drowsiness may result.  Do NOT rest in bed all day.  Walking is encouraged.  Walk up and down stairs slowly.  You may resume your normal activity in one to two days or as indicated by your physician. Sexual activity: NO intercourse for at least 2 weeks after the procedure, or as indicated by your Doctor.  Diet: Eat a light meal as desired this evening. You may resume your usual diet tomorrow.  Return to Work: You may resume your work activities in one to two days or as indicated by Marine scientist.  What to expect after your surgery: Expect to have vaginal bleeding/discharge for 2-3 days and spotting for up to 10 days. It is not unusual to have soreness for up to 1-2 weeks. You may have a slight burning sensation when you urinate for the first day. Mild cramps may continue for a couple of days. You may have a regular period in 2-6 weeks.  Call your doctor for any of the following:  Excessive vaginal bleeding or clotting, saturating and changing one pad every hour.  Inability to urinate 6 hours after discharge from hospital.  Pain not relieved by pain medication.  Fever of 100.4 F or greater.  Unusual vaginal discharge or odor.      Post Anesthesia Home Care Instructions  Activity: Get plenty  of rest for the remainder of the day. A responsible individual must stay with you for 24 hours following the procedure.  For the next 24 hours, DO NOT: -Drive a car -Paediatric nurse -Drink alcoholic beverages -Take any medication unless instructed by your physician -Make any legal decisions or sign important papers.  Meals: Start with liquid foods such as gelatin or soup. Progress to regular foods as tolerated. Avoid greasy, spicy, heavy foods. If nausea and/or vomiting occur, drink only clear liquids until the nausea and/or vomiting subsides. Call your physician if vomiting continues.  Special Instructions/Symptoms: Your throat may feel dry or sore from the anesthesia or the breathing tube placed in your throat during surgery. If this causes discomfort, gargle with warm salt water. The discomfort should disappear within 24 hours.  Do not take any nonsteroidal anti inflammatories until after 2:30 pm today.  Post-surgical Instructions, Outpatient Surgery  You may expect to feel dizzy, weak, and drowsy for as long as 24 hours after receiving the medicine that made you sleep (anesthetic). For the first 24 hours after your surgery:   Do not drive a car, ride a bicycle, participate in physical activities, or take public transportation until you are done taking narcotic pain medicines or as directed by Dr. Sabra Heck.  Do not drink alcohol or take tranquilizers.  Do not take medicine that has not been prescribed by your physicians.  Do not sign important papers or make important decisions while on narcotic  pain medicines.  Have a responsible person with you.   PAIN MANAGEMENT Vicodin 5/'325mg'$ .  For more severe pain, take one or two tablets every four to six hours as needed for pain control.  (Remember that narcotic pain medications increase your risk of constipation.  If this becomes a problem, you may take an over the counter stool softener like Colace '100mg'$  up to four times a day.)  DO'S AND  DON'T'S Do not take a tub bath for one week.  You may shower on the first day after your surgery Do not do any heavy lifting for one to two weeks.  This increases the chance of bleeding. Do move around as you feel able.  Stairs are fine.  You may begin to exercise again as you feel able.  Do not lift any weights for two weeks. Do not put anything in the vagina for two weeks--no tampons, intercourse, or douching.    REGULAR MEDIATIONS/VITAMINS: You may restart all of your regular medications as prescribed. You may restart all of your vitamins as you normally take them.    PLEASE CALL OR SEEK MEDICAL CARE IF: You have persistent nausea and vomiting.  You have trouble eating or drinking.  You have an oral temperature above 100.5.  You have constipation that is not helped by adjusting diet or increasing fluid intake. Pain medicines are a common cause of constipation.  You have heavy vaginal bleeding

## 2022-10-19 NOTE — Anesthesia Procedure Notes (Signed)
Procedure Name: LMA Insertion Date/Time: 10/19/2022 7:30 AM  Performed by: Clearnce Sorrel, CRNAPre-anesthesia Checklist: Patient identified, Emergency Drugs available, Suction available and Patient being monitored Patient Re-evaluated:Patient Re-evaluated prior to induction Oxygen Delivery Method: Circle System Utilized Preoxygenation: Pre-oxygenation with 100% oxygen Induction Type: IV induction Ventilation: Mask ventilation without difficulty LMA: LMA inserted LMA Size: 4.0 Number of attempts: 1 Airway Equipment and Method: Bite block Placement Confirmation: positive ETCO2 Tube secured with: Tape Dental Injury: Teeth and Oropharynx as per pre-operative assessment

## 2022-10-19 NOTE — Anesthesia Preprocedure Evaluation (Signed)
Anesthesia Evaluation  Patient identified by MRN, date of birth, ID band Patient awake    Reviewed: Allergy & Precautions, NPO status , Patient's Chart, lab work & pertinent test results  Airway Mallampati: I       Dental no notable dental hx.    Pulmonary neg pulmonary ROS   Pulmonary exam normal        Cardiovascular hypertension, Pt. on medications Normal cardiovascular exam     Neuro/Psych    GI/Hepatic negative GI ROS, Neg liver ROS,,,  Endo/Other  negative endocrine ROS    Renal/GU negative Renal ROS  negative genitourinary   Musculoskeletal negative musculoskeletal ROS (+)    Abdominal Normal abdominal exam  (+)   Peds  Hematology   Anesthesia Other Findings   Reproductive/Obstetrics negative OB ROS                             Anesthesia Physical Anesthesia Plan  ASA: 2  Anesthesia Plan: General   Post-op Pain Management: Minimal or no pain anticipated   Induction:   PONV Risk Score and Plan: 3 and Ondansetron, Dexamethasone and Midazolam  Airway Management Planned: LMA  Additional Equipment: None  Intra-op Plan:   Post-operative Plan: Extubation in OR  Informed Consent: I have reviewed the patients History and Physical, chart, labs and discussed the procedure including the risks, benefits and alternatives for the proposed anesthesia with the patient or authorized representative who has indicated his/her understanding and acceptance.     Dental advisory given  Plan Discussed with: CRNA  Anesthesia Plan Comments:        Anesthesia Quick Evaluation

## 2022-10-20 LAB — SURGICAL PATHOLOGY

## 2022-10-20 NOTE — Telephone Encounter (Signed)
Patient advised.

## 2022-10-20 NOTE — Telephone Encounter (Signed)
Patient Advocate Encounter  Prior Authorization for Aimovig '140MG'$ /ML auto-injectors has been approved.    PA# [id number] Key: [place] Electronic [place] Form Effective dates: 10/20/2022 through 10/20/2023      Lyndel Safe, [place] Patient Advocate Specialist [place] Patient Advocate Team Direct Number: [phone]  Fax: [phone]

## 2022-10-26 ENCOUNTER — Encounter (HOSPITAL_BASED_OUTPATIENT_CLINIC_OR_DEPARTMENT_OTHER): Payer: Self-pay | Admitting: Obstetrics & Gynecology

## 2022-10-26 ENCOUNTER — Other Ambulatory Visit (HOSPITAL_BASED_OUTPATIENT_CLINIC_OR_DEPARTMENT_OTHER): Payer: Self-pay | Admitting: Obstetrics & Gynecology

## 2022-10-26 DIAGNOSIS — G8918 Other acute postprocedural pain: Secondary | ICD-10-CM

## 2022-10-26 MED ORDER — GABAPENTIN 100 MG PO CAPS: ORAL_CAPSULE | ORAL | 0 refills | Status: DC

## 2022-10-26 MED ORDER — IBUPROFEN 800 MG PO TABS: 800.0000 mg | ORAL_TABLET | Freq: Three times a day (TID) | ORAL | 1 refills | Status: DC | PRN

## 2022-10-31 ENCOUNTER — Encounter (HOSPITAL_BASED_OUTPATIENT_CLINIC_OR_DEPARTMENT_OTHER): Payer: Self-pay | Admitting: Obstetrics & Gynecology

## 2022-11-02 ENCOUNTER — Other Ambulatory Visit (HOSPITAL_COMMUNITY): Payer: Self-pay
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
## 2022-11-15 ENCOUNTER — Ambulatory Visit: Payer: Medicaid Other | Admitting: Neurology

## 2022-11-15 ENCOUNTER — Encounter: Payer: Self-pay | Admitting: Neurology

## 2022-11-15 VITALS — BP 134/84 | HR 83 | Resp 18 | Ht 62.0 in | Wt 190.0 lb

## 2022-11-15 DIAGNOSIS — G43101 Migraine with aura, not intractable, with status migrainosus: Secondary | ICD-10-CM | POA: Diagnosis not present

## 2022-11-15 MED ORDER — UBRELVY 100 MG PO TABS: 100.0000 mg | ORAL_TABLET | ORAL | 5 refills | Status: DC | PRN

## 2022-11-15 MED ORDER — SUMATRIPTAN SUCCINATE 100 MG PO TABS: ORAL_TABLET | ORAL | 5 refills | Status: DC

## 2022-11-15 NOTE — Patient Instructions (Signed)
Aimovig 140mg  every 4 weeks Ubrelvy 100mg  as needed.  Until it is approved, may use sumatriptan as directed Follow up 5 months.

## 2022-11-16 ENCOUNTER — Telehealth: Payer: Self-pay

## 2022-11-16 ENCOUNTER — Other Ambulatory Visit (HOSPITAL_COMMUNITY): Payer: Self-pay

## 2022-11-16 NOTE — Telephone Encounter (Signed)
Patient Advocate Encounter  Prior Authorization for [name] 100MG  tablet has been approved through [employer] [place].    Key: [id number]  Effective: 11-16-2022 to 11-15-2023

## 2022-11-16 NOTE — Telephone Encounter (Signed)
Patient Advocate Encounter   Received notification from [facility] [place] that prior authorization is required for Eletriptan Hydrobromide 40MG  tablets   Submitted: 11-16-2022 Key [id number]  Status is pending

## 2022-11-16 NOTE — Telephone Encounter (Signed)
Patient Advocate Encounter  Prior Authorization for Eletriptan Hydrobromide 40MG  tablets has been  approved through [employer] [place].    [name]  Effective: 11-16-2022 to 11-16-2023   This was approved for a maximum of 12 tablets every 22 days.   Per test claim, last filled at [facility] 11-11-22 and next fill is 12-03-2022. Also states the maximum limit of 12 has been reached for this month.

## 2022-11-16 NOTE — Telephone Encounter (Signed)
Patient Advocate Encounter   Received notification from [facility] [place] that prior authorization is required for Ubrelvy 100MG  tablets   Submitted: 11-16-2022 Key [id number]  Status is pending

## 2022-11-17 ENCOUNTER — Encounter (HOSPITAL_BASED_OUTPATIENT_CLINIC_OR_DEPARTMENT_OTHER): Payer: Self-pay | Admitting: Obstetrics & Gynecology

## 2022-11-17 ENCOUNTER — Ambulatory Visit (INDEPENDENT_AMBULATORY_CARE_PROVIDER_SITE_OTHER): Payer: Medicaid Other | Admitting: Family Medicine

## 2022-11-17 ENCOUNTER — Ambulatory Visit (HOSPITAL_BASED_OUTPATIENT_CLINIC_OR_DEPARTMENT_OTHER): Payer: Medicaid Other | Admitting: Obstetrics & Gynecology

## 2022-11-17 ENCOUNTER — Other Ambulatory Visit: Payer: Self-pay | Admitting: Neurology

## 2022-11-17 VITALS — BP 121/85 | HR 87 | Ht 62.0 in | Wt 187.4 lb

## 2022-11-17 VITALS — BP 130/86 | HR 88 | Ht 62.0 in | Wt 189.0 lb

## 2022-11-17 DIAGNOSIS — Z9889 Other specified postprocedural states: Secondary | ICD-10-CM | POA: Diagnosis not present

## 2022-11-17 DIAGNOSIS — R102 Pelvic and perineal pain: Secondary | ICD-10-CM | POA: Diagnosis not present

## 2022-11-17 DIAGNOSIS — N898 Other specified noninflammatory disorders of vagina: Secondary | ICD-10-CM

## 2022-11-17 DIAGNOSIS — N939 Abnormal uterine and vaginal bleeding, unspecified: Secondary | ICD-10-CM

## 2022-11-17 DIAGNOSIS — R519 Headache, unspecified: Secondary | ICD-10-CM

## 2022-11-17 DIAGNOSIS — J020 Streptococcal pharyngitis: Secondary | ICD-10-CM | POA: Diagnosis not present

## 2022-11-17 LAB — POCT RAPID STREP A (OFFICE): Rapid Strep A Screen: POSITIVE — AB

## 2022-11-17 LAB — CBC
Hematocrit: 38.5 % (ref 34.0–46.6)
Hemoglobin: 12.9 g/dL (ref 11.1–15.9)
MCH: 29.5 pg (ref 26.6–33.0)
MCHC: 33.5 g/dL (ref 31.5–35.7)
MCV: 88 fL (ref 79–97)
Platelets: 314 10*3/uL (ref 150–450)
RBC: 4.38 x10E6/uL (ref 3.77–5.28)
RDW: 12.4 % (ref 11.7–15.4)
WBC: 7.7 10*3/uL (ref 3.4–10.8)

## 2022-11-17 LAB — POC SOFIA 2 FLU + SARS ANTIGEN FIA
Influenza A, POC: NEGATIVE
Influenza B, POC: NEGATIVE
SARS Coronavirus 2 Ag: NEGATIVE

## 2022-11-17 MED ORDER — AMOXICILLIN 500 MG PO TABS: 500.0000 mg | ORAL_TABLET | Freq: Two times a day (BID) | ORAL | 0 refills | Status: AC

## 2022-11-17 MED ORDER — MEGESTROL ACETATE 40 MG PO TABS: 40.0000 mg | ORAL_TABLET | Freq: Two times a day (BID) | ORAL | 3 refills | Status: DC

## 2022-11-17 MED ORDER — MEGESTROL ACETATE 40 MG PO TABS: 40.0000 mg | ORAL_TABLET | Freq: Every day | ORAL | 1 refills | Status: DC

## 2022-11-17 MED ORDER — METRONIDAZOLE 500 MG PO TABS: 500.0000 mg | ORAL_TABLET | Freq: Two times a day (BID) | ORAL | 0 refills | Status: DC

## 2022-11-17 NOTE — Patient Instructions (Addendum)
It was great to see you today! Here's what we talked about:  You have strep throat. I have sent in amoxicillin to take daily for 10 days. Be sure to return to care should you continue to get worse, cannot eat or drink due to the pain, or having difficulty breathing or have a very muffled voice.  Please let me know if you have any other questions.  Dr. Marcha Dutton

## 2022-11-17 NOTE — Progress Notes (Signed)
    SUBJECTIVE:   CHIEF COMPLAINT / HPI:   Sore throat and R ear pain Started this morning when she had woken up. Feels like something is in her ear, but the pain does not last all the time. No cough. Some SOB while working today and moving around. No chest pain. No fevers that she knows of. Has been a little nauseous, but she takes zofran that she has had in the cabinet. Had also been constipated recently. Has been drinking warm tea to soothe. Has been taking normal migraine medicines but no pain meds. Has only been able to eat crackers because it hurts. Nobody else in the home sick. 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  OBJECTIVE:   BP 130/86   Pulse 88   Ht 5\' 2"  (1.575 m)   Wt 189 lb (85.7 kg)   SpO2 100%   BMI 34.57 kg/m   General: Alert and oriented, in NAD Skin: Warm, dry, and intact without obvious lesions HEENT: NCAT, EOM grossly normal, midline nasal septum, bilateral tonsillar exudates with mild hypertrophy, no uvular midline shift, no cervical LAD, bilateral TM normal Cardiac: RRR, no m/r/g appreciated Respiratory: CTAB, breathing and speaking comfortably on RA Extremities: Moves all extremities grossly equally Neurological: No gross focal deficit Psychiatric: Appropriate mood and affect   ASSESSMENT/PLAN:   Strep throat History, exam, and POC testing suggestive of strep throat. Reassured against peritonsillar abscess, otitis, or pulmonary pathology based on exam. Will send in amoxicillin 500 mg BID for 10 days. Return precautions discussed should she continue to worsen.  Ethelene Hal, MD [place]

## 2022-11-19 NOTE — Progress Notes (Signed)
GYNECOLOGY  VISIT  CC:   bleeding  HPI: 37 y.o. [id number] Divorced Black or [place] American female here for bleeding that has been occurring since surgical procedure.  Has not been heavy except when needs to do lifting with work.  Then can be heavy with passing clots.  Having some cramping with this as well.  Gabapentin has helped.  Has been advised not to take tylenol or motrin.  Dosages discussed.  Discussed restarting megace.  She initially reported she didn't start it but her mother, who accompanied her today, states she did but needs RF.  Dosage discussed.  Will increase to BID.     Past Medical History:  Diagnosis Date   Anemia    Follows w/ PCP, Dr. Andrena Mews @ [facility].   Anxiety    DUB (dysfunctional uterine bleeding) 2023   Headache    chronic migraines, follows with Metta Clines, MD, neurology   HSV (herpes simplex virus) infection    Hx of chlamydia infection    Hx of gonorrhea     MEDS:   Current Outpatient Medications on File Prior to Visit  Medication Sig Dispense Refill   albuterol (VENTOLIN HFA) 108 (90 Base) MCG/ACT inhaler INHALE 1 TO 2 PUFFS INTO THE LUNGS EVERY 6 HOURS AS NEEDED FOR WHEEZING OR SHORTNESS OF BREATH Strength: 108 (90 Base) MCG/ACT (Patient taking differently: as needed. INHALE 1 TO 2 PUFFS INTO THE LUNGS EVERY 6 HOURS AS NEEDED FOR WHEEZING OR SHORTNESS OF BREATH Strength: 108 (90 Base) MCG/ACT) 8 g 1   amLODipine (NORVASC) 5 MG tablet Take 1 tablet (5 mg total) by mouth at bedtime. 90 tablet 0   Blood Pressure Monitoring (BLOOD PRESSURE CUFF) MISC 1 Units by Does not apply route daily. Upper arm cuff 1 each 0   calcium carbonate (TUMS - DOSED IN MG ELEMENTAL CALCIUM) 500 MG chewable tablet Chew 1-2 tablets by mouth 2 (two) times daily as needed.     Erenumab-aooe (AIMOVIG) 140 MG/ML SOAJ Inject 140 mg into the skin every 28 (twenty-eight) days. 1.12 mL 11   gabapentin (NEURONTIN) 100 MG capsule Take 1 capsules every 8 hours as  needed for pain 60 capsule 0   hydrOXYzine (ATARAX) 25 MG tablet Take 1 tablet (25 mg total) by mouth 3 (three) times daily as needed for anxiety. 30 tablet 3   ondansetron (ZOFRAN-ODT) 4 MG disintegrating tablet Take 1 tablet (4 mg total) by mouth every 8 (eight) hours as needed for nausea or vomiting. 20 tablet 0   SUMAtriptan (IMITREX) 100 MG tablet Take 1 tablet at earliest onset of migraine.  May repeat in 2 hours if headache persists or recurs.  Maximum 2 tablets in 24 hours 10 tablet 5   Ubrogepant (UBRELVY) 100 MG TABS Take 1 tablet (100 mg total) by mouth as needed (take 1 tab at the earlist onset of a Migraine, May repeat in 2 hour. Max 2 tabs in 24 hours). 16 tablet 5   ferrous sulfate 325 (65 FE) MG tablet Take 1 tablet (325 mg total) by mouth every other day. (Patient not taking: Reported on 11/17/2022) 30 tablet 0   ibuprofen (ADVIL) 800 MG tablet Take 1 tablet (800 mg total) by mouth every 8 (eight) hours as needed. (Patient not taking: Reported on 11/17/2022) 60 tablet 1   nystatin cream (MYCOSTATIN) Apply 1 Application topically 2 (two) times daily. (Patient not taking: Reported on 11/17/2022) 30 g 0   No current facility-administered medications on file prior  to visit.    ALLERGIES: Coconut flavor, Other, and Tomato  SH:  divorced, non smoker  Review of Systems  Constitutional: Negative.   Genitourinary:        Bleeding    PHYSICAL EXAMINATION:    BP 121/85   Pulse 87   Ht 5\' 2"  (1.575 m)   Wt 187 lb 6.4 oz (85 kg)   BMI 34.28 kg/m     General appearance: alert, cooperative and appears stated age Abdomen: soft, non-tender; bowel sounds normal; no masses,  no organomegaly Lymph:  no inguinal LAD noted  Pelvic: External genitalia:  no lesions              Urethra:  normal appearing urethra with no masses, tenderness or lesions              Bartholins and Skenes: normal                 Vagina: normal appearing vagina with normal color and discharge, no lesions, some  vaginal odor present              Cervix: no lesions, small amount of mucous with dark blood present              Bimanual Exam:  Uterus:  enlarged, about 8-10 weeks size              Adnexa: no mass, fullness, tenderness  Chaperone, Ezekiel Ina, RN, was present for exam.  Assessment/Plan: 1. Abnormal uterine bleeding - will check for anemia today - CBC - will increase megace to BID dosing.   - megestrol (MEGACE) 40 MG tablet; Take 1 tablet (40 mg total) by mouth 2 (two) times daily.  Dispense: 60 tablet; Refill: 3  2. Pelvic cramping - pt has gabapentin.  Dosages discussed.  She is going to adjust up to 300mg  if needed and let me know if/when needs RF and the dosage she is using  3. Post-operative state - has follow up scheduled again in 2 months  4. Vaginal odor - will treat with flagyl 500mg  bid x 7 days  Visit today was outside the normal post op appt with additional issues discussed and managed today.

## 2022-11-21 ENCOUNTER — Other Ambulatory Visit: Payer: Self-pay | Admitting: Family Medicine

## 2022-11-21 ENCOUNTER — Ambulatory Visit
Admission: RE | Admit: 2022-11-21 | Discharge: 2022-11-21 | Disposition: A | Discharge status: Home / Self Care | Payer: Medicaid Other | Source: Ambulatory Visit | Attending: Family Medicine | Admitting: Family Medicine

## 2022-11-21 ENCOUNTER — Encounter (HOSPITAL_BASED_OUTPATIENT_CLINIC_OR_DEPARTMENT_OTHER): Payer: Self-pay | Admitting: Obstetrics & Gynecology

## 2022-11-21 DIAGNOSIS — N6310 Unspecified lump in the right breast, unspecified quadrant: Secondary | ICD-10-CM

## 2022-11-21 DIAGNOSIS — R928 Other abnormal and inconclusive findings on diagnostic imaging of breast: Secondary | ICD-10-CM | POA: Diagnosis not present

## 2022-11-21 DIAGNOSIS — N6323 Unspecified lump in the left breast, lower outer quadrant: Secondary | ICD-10-CM | POA: Diagnosis not present

## 2022-11-21 DIAGNOSIS — N649 Disorder of breast, unspecified: Secondary | ICD-10-CM

## 2022-11-21 DIAGNOSIS — N6320 Unspecified lump in the left breast, unspecified quadrant: Secondary | ICD-10-CM

## 2022-11-22 ENCOUNTER — Encounter: Payer: Self-pay | Admitting: Family Medicine

## 2022-11-23 ENCOUNTER — Encounter (HOSPITAL_BASED_OUTPATIENT_CLINIC_OR_DEPARTMENT_OTHER): Payer: Self-pay | Admitting: Obstetrics & Gynecology

## 2022-11-23 ENCOUNTER — Other Ambulatory Visit (HOSPITAL_BASED_OUTPATIENT_CLINIC_OR_DEPARTMENT_OTHER): Payer: Self-pay | Admitting: Obstetrics & Gynecology

## 2022-11-23 DIAGNOSIS — G8918 Other acute postprocedural pain: Secondary | ICD-10-CM

## 2022-11-29 ENCOUNTER — Telehealth: Payer: Self-pay

## 2022-11-29 DIAGNOSIS — Z0184 Encounter for antibody response examination: Secondary | ICD-10-CM

## 2022-11-29 NOTE — Telephone Encounter (Signed)
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   According to her [facility] record we need titers for MMR, Hepatis B and Varicella.   She is also needing a TB test and a covid vaccine. (Will complete when she is here for titer draw.)  Please place titer orders and I will to schedule.

## 2022-12-01 ENCOUNTER — Telehealth: Payer: Self-pay

## 2022-12-01 NOTE — Addendum Note (Signed)
Addended by: Elberta Fortis on: 12/01/2022 05:15 PM   Modules accepted: Orders

## 2022-12-01 NOTE — Telephone Encounter (Signed)
Patient scheduled for tomorrow

## 2022-12-01 NOTE — Telephone Encounter (Signed)
Opened in error

## 2022-12-02 ENCOUNTER — Other Ambulatory Visit: Payer: Medicaid Other

## 2022-12-02 ENCOUNTER — Ambulatory Visit (INDEPENDENT_AMBULATORY_CARE_PROVIDER_SITE_OTHER): Payer: Medicaid Other

## 2022-12-02 DIAGNOSIS — Z0184 Encounter for antibody response examination: Secondary | ICD-10-CM

## 2022-12-02 DIAGNOSIS — Z111 Encounter for screening for respiratory tuberculosis: Secondary | ICD-10-CM | POA: Diagnosis not present

## 2022-12-02 DIAGNOSIS — Z23 Encounter for immunization: Secondary | ICD-10-CM | POA: Diagnosis not present

## 2022-12-02 NOTE — Progress Notes (Signed)
Patient presents to nurse clinic for COVID vaccine and TB screening. Discussed with patient TB skin test vs Quantiferon blood work. As patient is already receiving other labs, decided to proceed with Quantiferon TB test.   Received verbal order from Dr. Ardyth Harps.   Administered COVID vaccine. Patient tolerated injection well. Observed for 15 minutes post injection. No signs of adverse reaction noted.   Paperwork for school placed in provider box. This will need to be updated once immunity titers and Quantiferon results come back. Immunization record attached.

## 2022-12-03 LAB — MEASLES/MUMPS/RUBELLA IMMUNITY
RUBEOLA AB, IGG: >300 AU/mL (ref >16.4)
Rubella Antibodies, IGG: 7.14 index (ref >0.99)

## 2022-12-03 LAB — VARICELLA ZOSTER ABS, IGG/IGM: Varicella zoster IgG: 1840 index (ref >165)

## 2022-12-04 LAB — QUANTIFERON-TB GOLD PLUS

## 2022-12-05 ENCOUNTER — Ambulatory Visit: Payer: Medicaid Other

## 2022-12-06 LAB — QUANTIFERON-TB GOLD PLUS
QuantiFERON Nil Value: 0.05 IU/mL
QuantiFERON TB1 Ag Value: 0.05 IU/mL
QuantiFERON TB2 Ag Value: 0.05 IU/mL
QuantiFERON-TB Gold Plus: NEGATIVE

## 2022-12-07 LAB — VARICELLA ZOSTER ABS, IGG/IGM: Varicella IgM: <0.91 index (ref 0.00–0.90)

## 2022-12-07 LAB — HEPATITIS B SURFACE ANTIBODY, QUANTITATIVE: Hepatitis B Surf Ab Quant: 198 m[IU]/mL (ref >9.9)

## 2022-12-07 LAB — MEASLES/MUMPS/RUBELLA IMMUNITY: MUMPS ABS, IGG: 92 AU/mL (ref >10.9)

## 2022-12-13 ENCOUNTER — Ambulatory Visit (INDEPENDENT_AMBULATORY_CARE_PROVIDER_SITE_OTHER): Payer: Medicaid Other | Admitting: Obstetrics & Gynecology

## 2022-12-13 VITALS — BP 135/75 | HR 82 | Ht 62.0 in | Wt 179.0 lb

## 2022-12-13 DIAGNOSIS — D251 Intramural leiomyoma of uterus: Secondary | ICD-10-CM | POA: Diagnosis not present

## 2022-12-13 DIAGNOSIS — N921 Excessive and frequent menstruation with irregular cycle: Secondary | ICD-10-CM | POA: Diagnosis not present

## 2022-12-13 DIAGNOSIS — N946 Dysmenorrhea, unspecified: Secondary | ICD-10-CM

## 2022-12-13 MED ORDER — MYFEMBREE 40-1-0.5 MG PO TABS
1.0000 | ORAL_TABLET | Freq: Every day | ORAL | 4 refills | Status: DC
Start: 2022-12-13 — End: 2023-03-14

## 2022-12-13 MED ORDER — HYDROCODONE-ACETAMINOPHEN 5-325 MG PO TABS
1.0000 | ORAL_TABLET | Freq: Four times a day (QID) | ORAL | 0 refills | Status: DC | PRN
Start: 2022-12-13 — End: 2023-03-14

## 2022-12-13 NOTE — Progress Notes (Signed)
GYNECOLOGY  VISIT  CC:   follow up, irregular bleeding  HPI: 37 y.o. 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  Has done well from surgical stand point but she still having some irregular spotting.  Using gabapentin for cramping/pain.  Aygestin has really helped bleeding.  She is afraid to stop this right now.  Would like small prescription pain medication is she needs this.    Other treatment options for bleeding discussed.  She is interested in trying Myfembree for helping with bleeding due to fibroids.  Side effects discussed.  If has to have another surgery, she wants definitive treatment.   Past Medical History:  Diagnosis Date   Anemia    Follows w/ PCP, Dr. Janit Pagan @ [facility].   Anxiety    DUB (dysfunctional uterine bleeding) 2023   Headache    chronic migraines, follows with Shon Millet, MD, neurology   HSV (herpes simplex virus) infection    Hx of chlamydia infection    Hx of gonorrhea     MEDS:   Current Outpatient Medications on File Prior to Visit  Medication Sig Dispense Refill   albuterol (VENTOLIN HFA) 108 (90 Base) MCG/ACT inhaler INHALE 1 TO 2 PUFFS INTO THE LUNGS EVERY 6 HOURS AS NEEDED FOR WHEEZING OR SHORTNESS OF BREATH Strength: 108 (90 Base) MCG/ACT (Patient taking differently: as needed. INHALE 1 TO 2 PUFFS INTO THE LUNGS EVERY 6 HOURS AS NEEDED FOR WHEEZING OR SHORTNESS OF BREATH Strength: 108 (90 Base) MCG/ACT) 8 g 1   amLODipine (NORVASC) 5 MG tablet Take 1 tablet (5 mg total) by mouth at bedtime. 90 tablet 0   Blood Pressure Monitoring (BLOOD PRESSURE CUFF) MISC 1 Units by Does not apply route daily. Upper arm cuff 1 each 0   calcium carbonate (TUMS - DOSED IN MG ELEMENTAL CALCIUM) 500 MG chewable tablet Chew 1-2 tablets by mouth 2 (two) times daily as needed.     Erenumab-aooe (AIMOVIG) 140 MG/ML SOAJ Inject 140 mg into the skin every 28 (twenty-eight)  days. 1.12 mL 11   hydrOXYzine (ATARAX) 25 MG tablet Take 1 tablet (25 mg total) by mouth 3 (three) times daily as needed for anxiety. 30 tablet 3   megestrol (MEGACE) 40 MG tablet Take 1 tablet (40 mg total) by mouth 2 (two) times daily. 60 tablet 3   ondansetron (ZOFRAN-ODT) 4 MG disintegrating tablet Take 1 tablet (4 mg total) by mouth every 8 (eight) hours as needed for nausea or vomiting. 20 tablet 0   SUMAtriptan (IMITREX) 100 MG tablet Take 1 tablet at earliest onset of migraine.  May repeat in 2 hours if headache persists or recurs.  Maximum 2 tablets in 24 hours 10 tablet 5   Ubrogepant (UBRELVY) 100 MG TABS Take 1 tablet (100 mg total) by mouth as needed (take 1 tab at the earlist onset of a Migraine, May repeat in 2 hour. Max 2 tabs in 24 hours). 16 tablet 5   No current facility-administered medications on file prior to visit.    ALLERGIES: Coconut flavor, Other, and Tomato  SH:  single, non smoker  Review of Systems  Constitutional: Negative.   Genitourinary:        Irregular bleeding, cramping    PHYSICAL EXAMINATION:    Ht 5\' 2"  (1.575 m)   BMI 34.28 kg/m     General appearance: alert, cooperative and appears stated age CV:  Regular rate and  rhythm Lungs:  clear to auscultation, no wheezes, rales or rhonchi, symmetric air entry  Assessment/Plan: 1. Menorrhagia with irregular cycle - will start Myfembree dail.   - recheck 2 months - Relugolix-Estradiol-Norethind (MYFEMBREE) 40-1-0.5 MG TABS; Take 1 tablet by mouth daily.  Dispense: 28 tablet; Refill: 4  2. Intramural leiomyoma of uterus  3. Dysmenorrhea - HYDROcodone-acetaminophen (NORCO/VICODIN) 5-325 MG tablet; Take 1 tablet by mouth every 6 (six) hours as needed for moderate pain.  Dispense: 15 tablet; Refill: 0

## 2022-12-14 ENCOUNTER — Other Ambulatory Visit (HOSPITAL_BASED_OUTPATIENT_CLINIC_OR_DEPARTMENT_OTHER): Payer: Self-pay | Admitting: Obstetrics & Gynecology

## 2022-12-14 DIAGNOSIS — G8918 Other acute postprocedural pain: Secondary | ICD-10-CM

## 2022-12-19 ENCOUNTER — Ambulatory Visit (INDEPENDENT_AMBULATORY_CARE_PROVIDER_SITE_OTHER): Payer: Medicaid Other | Admitting: Student

## 2022-12-19 VITALS — BP 118/76 | HR 89 | Temp 98.4°F | Ht 62.0 in | Wt 189.6 lb

## 2022-12-19 DIAGNOSIS — R110 Nausea: Secondary | ICD-10-CM | POA: Diagnosis not present

## 2022-12-19 NOTE — Progress Notes (Signed)
  SUBJECTIVE:   CHIEF COMPLAINT / HPI:   This morning experienced emesis (nonbloody), fever Tmax 103. She took tylenol for the fever, fatigue. She has not had diarrhea, nausea has improved since she took zofran at home. She has been able to eat/drink.  She went to a wedding on Saturday and notes that several folks have come down with these symptoms.   PERTINENT  PMH / PSH: Tubal ligation, HTN, AUB, migraines  Patient Care Team: Elberta Fortis, MD as PCP - General (Family Medicine) OBJECTIVE:  BP 118/76   Pulse 89   Temp 98.4 F (36.9 C) (Oral)   Ht 5\' 2"  (1.575 m)   Wt 189 lb 9.6 oz (86 kg)   SpO2 98%   BMI 34.68 kg/m  General: Well-appearing, NAD CV: RRR, no murmurs auscultated Pulm: Normal WOB Abdomen: Soft, nontender, normoactive bowel sounds Extremities: Cap refill <2 seconds  ASSESSMENT/PLAN:  Nausea Assessment & Plan: Self-limiting without further symptomatology other than subjective fever. Controlled with Tylenol and Zofran.  Suspect related to food poisoning or viral gastroenteritis exposed at a wedding 2 days ago.  However, she is of reproductive age although has had tubal ligation.  She is unable to track her periods given abnormal uterine bleeding.  She is sexually active.  Declines pregnancy test today, I have counseled her on should symptoms continue for the next 48 hours just as this then I would recommend pregnancy testing.  Otherwise, the symptoms should resolve within a 48-hour window.  Lack of abdominal pain and correlation with food would not expect this to be appendicitis versus gallbladder etiology versus obstruction.   Return if symptoms worsen or fail to improve. Shelby Mattocks, DO 12/19/2022, 2:53 PM PGY-2, [facility]

## 2022-12-19 NOTE — Patient Instructions (Addendum)
It was great to see you today! Thank you for choosing [facility] for your primary care. [name] was seen for same-day appointment.  Today we addressed: I suspect this is a GI bug.  May continue taking Zofran for nausea and Tylenol if you have a fever.  I would expect this to resolve in 48 hours.  I understand you have had a tubal ligation but if this does continue we should consider pregnancy testing you.  If you haven't already, sign up for My Chart to have easy access to your labs results, and communication with your primary care physician.  Call the clinic at [phone] if your symptoms worsen or you have any concerns.  You should return to our clinic Return if symptoms worsen or fail to improve. Please arrive 15 minutes before your appointment to ensure smooth check in process.  We appreciate your efforts in making this happen.  Thank you for allowing me to participate in your care, Shelby Mattocks, DO 12/19/2022, 2:18 PM PGY-2, [facility]

## 2022-12-19 NOTE — Assessment & Plan Note (Signed)
Self-limiting without further symptomatology other than subjective fever. Controlled with Tylenol and Zofran.  Suspect related to food poisoning or viral gastroenteritis exposed at a wedding 2 days ago.  However, she is of reproductive age although has had tubal ligation.  She is unable to track her periods given abnormal uterine bleeding.  She is sexually active.  Declines pregnancy test today, I have counseled her on should symptoms continue for the next 48 hours just as this then I would recommend pregnancy testing.  Otherwise, the symptoms should resolve within a 48-hour window.  Lack of abdominal pain and correlation with food would not expect this to be appendicitis versus gallbladder etiology versus obstruction.

## 2022-12-19 NOTE — Telephone Encounter (Signed)
Reviewed verbally with Dr Hyacinth Meeker.  Pt was seen last week and she approved the refill. KW CMA

## 2022-12-20 ENCOUNTER — Encounter (HOSPITAL_BASED_OUTPATIENT_CLINIC_OR_DEPARTMENT_OTHER): Payer: Self-pay | Admitting: Obstetrics & Gynecology

## 2022-12-22 ENCOUNTER — Other Ambulatory Visit: Payer: Medicaid Other

## 2023-01-06 ENCOUNTER — Other Ambulatory Visit: Payer: Self-pay | Admitting: Neurology

## 2023-01-06 ENCOUNTER — Other Ambulatory Visit: Payer: Self-pay | Admitting: Family Medicine

## 2023-01-06 DIAGNOSIS — R519 Headache, unspecified: Secondary | ICD-10-CM

## 2023-01-06 DIAGNOSIS — I10 Essential (primary) hypertension: Secondary | ICD-10-CM

## 2023-01-23 ENCOUNTER — Other Ambulatory Visit: Payer: Self-pay | Admitting: Family Medicine

## 2023-01-23 DIAGNOSIS — F410 Panic disorder [episodic paroxysmal anxiety] without agoraphobia: Secondary | ICD-10-CM

## 2023-03-14 ENCOUNTER — Other Ambulatory Visit (HOSPITAL_BASED_OUTPATIENT_CLINIC_OR_DEPARTMENT_OTHER): Payer: Self-pay | Admitting: Obstetrics & Gynecology

## 2023-03-14 ENCOUNTER — Encounter (HOSPITAL_BASED_OUTPATIENT_CLINIC_OR_DEPARTMENT_OTHER): Payer: Self-pay | Admitting: Obstetrics & Gynecology

## 2023-03-14 ENCOUNTER — Ambulatory Visit (HOSPITAL_BASED_OUTPATIENT_CLINIC_OR_DEPARTMENT_OTHER): Payer: Medicaid Other | Admitting: Obstetrics & Gynecology

## 2023-03-14 DIAGNOSIS — N921 Excessive and frequent menstruation with irregular cycle: Secondary | ICD-10-CM

## 2023-03-14 DIAGNOSIS — N946 Dysmenorrhea, unspecified: Secondary | ICD-10-CM | POA: Diagnosis not present

## 2023-03-14 DIAGNOSIS — D251 Intramural leiomyoma of uterus: Secondary | ICD-10-CM | POA: Diagnosis not present

## 2023-03-14 DIAGNOSIS — N939 Abnormal uterine and vaginal bleeding, unspecified: Secondary | ICD-10-CM

## 2023-03-14 DIAGNOSIS — G8918 Other acute postprocedural pain: Secondary | ICD-10-CM

## 2023-03-14 MED ORDER — GABAPENTIN 100 MG PO CAPS
ORAL_CAPSULE | ORAL | 1 refills | Status: DC
Start: 2023-03-14 — End: 2023-06-15

## 2023-03-14 MED ORDER — HYDROCODONE-ACETAMINOPHEN 5-325 MG PO TABS
1.0000 | ORAL_TABLET | Freq: Four times a day (QID) | ORAL | 0 refills | Status: DC | PRN
Start: 2023-03-14 — End: 2023-06-15

## 2023-03-14 MED ORDER — MYFEMBREE 40-1-0.5 MG PO TABS: 1.0000 | ORAL_TABLET | Freq: Every day | ORAL | 4 refills | Status: DC

## 2023-03-14 MED ORDER — MEGESTROL ACETATE 40 MG PO TABS: 40.0000 mg | ORAL_TABLET | Freq: Two times a day (BID) | ORAL | 3 refills | Status: DC

## 2023-03-14 NOTE — Progress Notes (Signed)
GYNECOLOGY  VISIT  CC:   vaginal bleeding follow  HPI: 37 y.o. [id number] Divorced Black or [place] American female here for recheck of vaginal bleeding/pelvic cramping after undergoing [facility] RFA fibroid ablation.  Pt's bleeding isn't heavy but is almost every day and he continues to have cramping.  She really is tired of this and ready to proceed with definitive treatment.  She is ready to proceed with hysterectomy.  She does need refills on medication as well.  Timing for surgery discussed with pt.   Past Medical History:  Diagnosis Date   Anemia    Follows w/ PCP, Dr. Janit Pagan @ [facility].   Anxiety    DUB (dysfunctional uterine bleeding) 2023   Headache    chronic migraines, follows with Shon Millet, MD, neurology   HSV (herpes simplex virus) infection    Hx of chlamydia infection    Hx of gonorrhea     MEDS:   Current Outpatient Medications on File Prior to Visit  Medication Sig Dispense Refill   albuterol (VENTOLIN HFA) 108 (90 Base) MCG/ACT inhaler INHALE 1 TO 2 PUFFS INTO THE LUNGS EVERY 6 HOURS AS NEEDED FOR WHEEZING OR SHORTNESS OF BREATH Strength: 108 (90 Base) MCG/ACT (Patient taking differently: as needed. INHALE 1 TO 2 PUFFS INTO THE LUNGS EVERY 6 HOURS AS NEEDED FOR WHEEZING OR SHORTNESS OF BREATH Strength: 108 (90 Base) MCG/ACT) 8 g 1   amLODipine (NORVASC) 5 MG tablet TAKE 1 TABLET BY MOUTH NIGHTLY AT BEDTIME 90 tablet 1   Blood Pressure Monitoring (BLOOD PRESSURE CUFF) MISC 1 Units by Does not apply route daily. Upper arm cuff 1 each 0   calcium carbonate (TUMS - DOSED IN MG ELEMENTAL CALCIUM) 500 MG chewable tablet Chew 1-2 tablets by mouth 2 (two) times daily as needed.     Erenumab-aooe (AIMOVIG) 140 MG/ML SOAJ Inject 140 mg into the skin every 28 (twenty-eight) days. 1.12 mL 11   gabapentin (NEURONTIN) 100 MG capsule TAKE 1 CAPSULE BY MOUTH EVERY 8 HOURS AS NEEDED FOR PAIN 60 capsule 0   HYDROcodone-acetaminophen (NORCO/VICODIN) 5-325 MG tablet  Take 1 tablet by mouth every 6 (six) hours as needed for moderate pain. 15 tablet 0   hydrOXYzine (ATARAX) 25 MG tablet TAKE 1 TABLET BY MOUTH 3 TIMES DAILY AS NEEDED FOR ANXIETY 30 tablet 3   megestrol (MEGACE) 40 MG tablet Take 1 tablet (40 mg total) by mouth 2 (two) times daily. 60 tablet 3   ondansetron (ZOFRAN-ODT) 4 MG disintegrating tablet DISSOLVE ONE TABLET in MOUTH EVERY 8 HOURS AS NEEDED FOR NAUSEA OR FOR VOMITING 20 tablet 3   Relugolix-Estradiol-Norethind (MYFEMBREE) 40-1-0.5 MG TABS Take 1 tablet by mouth daily. 28 tablet 4   SUMAtriptan (IMITREX) 100 MG tablet Take 1 tablet at earliest onset of migraine.  May repeat in 2 hours if headache persists or recurs.  Maximum 2 tablets in 24 hours 10 tablet 5   Ubrogepant (UBRELVY) 100 MG TABS Take 1 tablet (100 mg total) by mouth as needed (take 1 tab at the earlist onset of a Migraine, May repeat in 2 hour. Max 2 tabs in 24 hours). 16 tablet 5   No current facility-administered medications on file prior to visit.    ALLERGIES: Coconut flavor, Other, and Tomato  SH:  divorced, non smoker  Review of Systems  Constitutional: Negative.   Genitourinary: Negative.     PHYSICAL EXAMINATION:    BP (!) 125/94 (BP Location: Left Arm, Patient Position: Sitting, Cuff  Size: Large)   Pulse 72   Ht 5\' 2"  (1.575 m) Comment: Reported  Wt 184 lb 6.4 oz (83.6 kg)   BMI 33.73 kg/m     Physical Exam Constitutional:      Appearance: Normal appearance.  Skin:    General: Skin is warm.  Neurological:     General: No focal deficit present.     Mental Status: She is alert and oriented to person, place, and time.     Assessment/Plan: 1. Abnormal uterine bleeding - continue megace.  Pt does sometimes just take daily dosing. - megestrol (MEGACE) 40 MG tablet; Take 1 tablet (40 mg total) by mouth 2 (two) times daily.  Dispense: 60 tablet; Refill: 3  2. Menorrhagia with irregular cycle - Relugolix-Estradiol-Norethind (MYFEMBREE) 40-1-0.5 MG  TABS; Take 1 tablet by mouth daily.  Dispense: 28 tablet; Refill: 4  3. Intramural leiomyoma of uterus - will proceed with surgical planning.  Message sent to surgery scheduler  4. Dysmenorrhea - gabapentin (NEURONTIN) 100 MG capsule; TAKE 1 CAPSULE BY MOUTH EVERY 8 HOURS AS NEEDED FOR PAIN  Dispense: 60 capsule; Refill: 1 - HYDROcodone-acetaminophen (NORCO/VICODIN) 5-325 MG tablet; Take 1 tablet by mouth every 6 (six) hours as needed for moderate pain.  Dispense: 15 tablet; Refill: 0

## 2023-03-27 ENCOUNTER — Other Ambulatory Visit: Payer: Self-pay

## 2023-03-27 DIAGNOSIS — I10 Essential (primary) hypertension: Secondary | ICD-10-CM

## 2023-03-27 MED ORDER — AMLODIPINE BESYLATE 5 MG PO TABS
5.0000 mg | ORAL_TABLET | Freq: Every day | ORAL | 1 refills | Status: DC
Start: 2023-03-27 — End: 2024-05-21

## 2023-04-04 ENCOUNTER — Other Ambulatory Visit: Payer: Self-pay | Admitting: Neurology

## 2023-04-11 ENCOUNTER — Other Ambulatory Visit: Payer: Self-pay | Admitting: Family Medicine

## 2023-04-11 DIAGNOSIS — I10 Essential (primary) hypertension: Secondary | ICD-10-CM
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
## 2023-05-03 ENCOUNTER — Telehealth: Payer: Self-pay

## 2023-05-03 ENCOUNTER — Ambulatory Visit: Payer: Medicaid Other | Admitting: Neurology

## 2023-05-03 NOTE — Telephone Encounter (Signed)
Called patient to schedule surgery w/ Dr. Hyacinth Meeker. Patient agreed to have procedure on 06/14/23 @[facility]  at 11:30 am. Patient is aware she must arrive by 9:30 am. Provided pre-op instructions over the phone. Patient confirmed understanding.

## 2023-05-04 ENCOUNTER — Telehealth (HOSPITAL_BASED_OUTPATIENT_CLINIC_OR_DEPARTMENT_OTHER): Payer: Self-pay | Admitting: *Deleted

## 2023-05-04 ENCOUNTER — Encounter (HOSPITAL_BASED_OUTPATIENT_CLINIC_OR_DEPARTMENT_OTHER): Payer: Self-pay

## 2023-05-04 NOTE — Telephone Encounter (Signed)
Called pt to advise that she would need to come by the office by end of next week to sign sterilization form needed prior to surgery. Pt verbalized understanding.

## 2023-05-04 NOTE — Telephone Encounter (Signed)
-----   Message from Donne Hazel sent at 05/03/2023  4:46 PM EDT ----- Regarding: Sterilization Consent Form Cala Bradford,   I have your patient scheduled for 06/14/23 but I'm not showing a medicaid consent form in the chart. Does your office have the form?  Thanks, Dejuana ----- Message ----- From: Jerene Bears, MD Sent: 03/14/2023   3:41 PM EDT To: Donne Hazel Subject: surgery                                        Dejuana, This pt needs to be scheduled for TLH/bilateral salpingectomy, possible oophorectomy, cystoscopy.  Dx:  menorrhagia, iron def anemia and fibroid uterus.  Assistant:  laparoscopic assistant please.  Special requests:  she desires this in October.  She knows the schedule isn't open yet.    Thank you.  Lum Keas, MD

## 2023-05-11 ENCOUNTER — Other Ambulatory Visit (HOSPITAL_BASED_OUTPATIENT_CLINIC_OR_DEPARTMENT_OTHER): Payer: Self-pay | Admitting: Obstetrics & Gynecology

## 2023-05-11 DIAGNOSIS — N939 Abnormal uterine and vaginal bleeding, unspecified: Secondary | ICD-10-CM

## 2023-05-12 ENCOUNTER — Telehealth: Payer: Self-pay

## 2023-05-12 NOTE — Telephone Encounter (Signed)
Medicaid Managed Care   Unsuccessful Outreach Note  05/12/2023 Name: [name] MRN: [id number] DOB: 1986/08/06  Referred by: Elberta Fortis, MD Reason for referral : No chief complaint on file.   An unsuccessful telephone outreach was attempted today. The patient was referred to the case management team for assistance with care management and care coordination.   Follow Up Plan: If patient returns call to provider office, please advise to call Embedded Care Management Care Guide Nicholes Rough* at [phone]*  Nicholes Rough, CMA Care Guide [employer]

## 2023-05-23 ENCOUNTER — Encounter (HOSPITAL_BASED_OUTPATIENT_CLINIC_OR_DEPARTMENT_OTHER): Payer: Self-pay | Admitting: Obstetrics & Gynecology

## 2023-05-24 ENCOUNTER — Ambulatory Visit (HOSPITAL_BASED_OUTPATIENT_CLINIC_OR_DEPARTMENT_OTHER): Payer: Medicaid Other

## 2023-05-24 ENCOUNTER — Ambulatory Visit (HOSPITAL_BASED_OUTPATIENT_CLINIC_OR_DEPARTMENT_OTHER): Payer: Medicaid Other | Admitting: Obstetrics & Gynecology

## 2023-05-24 ENCOUNTER — Other Ambulatory Visit (HOSPITAL_BASED_OUTPATIENT_CLINIC_OR_DEPARTMENT_OTHER): Payer: Self-pay | Admitting: Obstetrics & Gynecology

## 2023-05-24 DIAGNOSIS — D250 Submucous leiomyoma of uterus: Secondary | ICD-10-CM

## 2023-05-24 DIAGNOSIS — N921 Excessive and frequent menstruation with irregular cycle: Secondary | ICD-10-CM | POA: Diagnosis not present

## 2023-05-24 DIAGNOSIS — D251 Intramural leiomyoma of uterus: Secondary | ICD-10-CM

## 2023-05-24 DIAGNOSIS — Z862 Personal history of diseases of the blood and blood-forming organs and certain disorders involving the immune mechanism: Secondary | ICD-10-CM

## 2023-05-31 ENCOUNTER — Other Ambulatory Visit: Payer: Self-pay

## 2023-05-31 ENCOUNTER — Encounter (HOSPITAL_BASED_OUTPATIENT_CLINIC_OR_DEPARTMENT_OTHER): Payer: Self-pay | Admitting: Obstetrics & Gynecology

## 2023-05-31 DIAGNOSIS — Z01818 Encounter for other preprocedural examination: Secondary | ICD-10-CM | POA: Diagnosis not present

## 2023-05-31 NOTE — Progress Notes (Signed)
Spoke w/ via phone for pre-op interview---[facility] needs dos---- UPT per anesthesia, surgeon orders pending        Lab results------06/08/23 lab appt for cbc, bmp, type & screen, ekg COVID test -----patient states asymptomatic no test needed Arrive at -------1045 on Wednesday, 06/14/2023 NPO after MN NO Solid Food.  Clear liquids from MN until---0945 Med rec completed Medications to take morning of surgery -----Amlodipine, Gabapentin, Atarax prn, Imitrex prn, Zofran prn, Ubrevly prn Diabetic medication -----n/a Patient instructed no nail polish to be worn day of surgery Patient instructed to bring photo id and insurance card day of surgery Patient aware to have Driver (ride ) / caregiver    for 24 hours after surgery - mother, Armstead Peaks Patient Special Instructions -----Extended / overnight stay instructions given.  Pre-Op special Instructions -----Requested orders from Dr. Hyacinth Meeker via Epic IB on 05/31/2023. Patient verbalized understanding of instructions that were given at this phone interview. Patient denies chest pain, sob, fever, cough at the interview.

## 2023-05-31 NOTE — Progress Notes (Signed)
Your procedure is scheduled on Wednesday, 06/14/23.  Report to [facility] AT  10:45  AM.   Call this number if you have problems the morning of surgery  :[phone].   OUR ADDRESS IS [address].  [facility] ARE LOCATED IN [facility].  PLEASE BRING YOUR INSURANCE CARD AND PHOTO ID DAY OF SURGERY.  ONLY 2 PEOPLE ARE ALLOWED IN  WAITING  ROOM                                      REMEMBER:  DO NOT EAT FOOD, CANDY GUM OR MINTS  AFTER MIDNIGHT THE NIGHT BEFORE YOUR SURGERY . YOU MAY HAVE CLEAR LIQUIDS FROM MIDNIGHT THE NIGHT BEFORE YOUR SURGERY UNTIL  9:45 AM. NO CLEAR LIQUIDS AFTER   9:45 AM DAY OF SURGERY.  YOU MAY  BRUSH YOUR TEETH MORNING OF SURGERY AND RINSE YOUR MOUTH OUT, NO CHEWING GUM CANDY OR MINTS.     CLEAR LIQUID DIET    Allowed      Water                                                                   Coffee and tea, regular and decaf  (NO cream or milk products of any type, may sweeten)                         Carbonated beverages, regular and diet                                    Sports drinks like Gatorade _____________________________________________________________________     TAKE ONLY THESE MEDICATIONS MORNING OF SURGERY: Amlodipine If needed you may also take Gabapentin, Atarax, Imitrex, Zofran, and Ubrevly.                                        DO NOT WEAR JEWERLY/  METAL/  PIERCINGS (INCLUDING NO PLASTIC PIERCINGS) DO NOT WEAR LOTIONS, POWDERS, PERFUMES OR NAIL POLISH ON YOUR FINGERNAILS. TOENAIL POLISH IS OK TO WEAR. DO NOT SHAVE FOR 48 HOURS PRIOR TO DAY OF SURGERY.  CONTACTS, GLASSES, OR DENTURES MAY NOT BE WORN TO SURGERY.  REMEMBER: NO SMOKING, VAPING ,  DRUGS OR ALCOHOL FOR 24 HOURS BEFORE YOUR SURGERY.                                     IS NOT RESPONSIBLE  FOR ANY BELONGINGS.                                                                    Mar[facility]  [place] - Preparing for  Surgery Before surgery, you can play an important role.  Because skin is not sterile, your skin needs to be as free of germs as possible.  You can reduce the number of germs on your skin by washing with CHG (chlorahexidine gluconate) soap before surgery.  CHG is an antiseptic cleaner which kills germs and bonds with the skin to continue killing germs even after washing. Please DO NOT use if you have an allergy to CHG or antibacterial soaps.  If your skin becomes reddened/irritated stop using the CHG and inform your nurse when you arrive at Short Stay. Do not shave (including legs and underarms) for at least 48 hours prior to the first CHG shower.  You may shave your face/neck. Please follow these instructions carefully:  1.  Shower with CHG Soap the night before surgery and the  morning of Surgery.  2.  If you choose to wash your hair, wash your hair first as usual with your  normal  shampoo.  3.  After you shampoo, rinse your hair and body thoroughly to remove the  shampoo.                                        4.  Use CHG as you would any other liquid soap.  You can apply chg directly  to the skin and wash , chg soap provided, night before and morning of your surgery.  5.  Apply the CHG Soap to your body ONLY FROM THE NECK DOWN.   Do not use on face/ open                           Wound or open sores. Avoid contact with eyes, ears mouth and genitals (private parts).                       Wash face,  Genitals (private parts) with your normal soap.             6.  Wash thoroughly, paying special attention to the area where your surgery  will be performed.  7.  Thoroughly rinse your body with warm water from the neck down.  8.  DO NOT shower/wash with your normal soap after using and rinsing off  the CHG Soap.             9.  Pat yourself dry with a clean towel.            10.  Wear clean pajamas.            11.  Place clean sheets on your bed the night of your first shower and do not  sleep with  pets. Day of Surgery : Do not apply any lotions/ powders the morning of surgery.  Please wear clean clothes to the hospital/surgery center.  IF YOU HAVE ANY SKIN IRRITATION OR PROBLEMS WITH THE SURGICAL SOAP, PLEASE GET A BAR OF GOLD DIAL SOAP AND SHOWER THE NIGHT BEFORE YOUR SURGERY AND THE MORNING OF YOUR SURGERY. PLEASE LET THE NURSE KNOW MORNING OF YOUR SURGERY IF YOU HAD ANY PROBLEMS WITH THE SURGICAL SOAP.   YOUR SURGEON MAY HAVE REQUESTED EXTENDED RECOVERY TIME AFTER YOUR SURGERY. IT COULD BE A  JUST A FEW HOURS  UP TO AN OVERNIGHT STAY.  YOUR SURGEON SHOULD HAVE  DISCUSSED THIS WITH YOU PRIOR TO YOUR SURGERY. IN THE EVENT YOU NEED TO STAY OVERNIGHT PLEASE REFER TO THE FOLLOWING GUIDELINES. YOU MAY HAVE UP TO 4 VISITORS  MAY VISIT IN THE EXTENDED RECOVERY ROOM UNTIL 800 PM ONLY.  ONE  VISITOR AGE 75 AND OVER MAY SPEND THE NIGHT AND MUST BE IN EXTENDED RECOVERY ROOM NO LATER THAN 800 PM . YOUR DISCHARGE TIME AFTER YOU SPEND THE NIGHT IS 900 AM THE MORNING AFTER YOUR SURGERY. YOU MAY PACK A SMALL OVERNIGHT BAG WITH TOILETRIES FOR YOUR OVERNIGHT STAY IF YOU WISH.  REGARDLESS OF IF YOU STAY OVER NIGHT OR ARE DISCHARGED THE SAME DAY YOU WILL BE REQUIRED TO HAVE A RESPONSIBLE ADULT (18 YRS OLD OR OLDER) STAY WITH YOU FOR AT LEAST THE FIRST 24 HOURS  YOUR PRESCRIPTION MEDICATIONS WILL BE PROVIDED DURING YOUR HOSPITAL STAY.  ________________________________________________________________________                                                        QUESTIONS Mechele Claude PRE OP NURSE PHONE [phone].

## 2023-06-02 ENCOUNTER — Other Ambulatory Visit (HOSPITAL_BASED_OUTPATIENT_CLINIC_OR_DEPARTMENT_OTHER): Payer: Self-pay | Admitting: Obstetrics & Gynecology

## 2023-06-02 DIAGNOSIS — Z01812 Encounter for preprocedural laboratory examination: Secondary | ICD-10-CM

## 2023-06-08 ENCOUNTER — Encounter (HOSPITAL_COMMUNITY)
Admission: RE | Admit: 2023-06-08 | Discharge: 2023-06-08 | Disposition: A | Discharge status: Home / Self Care | Payer: Medicaid Other | Source: Ambulatory Visit | Attending: Obstetrics & Gynecology | Admitting: Obstetrics & Gynecology

## 2023-06-08 DIAGNOSIS — Z01818 Encounter for other preprocedural examination: Secondary | ICD-10-CM | POA: Diagnosis not present

## 2023-06-08 DIAGNOSIS — Z01812 Encounter for preprocedural laboratory examination: Secondary | ICD-10-CM

## 2023-06-08 LAB — BASIC METABOLIC PANEL
Anion gap: 7 (ref 5–15)
BUN: 9 mg/dL (ref 6–20)
CO2: 24 mmol/L (ref 22–32)
Calcium: 9.2 mg/dL (ref 8.9–10.3)
Chloride: 103 mmol/L (ref 98–111)
Creatinine, Ser: 0.65 mg/dL (ref 0.44–1.00)
GFR, Estimated: >60 mL/min (ref >60)
Glucose, Bld: 86 mg/dL (ref 70–99)
Potassium: 3.7 mmol/L (ref 3.5–5.1)
Sodium: 134 mmol/L — ABNORMAL LOW (ref 135–145)

## 2023-06-08 LAB — CBC
HCT: 37.9 % (ref 36.0–46.0)
Hemoglobin: 12.9 g/dL (ref 12.0–15.0)
MCH: 29.5 pg (ref 26.0–34.0)
MCHC: 34 g/dL (ref 30.0–36.0)
MCV: 86.5 fL (ref 80.0–100.0)
Platelets: 318 10*3/uL (ref 150–400)
RBC: 4.38 MIL/uL (ref 3.87–5.11)
RDW: 12.1 % (ref 11.5–15.5)
WBC: 5 10*3/uL (ref 4.0–10.5)
nRBC: 0 % (ref 0.0–0.2)

## 2023-06-09 NOTE — Plan of Care (Signed)
[facility] Tonsillectomy/Adenoidectomy, Postoperative PEDS care plan entered in error.

## 2023-06-14 ENCOUNTER — Other Ambulatory Visit: Payer: Self-pay

## 2023-06-14 ENCOUNTER — Ambulatory Visit (HOSPITAL_BASED_OUTPATIENT_CLINIC_OR_DEPARTMENT_OTHER): Payer: Self-pay | Admitting: Anesthesiology

## 2023-06-14 ENCOUNTER — Encounter (HOSPITAL_BASED_OUTPATIENT_CLINIC_OR_DEPARTMENT_OTHER): Payer: Self-pay | Admitting: Obstetrics & Gynecology

## 2023-06-14 ENCOUNTER — Ambulatory Visit (HOSPITAL_BASED_OUTPATIENT_CLINIC_OR_DEPARTMENT_OTHER)
Admission: RE | Admit: 2023-06-14 | Discharge: 2023-06-15 | Disposition: A | Discharge status: Home / Self Care | Payer: Medicaid Other | Source: Ambulatory Visit | Attending: Obstetrics & Gynecology | Admitting: Obstetrics & Gynecology

## 2023-06-14 ENCOUNTER — Encounter (HOSPITAL_BASED_OUTPATIENT_CLINIC_OR_DEPARTMENT_OTHER)
Admission: RE | Disposition: A | Discharge status: Home / Self Care | Payer: Self-pay | Source: Ambulatory Visit | Attending: Obstetrics & Gynecology

## 2023-06-14 ENCOUNTER — Ambulatory Visit (HOSPITAL_BASED_OUTPATIENT_CLINIC_OR_DEPARTMENT_OTHER): Payer: Medicaid Other | Admitting: Anesthesiology

## 2023-06-14 DIAGNOSIS — N920 Excessive and frequent menstruation with regular cycle: Secondary | ICD-10-CM | POA: Insufficient documentation

## 2023-06-14 DIAGNOSIS — Z01812 Encounter for preprocedural laboratory examination: Secondary | ICD-10-CM

## 2023-06-14 DIAGNOSIS — N72 Inflammatory disease of cervix uteri: Secondary | ICD-10-CM | POA: Insufficient documentation

## 2023-06-14 DIAGNOSIS — D251 Intramural leiomyoma of uterus: Secondary | ICD-10-CM

## 2023-06-14 DIAGNOSIS — N8003 Adenomyosis of the uterus: Secondary | ICD-10-CM | POA: Insufficient documentation

## 2023-06-14 DIAGNOSIS — D500 Iron deficiency anemia secondary to blood loss (chronic): Secondary | ICD-10-CM | POA: Diagnosis not present

## 2023-06-14 DIAGNOSIS — D259 Leiomyoma of uterus, unspecified: Secondary | ICD-10-CM | POA: Diagnosis present

## 2023-06-14 DIAGNOSIS — R519 Headache, unspecified: Secondary | ICD-10-CM

## 2023-06-14 DIAGNOSIS — I10 Essential (primary) hypertension: Secondary | ICD-10-CM | POA: Diagnosis not present

## 2023-06-14 DIAGNOSIS — D289 Benign neoplasm of female genital organ, unspecified: Secondary | ICD-10-CM

## 2023-06-14 DIAGNOSIS — Z01818 Encounter for other preprocedural examination: Secondary | ICD-10-CM

## 2023-06-14 DIAGNOSIS — N921 Excessive and frequent menstruation with irregular cycle: Secondary | ICD-10-CM | POA: Diagnosis not present

## 2023-06-14 HISTORY — PX: TOTAL LAPAROSCOPIC HYSTERECTOMY WITH BILATERAL SALPINGO OOPHORECTOMY: SHX6845

## 2023-06-14 HISTORY — PX: CYSTOSCOPY: SHX5120

## 2023-06-14 HISTORY — DX: Unspecified asthma, uncomplicated: J45.909

## 2023-06-14 LAB — TYPE AND SCREEN
ABO/RH(D): A POS
Antibody Screen: NEGATIVE

## 2023-06-14 LAB — POCT PREGNANCY, URINE: Preg Test, Ur: NEGATIVE

## 2023-06-14 SURGERY — HYSTERECTOMY, TOTAL, LAPAROSCOPIC, WITH BILATERAL SALPINGO-OOPHORECTOMY
Anesthesia: General

## 2023-06-14 MED ORDER — FENTANYL CITRATE (PF) 100 MCG/2ML IJ SOLN
INTRAMUSCULAR | Status: AC
  Filled 2023-06-14: qty 2

## 2023-06-14 MED ORDER — PROPOFOL 10 MG/ML IV BOLUS
INTRAVENOUS | Status: AC
  Filled 2023-06-14: qty 20

## 2023-06-14 MED ORDER — DEXTROSE-SODIUM CHLORIDE 5-0.45 % IV SOLN: INTRAVENOUS | Status: DC

## 2023-06-14 MED ORDER — SCOPOLAMINE 1 MG/3DAYS TD PT72
1.0000 | MEDICATED_PATCH | TRANSDERMAL | Status: DC
  Administered 2023-06-14: 1.5 mg via TRANSDERMAL

## 2023-06-14 MED ORDER — BUPIVACAINE HCL (PF) 0.25 % IJ SOLN
INTRAMUSCULAR | Status: DC | PRN
  Administered 2023-06-14: 18 mL

## 2023-06-14 MED ORDER — OXYCODONE-ACETAMINOPHEN 5-325 MG PO TABS
1.0000 | ORAL_TABLET | ORAL | Status: DC | PRN
  Administered 2023-06-14 – 2023-06-15 (×5): 1 via ORAL

## 2023-06-14 MED ORDER — OXYCODONE HCL 5 MG PO TABS: 5.0000 mg | ORAL_TABLET | Freq: Once | ORAL | Status: DC | PRN

## 2023-06-14 MED ORDER — SIMETHICONE 80 MG PO CHEW
80.0000 mg | CHEWABLE_TABLET | Freq: Four times a day (QID) | ORAL | Status: DC | PRN
  Administered 2023-06-14: 80 mg via ORAL

## 2023-06-14 MED ORDER — DEXAMETHASONE SODIUM PHOSPHATE 10 MG/ML IJ SOLN
INTRAMUSCULAR | Status: AC
  Filled 2023-06-14: qty 1

## 2023-06-14 MED ORDER — HYDROMORPHONE HCL 1 MG/ML IJ SOLN
INTRAMUSCULAR | Status: DC | PRN
  Administered 2023-06-14 (×2): .5 mg via INTRAVENOUS

## 2023-06-14 MED ORDER — KETOROLAC TROMETHAMINE 30 MG/ML IJ SOLN
30.0000 mg | Freq: Four times a day (QID) | INTRAMUSCULAR | Status: DC
  Administered 2023-06-14 – 2023-06-15 (×3): 30 mg via INTRAVENOUS

## 2023-06-14 MED ORDER — DEXAMETHASONE SODIUM PHOSPHATE 10 MG/ML IJ SOLN
INTRAMUSCULAR | Status: DC | PRN
  Administered 2023-06-14: 10 mg via INTRAVENOUS

## 2023-06-14 MED ORDER — LACTATED RINGERS IV SOLN: INTRAVENOUS | Status: DC

## 2023-06-14 MED ORDER — LACTATED RINGERS IV SOLN: INTRAVENOUS | Status: DC | PRN

## 2023-06-14 MED ORDER — HYDROMORPHONE HCL 2 MG/ML IJ SOLN
INTRAMUSCULAR | Status: AC
  Filled 2023-06-14: qty 1

## 2023-06-14 MED ORDER — AMISULPRIDE (ANTIEMETIC) 5 MG/2ML IV SOLN
10.0000 mg | Freq: Once | INTRAVENOUS | Status: AC | PRN
  Administered 2023-06-14: 10 mg via INTRAVENOUS

## 2023-06-14 MED ORDER — POVIDONE-IODINE 10 % EX SWAB: 2.0000 | Freq: Once | CUTANEOUS | Status: DC

## 2023-06-14 MED ORDER — ARTIFICIAL TEARS OPHTHALMIC OINT
TOPICAL_OINTMENT | OPHTHALMIC | Status: AC
  Filled 2023-06-14: qty 3.5

## 2023-06-14 MED ORDER — ACETAMINOPHEN 500 MG PO TABS
ORAL_TABLET | ORAL | Status: AC
  Filled 2023-06-14: qty 2

## 2023-06-14 MED ORDER — 0.9 % SODIUM CHLORIDE (POUR BTL) OPTIME
TOPICAL | Status: DC | PRN
Start: 2023-06-14 — End: 2023-06-14
  Administered 2023-06-14: 500 mL

## 2023-06-14 MED ORDER — FENTANYL CITRATE (PF) 100 MCG/2ML IJ SOLN
25.0000 ug | INTRAMUSCULAR | Status: DC | PRN
  Administered 2023-06-14 (×3): 25 ug via INTRAVENOUS

## 2023-06-14 MED ORDER — SCOPOLAMINE 1 MG/3DAYS TD PT72
MEDICATED_PATCH | TRANSDERMAL | Status: AC
  Filled 2023-06-14: qty 1

## 2023-06-14 MED ORDER — FENTANYL CITRATE (PF) 100 MCG/2ML IJ SOLN
INTRAMUSCULAR | Status: DC | PRN
  Administered 2023-06-14: 100 ug via INTRAVENOUS
  Administered 2023-06-14 (×2): 50 ug via INTRAVENOUS

## 2023-06-14 MED ORDER — ONDANSETRON HCL 4 MG/2ML IJ SOLN
INTRAMUSCULAR | Status: AC
  Filled 2023-06-14: qty 2

## 2023-06-14 MED ORDER — OXYCODONE-ACETAMINOPHEN 5-325 MG PO TABS
ORAL_TABLET | ORAL | Status: AC
  Filled 2023-06-14: qty 1

## 2023-06-14 MED ORDER — ONDANSETRON HCL 4 MG/2ML IJ SOLN
INTRAMUSCULAR | Status: DC | PRN
  Administered 2023-06-14: 4 mg via INTRAVENOUS

## 2023-06-14 MED ORDER — PANTOPRAZOLE SODIUM 40 MG IV SOLR
INTRAVENOUS | Status: AC
Start: 2023-06-14 — End: ?
  Filled 2023-06-14: qty 10

## 2023-06-14 MED ORDER — SIMETHICONE 80 MG PO CHEW
CHEWABLE_TABLET | ORAL | Status: AC
  Filled 2023-06-14: qty 1

## 2023-06-14 MED ORDER — IBUPROFEN 200 MG PO TABS: 600.0000 mg | ORAL_TABLET | Freq: Four times a day (QID) | ORAL | Status: DC

## 2023-06-14 MED ORDER — ROCURONIUM BROMIDE 10 MG/ML (PF) SYRINGE
PREFILLED_SYRINGE | INTRAVENOUS | Status: AC
  Filled 2023-06-14: qty 10

## 2023-06-14 MED ORDER — DEXMEDETOMIDINE HCL IN NACL 80 MCG/20ML IV SOLN
INTRAVENOUS | Status: DC | PRN
  Administered 2023-06-14: 12 ug via INTRAVENOUS

## 2023-06-14 MED ORDER — SODIUM CHLORIDE 0.9 % IV SOLN
2.0000 g | INTRAVENOUS | Status: AC
  Administered 2023-06-14: 2 g via INTRAVENOUS

## 2023-06-14 MED ORDER — OXYCODONE HCL 5 MG/5ML PO SOLN: 5.0000 mg | Freq: Once | ORAL | Status: DC | PRN

## 2023-06-14 MED ORDER — PROPOFOL 10 MG/ML IV BOLUS
INTRAVENOUS | Status: DC | PRN
  Administered 2023-06-14: 200 mg via INTRAVENOUS

## 2023-06-14 MED ORDER — MORPHINE SULFATE (PF) 4 MG/ML IV SOLN
1.0000 mg | INTRAVENOUS | Status: DC | PRN
Start: 2023-06-14 — End: 2023-06-15
  Administered 2023-06-14: 2 mg via INTRAVENOUS

## 2023-06-14 MED ORDER — ROCURONIUM BROMIDE 10 MG/ML (PF) SYRINGE
PREFILLED_SYRINGE | INTRAVENOUS | Status: DC | PRN
  Administered 2023-06-14: 10 mg via INTRAVENOUS
  Administered 2023-06-14: 70 mg via INTRAVENOUS

## 2023-06-14 MED ORDER — DEXMEDETOMIDINE HCL IN NACL 80 MCG/20ML IV SOLN
INTRAVENOUS | Status: AC
  Filled 2023-06-14: qty 20

## 2023-06-14 MED ORDER — ONDANSETRON HCL 4 MG/2ML IJ SOLN
4.0000 mg | Freq: Four times a day (QID) | INTRAMUSCULAR | Status: DC | PRN
  Administered 2023-06-14 – 2023-06-15 (×3): 4 mg via INTRAVENOUS

## 2023-06-14 MED ORDER — MIDAZOLAM HCL 2 MG/2ML IJ SOLN
INTRAMUSCULAR | Status: AC
  Filled 2023-06-14: qty 2

## 2023-06-14 MED ORDER — SODIUM CHLORIDE 0.9 % IV SOLN
INTRAVENOUS | Status: DC | PRN
  Administered 2023-06-14: 60 mL

## 2023-06-14 MED ORDER — ACETAMINOPHEN 500 MG PO TABS
1000.0000 mg | ORAL_TABLET | ORAL | Status: AC
  Administered 2023-06-14: 1000 mg via ORAL

## 2023-06-14 MED ORDER — MENTHOL 3 MG MT LOZG: 1.0000 | LOZENGE | OROMUCOSAL | Status: DC | PRN

## 2023-06-14 MED ORDER — AMISULPRIDE (ANTIEMETIC) 5 MG/2ML IV SOLN
INTRAVENOUS | Status: AC
  Filled 2023-06-14: qty 4

## 2023-06-14 MED ORDER — SUGAMMADEX SODIUM 200 MG/2ML IV SOLN
INTRAVENOUS | Status: DC | PRN
  Administered 2023-06-14: 200 mg via INTRAVENOUS

## 2023-06-14 MED ORDER — STERILE WATER FOR IRRIGATION IR SOLN
Status: DC | PRN
  Administered 2023-06-14: 500 mL

## 2023-06-14 MED ORDER — GABAPENTIN 100 MG PO CAPS
100.0000 mg | ORAL_CAPSULE | ORAL | Status: AC
  Administered 2023-06-14: 100 mg via ORAL

## 2023-06-14 MED ORDER — KETOROLAC TROMETHAMINE 30 MG/ML IJ SOLN
INTRAMUSCULAR | Status: AC
  Filled 2023-06-14: qty 1

## 2023-06-14 MED ORDER — SODIUM CHLORIDE 0.9 % IV SOLN
INTRAVENOUS | Status: AC
  Filled 2023-06-14: qty 2

## 2023-06-14 MED ORDER — ONDANSETRON HCL 4 MG PO TABS: 4.0000 mg | ORAL_TABLET | Freq: Four times a day (QID) | ORAL | Status: DC | PRN

## 2023-06-14 MED ORDER — KETOROLAC TROMETHAMINE 30 MG/ML IJ SOLN
INTRAMUSCULAR | Status: DC | PRN
  Administered 2023-06-14: 30 mg via INTRAVENOUS

## 2023-06-14 MED ORDER — SODIUM CHLORIDE 0.9 % IR SOLN
Status: DC | PRN
  Administered 2023-06-14: 1000 mL via INTRAVESICAL

## 2023-06-14 MED ORDER — LIDOCAINE 2% (20 MG/ML) 5 ML SYRINGE
INTRAMUSCULAR | Status: DC | PRN
  Administered 2023-06-14: 100 mg via INTRAVENOUS

## 2023-06-14 MED ORDER — PANTOPRAZOLE SODIUM 40 MG IV SOLR
40.0000 mg | Freq: Every day | INTRAVENOUS | Status: DC
  Administered 2023-06-14: 40 mg via INTRAVENOUS

## 2023-06-14 MED ORDER — MORPHINE SULFATE (PF) 2 MG/ML IV SOLN
INTRAVENOUS | Status: AC
  Filled 2023-06-14: qty 1

## 2023-06-14 MED ORDER — OXYCODONE-ACETAMINOPHEN 5-325 MG PO TABS
ORAL_TABLET | ORAL | Status: AC
  Filled 2023-06-14: qty 2

## 2023-06-14 MED ORDER — GABAPENTIN 100 MG PO CAPS
ORAL_CAPSULE | ORAL | Status: AC
  Filled 2023-06-14: qty 1

## 2023-06-14 MED ORDER — ALUM & MAG HYDROXIDE-SIMETH 200-200-20 MG/5ML PO SUSP: 30.0000 mL | ORAL | Status: DC | PRN

## 2023-06-14 MED ORDER — MIDAZOLAM HCL 5 MG/5ML IJ SOLN
INTRAMUSCULAR | Status: DC | PRN
  Administered 2023-06-14: 2 mg via INTRAVENOUS

## 2023-06-14 SURGICAL SUPPLY — 71 items
ADH SKN CLS APL DERMABOND .7 (GAUZE/BANDAGES/DRESSINGS) ×2
APL ESCP 73.6OZ SRGCL (TIP)
APL PRP STRL LF DISP 70% ISPRP (MISCELLANEOUS) ×2
APL SRG 38 LTWT LNG FL B (MISCELLANEOUS) ×2
APL SWBSTK 6 STRL LF DISP (MISCELLANEOUS) ×2
APPLICATOR ARISTA FLEXITIP XL (MISCELLANEOUS) IMPLANT
APPLICATOR COTTON TIP 6 STRL (MISCELLANEOUS) ×2 IMPLANT
APPLICATOR COTTON TIP 6IN STRL (MISCELLANEOUS) ×2
BLADE SURG 10 STRL SS (BLADE) IMPLANT
CABLE HIGH FREQUENCY MONO STRZ (ELECTRODE) IMPLANT
CELL SAVER LIPIGURD (MISCELLANEOUS) IMPLANT
CHLORAPREP W/TINT 26 (MISCELLANEOUS) ×2 IMPLANT
COVER BACK TABLE 60X90IN (DRAPES) ×2 IMPLANT
COVER MAYO STAND STRL (DRAPES) ×2 IMPLANT
COVER SURGICAL LIGHT HANDLE (MISCELLANEOUS) IMPLANT
DERMABOND ADVANCED .7 DNX12 (GAUZE/BANDAGES/DRESSINGS) ×2 IMPLANT
DEVICE RETRIEVAL ALEXIS 14 (MISCELLANEOUS) IMPLANT
DILATOR CANAL MILEX (MISCELLANEOUS) IMPLANT
DRAPE SURG IRRIG POUCH 19X23 (DRAPES) ×2 IMPLANT
EXTRT SYSTEM ALEXIS 14CM (MISCELLANEOUS)
EXTRT SYSTEM ALEXIS 17CM (MISCELLANEOUS)
GAUZE 4X4 16PLY ~~LOC~~+RFID DBL (SPONGE) ×4 IMPLANT
GLOVE BIO SURGEON STRL SZ 6.5 (GLOVE) ×2 IMPLANT
GLOVE BIOGEL PI IND STRL 6.5 (GLOVE) ×2 IMPLANT
GLOVE BIOGEL PI IND STRL 7.0 (GLOVE) ×4 IMPLANT
GLOVE ECLIPSE 6.5 STRL STRAW (GLOVE) ×4 IMPLANT
GOWN STRL REUS W/TWL XL LVL3 (GOWN DISPOSABLE) ×4 IMPLANT
HEMOSTAT ARISTA ABSORB 3G PWDR (HEMOSTASIS) IMPLANT
IRRIG SUCT STRYKERFLOW 2 WTIP (MISCELLANEOUS) ×2
IRRIGATION SUCT STRKRFLW 2 WTP (MISCELLANEOUS) ×4 IMPLANT
KIT PINK PAD W/HEAD ARE REST (MISCELLANEOUS) ×2
KIT PINK PAD W/HEAD ARM REST (MISCELLANEOUS) ×2 IMPLANT
KIT TURNOVER CYSTO (KITS) ×2 IMPLANT
LEGGING LITHOTOMY PAIR STRL (DRAPES) ×2 IMPLANT
LIGASURE VESSEL 5MM BLUNT TIP (ELECTROSURGICAL) ×2 IMPLANT
NDL INSUFFLATION 14GA 120MM (NEEDLE) ×2 IMPLANT
NEEDLE INSUFFLATION 14GA 120MM (NEEDLE) ×2 IMPLANT
NS IRRIG 1000ML POUR BTL (IV SOLUTION) ×2 IMPLANT
OCCLUDER COLPOPNEUMO (BALLOONS) ×2 IMPLANT
PACK LAPAROSCOPY BASIN (CUSTOM PROCEDURE TRAY) ×2 IMPLANT
PENCIL SMOKE EVACUATOR (MISCELLANEOUS) IMPLANT
POUCH LAPAROSCOPIC INSTRUMENT (MISCELLANEOUS) ×2 IMPLANT
POWDER SURGICEL 3.0 GRAM (HEMOSTASIS) IMPLANT
PROTECTOR NERVE ULNAR (MISCELLANEOUS) ×2 IMPLANT
SCISSORS LAP 5X35 DISP (ENDOMECHANICALS) IMPLANT
SET IRRIG Y TYPE TUR BLADDER L (SET/KITS/TRAYS/PACK) ×2 IMPLANT
SET TRI-LUMEN FLTR TB AIRSEAL (TUBING) ×2 IMPLANT
SHEARS HARMONIC 36 ACE (MISCELLANEOUS) ×2 IMPLANT
SLEEVE SCD COMPRESS KNEE MED (STOCKING) ×2 IMPLANT
SUT VIC AB 0 CT1 27 (SUTURE) ×4
SUT VIC AB 0 CT1 27XBRD ANBCTR (SUTURE) ×4 IMPLANT
SUT VIC AB 4-0 PS2 18 (SUTURE) ×2 IMPLANT
SUT VICRYL 0 UR6 27IN ABS (SUTURE) IMPLANT
SUT VLOC 180 0 9IN GS21 (SUTURE) ×2 IMPLANT
SYR 10ML LL (SYRINGE) ×2 IMPLANT
SYR 50ML LL SCALE MARK (SYRINGE) ×4 IMPLANT
SYSTEM CARTER THOMASON II (TROCAR) IMPLANT
SYSTEM CONTND EXTRCTN KII BLLN (MISCELLANEOUS) IMPLANT
TIP ENDOSCOPIC SURGICEL (TIP) IMPLANT
TIP UTERINE 5.1X6CM LAV DISP (MISCELLANEOUS) IMPLANT
TIP UTERINE 6.7X10CM GRN DISP (MISCELLANEOUS) IMPLANT
TIP UTERINE 6.7X6CM WHT DISP (MISCELLANEOUS) IMPLANT
TIP UTERINE 6.7X8CM BLUE DISP (MISCELLANEOUS) IMPLANT
TOWEL OR 17X24 6PK STRL BLUE (TOWEL DISPOSABLE) ×4 IMPLANT
TRAY FOLEY W/BAG SLVR 14FR LF (SET/KITS/TRAYS/PACK) ×2 IMPLANT
TROCAR ADV FIXATION 5X100MM (TROCAR) ×2 IMPLANT
TROCAR KII 8X100ML NONTHREADED (TROCAR) ×2 IMPLANT
TROCAR PORT AIRSEAL 5X120 (TROCAR) ×2 IMPLANT
TROCAR Z-THREAD FIOS 5X100MM (TROCAR) ×2 IMPLANT
WARMER LAPAROSCOPE (MISCELLANEOUS) ×2 IMPLANT
WATER STERILE IRR 3000ML UROMA (IV SOLUTION) ×2 IMPLANT

## 2023-06-14 NOTE — Anesthesia Postprocedure Evaluation (Signed)
Anesthesia Post Note  Patient: [name]  Procedure(s) Performed: TOTAL LAPAROSCOPIC HYSTERECTOMY WITH BILATERAL SALPINGO OOPHORECTOMY (Bilateral) CYSTOSCOPY     Patient location during evaluation: PACU Anesthesia Type: General Level of consciousness: awake Pain management: pain level controlled Vital Signs Assessment: post-procedure vital signs reviewed and stable Respiratory status: spontaneous breathing, nonlabored ventilation and respiratory function stable Cardiovascular status: blood pressure returned to baseline and stable Postop Assessment: no apparent nausea or vomiting Anesthetic complications: no   No notable events documented.  Last Vitals:  Vitals:   06/14/23 1510 06/14/23 1514  BP:    Pulse: 98 95  Resp: 18 18  Temp:    SpO2: 95% 94%    Last Pain:  Vitals:   06/14/23 1510  TempSrc:   PainSc: 5                  Linton Rump

## 2023-06-14 NOTE — Progress Notes (Signed)
GYNECOLOGY  VISIT  CC:   discuss ultrasound and possible surgery  HPI: 37 y.o. [id number] Divorced Black or [place] American female here for follow up after ultrasound.  Pt has hx of fibroid uterus, menorrhagia with irregular bleeding, prior hx of sonata RFA.  She has continued to have bleeding that has required daily progesterone use.  Although bleeding is better, she is tired of this.  Ultrasound today showed uterus 11 x 7.5 x 6.3cm with fibroid uterus, largest 3.7.  This is centrally located and possibly has a submucosal component.  There does appear to be decrease in fibroid size compared to before the Sonata.  Ovaries appear normal.  With this information, pt is desirous of proceeding with planned surgery.  Procedure discussed with patient.  Her mother was present for appt as well.  Hospital stay, recovery and pain management all discussed.  Risks discussed including but not limited to bleeding, 1% risk of receiving a  transfusion, infection, 3-4% risk of bowel/bladder/ureteral/vascular injury discussed as well as possible need for additional surgery if injury does occur discussed.  DVT/PE and rare risk of death discussed.  My actual complications with prior surgeries discussed.  Vaginal cuff dehiscence discussed.  Hernia formation discussed.  Positioning and incision locations discussed.  Patient aware if pathology abnormal she may need additional treatment.  All questions answered.    Past Medical History:  Diagnosis Date   Anemia    Follows w/ PCP, Dr. Janit Pagan @ [facility].   Anxiety    DUB (dysfunctional uterine bleeding) 2023   Headache    chronic migraines, follows with Shon Millet, MD, neurology   HSV (herpes simplex virus) infection    Hx of chlamydia infection    Hx of gonorrhea     MEDS:   Current Outpatient Medications on File Prior to Visit  Medication Sig Dispense Refill   albuterol (VENTOLIN HFA) 108 (90 Base) MCG/ACT inhaler INHALE 1 TO 2 PUFFS INTO THE  LUNGS EVERY 6 HOURS AS NEEDED FOR WHEEZING OR SHORTNESS OF BREATH Strength: 108 (90 Base) MCG/ACT (Patient not taking: Reported on 05/31/2023) 8 g 1   amLODipine (NORVASC) 5 MG tablet Take 1 tablet (5 mg total) by mouth daily. 90 tablet 1   Blood Pressure Monitoring (BLOOD PRESSURE CUFF) MISC 1 Units by Does not apply route daily. Upper arm cuff 1 each 0   calcium carbonate (TUMS - DOSED IN MG ELEMENTAL CALCIUM) 500 MG chewable tablet Chew 1-2 tablets by mouth 2 (two) times daily as needed.     Erenumab-aooe (AIMOVIG) 140 MG/ML SOAJ Inject 140 mg into the skin every 28 (twenty-eight) days. 1.12 mL 11   gabapentin (NEURONTIN) 100 MG capsule TAKE 1 CAPSULE BY MOUTH EVERY 8 HOURS AS NEEDED FOR PAIN 60 capsule 1   HYDROcodone-acetaminophen (NORCO/VICODIN) 5-325 MG tablet Take 1 tablet by mouth every 6 (six) hours as needed for moderate pain. (Patient not taking: Reported on 05/31/2023) 15 tablet 0   hydrOXYzine (ATARAX) 25 MG tablet TAKE 1 TABLET BY MOUTH 3 TIMES DAILY AS NEEDED FOR ANXIETY 30 tablet 3   megestrol (MEGACE) 40 MG tablet Take 1 tablet (40 mg total) by mouth 2 (two) times daily. 60 tablet 3   ondansetron (ZOFRAN-ODT) 4 MG disintegrating tablet DISSOLVE ONE TABLET in MOUTH EVERY 8 HOURS AS NEEDED FOR NAUSEA OR FOR VOMITING 20 tablet 3   Relugolix-Estradiol-Norethind (MYFEMBREE) 40-1-0.5 MG TABS Take 1 tablet by mouth daily. 28 tablet 4   SUMAtriptan (IMITREX) 100 MG tablet  Take 1 tablet at earliest onset of migraine.  May repeat in 2 hours if headache persists or recurs.  Maximum 2 tablets in 24 hours 10 tablet 5   UBRELVY 100 MG TABS TAKE 1 TABLET BY MOUTH AS NEEDED. Take 1 TABLET AT THE earliest ONSET OF A migraine. May repeat in 2 hours. Max 2 ts in 24 hours 16 tablet 5   No current facility-administered medications on file prior to visit.    ALLERGIES: Coconut flavor, Other, and Tomato  SH:  single, non smoker  ROS  PHYSICAL EXAMINATION:    Physical Exam Constitutional:       Appearance: Normal appearance.  Neurological:     General: No focal deficit present.     Mental Status: She is alert.  Psychiatric:        Mood and Affect: Mood normal.     Assessment/Plan: 1. Menorrhagia with irregular cycle - TLH with bilateral salpingectomy is planned.  Procedure, risks and benefits discussed.  Medications/supplements reviewed.  Pre and post op care/instructions reviewed.  Questions answered. - she will plan to stop megace and myfembree after surgery  2. Intramural and submucous leiomyoma of uterus  3. History of iron deficiency anemia   Total time with pt and her mother in discussion:  26 minutes

## 2023-06-14 NOTE — Anesthesia Preprocedure Evaluation (Addendum)
Anesthesia Evaluation  Patient identified by MRN, date of birth, ID band Patient awake    Reviewed: Allergy & Precautions, NPO status , Patient's Chart, lab work & pertinent test results  History of Anesthesia Complications Negative for: history of anesthetic complications  Airway Mallampati: II  TM Distance: >3 FB Neck ROM: Full   Comment: Previous grade I view with MAC 3, easy mask Dental  (+) Dental Advisory Given   Pulmonary neg shortness of breath, asthma (has not used inhaler in >1 year) , neg sleep apnea, neg COPD, neg recent URI   Pulmonary exam normal breath sounds clear to auscultation       Cardiovascular hypertension (amlodipine), Pt. on medications (-) angina (-) Past MI, (-) Cardiac Stents and (-) CABG (-) dysrhythmias  Rhythm:Regular Rate:Normal     Neuro/Psych  Headaches, neg Seizures PSYCHIATRIC DISORDERS Anxiety        GI/Hepatic negative GI ROS, Neg liver ROS,,,  Endo/Other  negative endocrine ROS    Renal/GU negative Renal ROS     Musculoskeletal   Abdominal  (+) + obese  Peds  Hematology  (+) Blood dyscrasia, anemia Lab Results      Component                Value               Date                      WBC                      5.0                 06/08/2023                HGB                      12.9                06/08/2023                HCT                      37.9                06/08/2023                MCV                      86.5                06/08/2023                PLT                      318                 06/08/2023              Anesthesia Other Findings   Reproductive/Obstetrics                              Anesthesia Physical Anesthesia Plan  ASA: 2  Anesthesia Plan: General   Post-op Pain Management: Tylenol PO (pre-op)*   Induction: Intravenous  PONV Risk Score and Plan: 3 and Ondansetron, Dexamethasone, Midazolam, Scopolamine patch -  Pre-op and  Treatment may vary due to age or medical condition  Airway Management Planned: Oral ETT  Additional Equipment:   Intra-op Plan:   Post-operative Plan: Extubation in OR  Informed Consent: I have reviewed the patients History and Physical, chart, labs and discussed the procedure including the risks, benefits and alternatives for the proposed anesthesia with the patient or authorized representative who has indicated his/her understanding and acceptance.     Dental advisory given  Plan Discussed with: CRNA and Anesthesiologist  Anesthesia Plan Comments: (Risks of general anesthesia discussed including, but not limited to, sore throat, hoarse voice, chipped/damaged teeth, injury to vocal cords, nausea and vomiting, allergic reactions, lung infection, heart attack, stroke, and death. All questions answered. )         Anesthesia Quick Evaluation

## 2023-06-14 NOTE — H&P (Signed)
[name] is an 37 y.o. female G37P6 AA female with hx of menorrhagia, history of iron deficiency anemia and fibroid uterus here for definitive treatment of these issues.  Her fibroid uterus was treated earlier this year with Sonata radiofrequency ablation.  Although her bleeding is some improved, she has needed daily progesterone for control and she has experienced a significant amount of irregular bleeding.  She has also tried [employer]  Pt did undergo ultrasound 10/2 showing ~4cm fibroid that possibly has a submucosal component.  There is improvement from ultrasound done prior to the Sonata procedure in number and size of fibroids.  She is ready to not have any more bleeding so definitive surgeyr with TLh/bilateral salpingectomy/cystoscopy is planned.  Risks and benefits have been reviewed.  She is not interested in any other alternative treatments at this time.    Pertinent Gynecological History: Menses:  irregular with heavy flow at times Contraception: tubal ligation DES exposure: denies Blood transfusions: none Sexually transmitted diseases: H/o GC and chl as well as HSV Previous GYN Procedures:  Sonata RFA treatment of fibroids   Last mammogram: n/a Last pap: normal Date: 07/07/2022 OB History: G7, P6     Past Medical History:  Diagnosis Date   Anemia    Follows w/ PCP, Dr. Janit Pagan @ [facility].   Anxiety    DUB (dysfunctional uterine bleeding) 2023   Headache    chronic migraines, follows with Shon Millet, MD, neurology   HSV (herpes simplex virus) infection    Hx of chlamydia infection    Hx of gonorrhea     Past Surgical History:  Procedure Laterality Date   HYSTEROSCOPY WITH D & C  10/19/2022   w/radio frequency ablation of fibroids w/ Sonata   LAPAROSCOPIC TUBAL LIGATION Bilateral 11/14/2016   Procedure: LAPAROSCOPIC TUBAL LIGATION;  Surgeon: Huel Cote, MD;  Location: [facility];  Service: Gynecology;  Laterality: Bilateral;    Family  History  Problem Relation Age of Onset   Hypertension Mother    Stroke Mother    Diabetes Paternal Grandfather    Alcohol abuse Maternal Uncle    Drug abuse Maternal Uncle    Asthma Paternal Uncle    Asthma Child     Social History:  reports that she has never smoked. She has never used smokeless tobacco. She reports that she does not currently use alcohol. She reports that she does not use drugs.  Allergies:  Allergies  Allergen Reactions   Coconut Flavor Itching and Swelling   Other Swelling    Sour Cream and Onion together cause lip swelling.   Tomato Swelling and Rash    Skin contact with tomato causes rash and swelling. Patient can eat tomatoes.    No medications prior to admission.    Review of Systems  Weight 85.7 kg. Physical Exam Constitutional:      Appearance: Normal appearance.  Cardiovascular:     Rate and Rhythm: Normal rate and regular rhythm.  Pulmonary:     Effort: Pulmonary effort is normal.     Breath sounds: Normal breath sounds.  Skin:    General: Skin is warm and dry.  Neurological:     General: No focal deficit present.     Mental Status: She is alert.  Psychiatric:        Mood and Affect: Mood normal.     No results found for this or any previous visit (from the past 24 hour(s)).  No results found.  Assessment/Plan:  37 yo G3P6 DAAF with h/o fibroid uterus, menorrhagia with irregular bleeding, h/o iron deficiency anemia here for definitive treatment.  She has failed oral progesterone therapy, myfembree therapy as well as Sonta RFA treatment.  Pt is ready to proceed.  Jerene Bears 06/14/2023, 10:27 AM

## 2023-06-14 NOTE — Anesthesia Procedure Notes (Signed)
Procedure Name: Intubation Date/Time: 06/14/2023 12:11 PM  Performed by: Bishop Limbo, CRNAPre-anesthesia Checklist: Patient identified, Emergency Drugs available, Suction available and Patient being monitored Patient Re-evaluated:Patient Re-evaluated prior to induction Oxygen Delivery Method: Circle System Utilized Preoxygenation: Pre-oxygenation with 100% oxygen Induction Type: IV induction Ventilation: Mask ventilation without difficulty Laryngoscope Size: Mac and 3 Grade View: Grade I Tube type: Oral Tube size: 7.0 mm Number of attempts: 1 Airway Equipment and Method: Stylet Placement Confirmation: ETT inserted through vocal cords under direct vision, positive ETCO2 and breath sounds checked- equal and bilateral Secured at: 22 cm Tube secured with: Tape Dental Injury: Teeth and Oropharynx as per pre-operative assessment

## 2023-06-14 NOTE — Op Note (Signed)
06/14/2023  2:34 PM  PATIENT:  [name]  37 y.o. female  PRE-OPERATIVE DIAGNOSIS:  Menorrhagia Fibroids, h/o iron deficiency anemia, failed Sonata procedure  POST-OPERATIVE DIAGNOSIS:  Menorrhagia Fibroids, h/o iron deficiency anemia, failed Sonata procedure  PROCEDURE:  Procedure(s): TOTAL LAPAROSCOPIC HYSTERECTOMY WITH BILATERAL SALPINGO OOPHORECTOMY CYSTOSCOPY  SURGEON:  Jerene Bears  ASSISTANTS: Elgin Bing.  An experienced assistant was required given the standard of surgical care given the complexity of the case.  This assistant was needed for exposure, dissection, suctioning, retraction, instrument exchange and for overall help during the procedure.    ANESTHESIA:   general  ESTIMATED BLOOD LOSS: 50 mL  BLOOD ADMINISTERED:none   FLUIDS: 500cc LR  UOP: 200cc clear but concentrated UOP  SPECIMEN:  uterus, cervix and bilateral fallopian tubes  DISPOSITION OF SPECIMEN:  PATHOLOGY  FINDINGS: Bulky uterus, normal ovaries, normal upper abdomen, partially removed fallopian tubes due to prior BTL  DESCRIPTION OF OPERATION: Patient is taken to the operating room. She is placed in the supine position. She is a running IV in place. Informed consent was present on the chart. SCDs on her lower extremities and functioning properly. Patient was positioned while she was awake.  Her legs were placed in the low lithotomy position in [place] stirrups. Her arms were tucked by the side.  General endotracheal anesthesia was administered by the anesthesia staff without difficulty. Dr. Isaias Cowman, anesthesia, oversaw case.  Time out performed.    Clora prep was then used to prep the abdomen and Hibiclens was used to prep the inner thighs, perineum and vagina. Once 3 minutes had past the patient was draped in a normal standard fashion. The legs were lifted to the high lithotomy position. The cervix was visualized by placing a heavy weighted speculum in the posterior aspect of the vagina and  using a curved Deaver retractor to the retract anteriorly. The anterior lip of the cervix was grasped with single-tooth tenaculum.  The cervix sounded to 9 cm. Pratt dilators were used to dilate the cervix up to a #21. A RUMI uterine manipulator was obtained. A #8 disposable tip was placed on the RUMI manipulator as well as a 3.5, silver KOH ring. This was passed through the cervix and the bulb of the disposable tip was inflated with 10 cc of normal saline. There was a good fit of the KOH ring around the cervix. The tenaculum was removed. There is also good manipulation of the uterus. The speculum and retractor were removed as well. A Foley catheter was placed to straight drain.  Clear urine was noted. Legs were lowered to the low lithotomy position and attention was turned the abdomen.  The umbilicus was everted.  Small hernia in middle of umbilicus noted.  Marcaine 0.25% used to anesthetize the skin.  Using #11 blade, 5mm skin incision was made.  A Veress needle was obtained. Syringe of sterile saline was placed on a open Veress needle.  With the abdomen elevated, the Veress needle was passed into the umbilicus until the pop was heard and then fluid started to drip.  Then low flow CO2 gas was attached the needle and the pneumoperitoneum was achieved without difficulty. Once 3.0 liters of gas was in the abdomen the Veress needle was removed and a 5 millimeter non-bladed Optiview trocar and port were passed directly to the abdomen. The laparoscope was then used to confirm intraperitoneal placement. Findings noted above.  Locations for RLQ, LLQ, and suprapubic ports were noted by transillumination of the abdominal  wall.  0.25% marcaine was used to anesthetize the skin.  8mm skin incision was made in the RLQ and an AirSeal port was placed underdirect visualization of the laparoscope.  Then a 5mm skin incision was made and a 5mm nonbladed trochar and port was placed in the LLQ.  Finally, and 8mm skin incision was  made about 4cm above the pubic symphasis and an 8mm non-bladed port was placed with direct visualization of the laparoscope.  All trochars were removed.    Ureters were identified.  Attention was turned to the left side. With uterus on stretch the left tube was excised off the ovary and mesosalpinx was dissected to free the tube. Then the left utero-ovarian pedicle was serially clamped cauterized and incised using the ligasure device. Left round ligament was serially clamped cauterized and incised. The anterior and posterior peritoneum of the inferior leaf of the broad ligament were opened. The beginning of the bladder flap was created.  The bladder was taken down below the level of the KOH ring. The left uterine artery skeletonized and then just superior to the KOH ring this vessel was serially clamped, cauterized, and incised.  Attention was turned the right side.  The uterus was placed on stretch to the opposite side.  The tube was excised off the ovary using sharp dissection a bipolar cautery.  The mesosalpinx was incised freeing the tube. Then the right uterine ovarian pedicle was serially clamped cauterized and incised. Next the right round ligament was serially clamped cauterized and incised. The anterior posterior peritoneum of the inferiorly for the broad ligament were opened. The anterior peritoneum was carried across to the dissection on the left side. The remainder of the bladder flap was created using sharp dissection. The bladder was well below the level of the KOH ring. The right uterine artery skeletonized. Then the right uterine artery, above the level of the KOH ring, was serially clamped cauterized and incised. The uterus was devascularized at this point.  The colpotomy was performed a starting in the midline and using a harmonic scalpel with the inferior edge of the open blade  This was carried around a circumferential fashion until the vaginal mucosa was completely incised in the specimen  was freed.  The specimen was then delivered to the vagina.  A vaginal occlusive device was used to maintain the pneumoperitoneum  Instruments were changed with a needle driver and Kobra graspers.  Using a 9 inch V. lock suture, the cuff was closed by incorporating the anterior and posterior vaginal mucosa in each stitch. This was carried across all the way to the left corner and a running fashion. Two stitches were brought back towards the midline and the suture was cut flush with the vagina. The needle was brought out the pelvis. The pelvis was irrigated. All pedicles were inspected. No bleeding was noted.   Co2 pressures were lowered to 8mm Hg.  Again, no bleeding was noted.  Ureters were noted deep in the pelvis to be peristalsing.  Arista was placed along the pedicles.  At this point the procedure was completed.  The remaining instruments were removed.  The ports (except the suprapubic port) were removed under direct visualization of the laparoscope and the pneumoperitoneum was relieved.  The patient was taken out of Trendelenburg positioning.  Several deep breaths were given to the patient's trying to any gas the abdomen and finally the suprapubic port was removed.  The umbilical hernia was closed with a #0 Vicryl.  Then the skin  was closed with subcuticular stitches of 3-0 Vicryl. The skin was cleansed Dermabond was applied. Attention was then turned the vagina and the cuff was inspected. No bleeding was noted. The anterior posterior vaginal mucosa was incorporated in each stitch. The Foley catheter was removed.  Cystoscopy was performed.  No sutures or bladder injuries were noted.  Ureters were noted with normal urine jets from each one was seen.  Foley was left out after the cystoscopic fluid was drained and cystoscope removed.  Sponge, lap, needle, instrument counts were correct x2. Patient tolerated the procedure very well. She was awakened from anesthesia, extubated and taken to recovery in stable  condition.   COUNTS:  YES  PLAN OF CARE: Transfer to PACU

## 2023-06-14 NOTE — Transfer of Care (Signed)
Immediate Anesthesia Transfer of Care Note  Patient: [name]  Procedure(s) Performed: TOTAL LAPAROSCOPIC HYSTERECTOMY WITH BILATERAL SALPINGO OOPHORECTOMY (Bilateral) CYSTOSCOPY  Patient Location: PACU  Anesthesia Type:General  Level of Consciousness: awake, alert , oriented, and patient cooperative  Airway & Oxygen Therapy: Patient Spontanous Breathing and Patient connected to face mask oxygen  Post-op Assessment: Report given to RN and Post -op Vital signs reviewed and stable  Post vital signs: Reviewed and stable  Last Vitals:  Vitals Value Taken Time  BP 135/91 06/14/23 1430  Temp 36.7 C 06/14/23 1430  Pulse 93 06/14/23 1436  Resp 19 06/14/23 1436  SpO2 99 % 06/14/23 1436  Vitals shown include unfiled device data.  Last Pain:  Vitals:   06/14/23 1102  TempSrc: Oral  PainSc: 5       Patients Stated Pain Goal: 5 (06/14/23 1102)  Complications: No notable events documented.

## 2023-06-15 DIAGNOSIS — N920 Excessive and frequent menstruation with regular cycle: Secondary | ICD-10-CM | POA: Diagnosis not present

## 2023-06-15 LAB — HEMOGLOBIN: Hemoglobin: 12.7 g/dL (ref 12.0–15.0)

## 2023-06-15 MED ORDER — KETOROLAC TROMETHAMINE 30 MG/ML IJ SOLN
INTRAMUSCULAR | Status: AC
  Filled 2023-06-15: qty 1

## 2023-06-15 MED ORDER — OXYCODONE-ACETAMINOPHEN 5-325 MG PO TABS
1.0000 | ORAL_TABLET | Freq: Four times a day (QID) | ORAL | 0 refills | Status: AC | PRN
Start: 2023-06-15 — End: 2023-06-20

## 2023-06-15 MED ORDER — OXYCODONE-ACETAMINOPHEN 5-325 MG PO TABS
ORAL_TABLET | ORAL | Status: AC
  Filled 2023-06-15: qty 1

## 2023-06-15 MED ORDER — ONDANSETRON HCL 4 MG/2ML IJ SOLN
INTRAMUSCULAR | Status: AC
  Filled 2023-06-15: qty 2

## 2023-06-15 MED ORDER — ONDANSETRON 4 MG PO TBDP: 4.0000 mg | ORAL_TABLET | Freq: Three times a day (TID) | ORAL | 0 refills | Status: DC | PRN

## 2023-06-15 MED ORDER — IBUPROFEN 800 MG PO TABS: 800.0000 mg | ORAL_TABLET | Freq: Three times a day (TID) | ORAL | 0 refills | Status: DC | PRN

## 2023-06-15 NOTE — Progress Notes (Signed)
1 Day Post-Op Procedure(s) (LRB): TOTAL LAPAROSCOPIC HYSTERECTOMY WITH BILATERAL SALPINGO OOPHORECTOMY (Bilateral) CYSTOSCOPY (N/A)  Subjective: Patient reports nausea has resolved.  Did have a bad headache last night.  Vomited x 1 but is feeling much, much better.  Voiding without difficulty.  No vaginal bleeding.  Burping.  Objective: I have reviewed patient's vital signs, intake and output, medications, and labs. Vitals:   06/15/23 0223 06/15/23 0604  BP: 134/75 118/76  Pulse: 98 79  Resp: 18   Temp: 98.3 F (36.8 C) 98.4 F (36.9 C)  SpO2: 98% 98%    General: alert, cooperative, and no distress Resp: clear to auscultation bilaterally Cardio: regular rate and rhythm, S1, S2 normal, no murmur, click, rub or gallop GI: soft, non-tender; bowel sounds normal; no masses,  no organomegaly and incision: clean, dry, and intact Extremities: extremities normal, atraumatic, no cyanosis or edema Vaginal Bleeding: none  Assessment: s/p Procedure(s): TOTAL LAPAROSCOPIC HYSTERECTOMY WITH BILATERAL SALPINGO OOPHORECTOMY (Bilateral) CYSTOSCOPY (N/A): stable and progressing well  Plan: Advance diet Discontinue IV fluids Discharge home  LOS: 0 days    Jerene Bears, MD 06/15/2023, 7:43 AM

## 2023-06-15 NOTE — Discharge Instructions (Signed)
Post Op Hysterectomy Instructions Please read the instructions below. Refer to these instructions for the next few weeks. These instructions provide you with general information on caring for yourself after surgery. Your caregiver may also give you specific instructions. While your treatment has been planned according to the most current medical practices available, unavoidable problems sometimes happen. If you have any problems or questions after you leave, please call your caregiver.  HOME CARE INSTRUCTIONS Healing will take time. You will have discomfort, tenderness, swelling and bruising at the operative site for a couple of weeks. This is normal and will get better as time goes on.  Only take over-the-counter or prescription medicines for pain, discomfort or fever as directed by your caregiver.  Do not take aspirin. It can cause bleeding.  Do not drive when taking pain medication.  Follow your caregiver's advice regarding diet, exercise, lifting, driving and general activities.  Resume your usual diet as directed and allowed.  Get plenty of rest and sleep.  Do not douche, use tampons, or have sexual intercourse until your caregiver gives you permission. .  Take your temperature if you feel hot or flushed.  You may shower today when you get home.  No tub bath for one week.   Do not drink alcohol until you are not taking any narcotic pain medications.  Try to have someone home with you for a week or two to help with the household activities.   Be careful over the next two to three weeks with any activities at home that involve lifting, pushing, or pulling.  Listen to your body--if something feels uncomfortable to do, then don't do it. Make sure you and your family understands everything about your operation and recovery.  Walking up stairs is fine. Do not sign any legal documents until you feel normal again.  Keep all your follow-up appointments as recommended by your caregiver.   PLEASE CALL  THE OFFICE IF: There is swelling, redness or increasing pain in the wound area.  Pus is coming from the wound.  You notice a bad smell from the wound or surgical dressing.  You have pain, redness and swelling from the intravenous site.  The wound is breaking open (the edges are not staying together).   You develop pain or bleeding when you urinate.  You develop abnormal vaginal discharge.  You have any type of abnormal reaction or develop an allergy to your medication.  You need stronger pain medication for your pain   SEEK IMMEDIATE MEDICAL CARE: You develop a temperature of 100.5 or higher.  You develop abdominal pain.  You develop chest pain.  You develop shortness of breath.  You pass out.  You develop pain, swelling or redness of your leg.  You develop heavy vaginal bleeding with or without blood clots.   MEDICATIONS: Restart your regular medications BUT wait one week before restarting all vitamins and mineral supplements Use Motrin 800mg  every 8 hours for the next several days.  This will help you use less Percocet.  Use the Percocet 5/325 1-2 tabs every 4-6 hours as needed for pain. You may use an over the counter stool softener like Colace or Dulcolax to help with starting a bowel movement.  Start the day after you go home.  Warm liquids, fluids, and ambulation help too.  If you have not had a bowel movement in four days, you need to call the office.  Do not use laxatives to help with constipation.  This will cause a lot  of cramping and pain.

## 2023-06-16 ENCOUNTER — Encounter (HOSPITAL_BASED_OUTPATIENT_CLINIC_OR_DEPARTMENT_OTHER): Payer: Self-pay | Admitting: Obstetrics & Gynecology

## 2023-06-19 LAB — SURGICAL PATHOLOGY

## 2023-06-22 ENCOUNTER — Ambulatory Visit (HOSPITAL_BASED_OUTPATIENT_CLINIC_OR_DEPARTMENT_OTHER): Payer: Medicaid Other | Admitting: Obstetrics & Gynecology

## 2023-06-22 ENCOUNTER — Encounter (HOSPITAL_BASED_OUTPATIENT_CLINIC_OR_DEPARTMENT_OTHER): Payer: Self-pay | Admitting: Obstetrics & Gynecology

## 2023-06-22 VITALS — BP 126/80 | HR 88 | Wt 189.6 lb

## 2023-06-22 DIAGNOSIS — Z9889 Other specified postprocedural states: Secondary | ICD-10-CM

## 2023-06-22 DIAGNOSIS — G8918 Other acute postprocedural pain: Secondary | ICD-10-CM

## 2023-06-22 MED ORDER — TRAMADOL HCL 50 MG PO TABS: ORAL_TABLET | ORAL | 0 refills | Status: DC

## 2023-06-22 NOTE — Progress Notes (Signed)
GYNECOLOGY  VISIT  CC:   post op recheck  HPI: 37 y.o. [id number] Divorced Black or [place] American female here for recheck after undergoing TLH/bilateral salpingectomy/cystoscopy on 10/23.  She reports bleeding is none.  She has had the most pain prior to bowel movements on Monday and Tuesday when she had loose stools.  There was a lot of cramping when that occurred.   Bowel function is Normal.  Bladder function is normal.    Pathology reviewed:  Yes .  Questions answered.    MEDS:   Current Outpatient Medications on File Prior to Visit  Medication Sig Dispense Refill   amLODipine (NORVASC) 5 MG tablet Take 1 tablet (5 mg total) by mouth daily. 90 tablet 1   Blood Pressure Monitoring (BLOOD PRESSURE CUFF) MISC 1 Units by Does not apply route daily. Upper arm cuff 1 each 0   calcium carbonate (TUMS - DOSED IN MG ELEMENTAL CALCIUM) 500 MG chewable tablet Chew 1-2 tablets by mouth 2 (two) times daily as needed.     Erenumab-aooe (AIMOVIG) 140 MG/ML SOAJ Inject 140 mg into the skin every 28 (twenty-eight) days. 1.12 mL 11   hydrOXYzine (ATARAX) 25 MG tablet TAKE 1 TABLET BY MOUTH 3 TIMES DAILY AS NEEDED FOR ANXIETY 30 tablet 3   ibuprofen (ADVIL) 800 MG tablet Take 1 tablet (800 mg total) by mouth every 8 (eight) hours as needed. 30 tablet 0   ondansetron (ZOFRAN-ODT) 4 MG disintegrating tablet Take 1 tablet (4 mg total) by mouth every 8 (eight) hours as needed for nausea or vomiting. 20 tablet 0   SUMAtriptan (IMITREX) 100 MG tablet Take 1 tablet at earliest onset of migraine.  May repeat in 2 hours if headache persists or recurs.  Maximum 2 tablets in 24 hours 10 tablet 5   UBRELVY 100 MG TABS TAKE 1 TABLET BY MOUTH AS NEEDED. Take 1 TABLET AT THE earliest ONSET OF A migraine. May repeat in 2 hours. Max 2 ts in 24 hours 16 tablet 5   albuterol (VENTOLIN HFA) 108 (90 Base) MCG/ACT inhaler INHALE 1 TO 2 PUFFS INTO THE LUNGS EVERY 6 HOURS AS NEEDED FOR WHEEZING OR SHORTNESS OF BREATH Strength: 108 (90  Base) MCG/ACT (Patient not taking: Reported on 05/31/2023) 8 g 1   No current facility-administered medications on file prior to visit.    SH:  Smoking No    PHYSICAL EXAMINATION:    BP 126/80 (BP Location: Left Arm, Patient Position: Sitting, Cuff Size: Large)   Pulse 88   Wt 189 lb 9.6 oz (86 kg)   BMI 34.68 kg/m     General appearance: alert, cooperative and appears stated age CV:  Regular rate and rhythm Lungs:  clear to auscultation, no wheezes, rales or rhonchi, symmetric air entry Abdomen: soft, non-tender; bowel sounds normal; no masses,  no organomegaly Incisions:  C/D/I   Assessment/Plan: 1. Post-operative state - doing well.  Recheck 3 weeks  2. Post-op pain - will change motrin/percocet to tramadol to see if this will decreased her headaches

## 2023-07-10 ENCOUNTER — Other Ambulatory Visit: Payer: Self-pay | Admitting: Family Medicine

## 2023-07-10 DIAGNOSIS — F410 Panic disorder [episodic paroxysmal anxiety] without agoraphobia: Secondary | ICD-10-CM

## 2023-07-13 ENCOUNTER — Encounter (HOSPITAL_BASED_OUTPATIENT_CLINIC_OR_DEPARTMENT_OTHER): Payer: Self-pay | Admitting: Obstetrics & Gynecology

## 2023-07-14 ENCOUNTER — Ambulatory Visit (HOSPITAL_BASED_OUTPATIENT_CLINIC_OR_DEPARTMENT_OTHER): Payer: Medicaid Other | Admitting: Obstetrics & Gynecology

## 2023-07-14 ENCOUNTER — Encounter (HOSPITAL_BASED_OUTPATIENT_CLINIC_OR_DEPARTMENT_OTHER): Payer: Self-pay | Admitting: Obstetrics & Gynecology

## 2023-07-14 VITALS — BP 127/98 | HR 81 | Ht 62.0 in | Wt 193.2 lb

## 2023-07-14 DIAGNOSIS — Z9889 Other specified postprocedural states: Secondary | ICD-10-CM

## 2023-07-14 NOTE — Progress Notes (Signed)
GYNECOLOGY  VISIT  CC:   post op recheck  HPI: 37 y.o. [id number] Divorced Black or [place] American female here for recheck after undergoing TLH/bilateral salpingectomy/cystoscopy on 10/31.  She reports bleeding is none.  She has much improved pain.  Hasn't taken any pain medications in more than a week now.  Bowel function is  improving.  She has had some post op constipation.  Has had to use a little miralax at times .  Bladder function is normal.    Pathology reviewed:  Yes .  Questions answered.    MEDS:   Current Outpatient Medications on File Prior to Visit  Medication Sig Dispense Refill   amLODipine (NORVASC) 5 MG tablet Take 1 tablet (5 mg total) by mouth daily. 90 tablet 1   Blood Pressure Monitoring (BLOOD PRESSURE CUFF) MISC 1 Units by Does not apply route daily. Upper arm cuff 1 each 0   calcium carbonate (TUMS - DOSED IN MG ELEMENTAL CALCIUM) 500 MG chewable tablet Chew 1-2 tablets by mouth 2 (two) times daily as needed.     Erenumab-aooe (AIMOVIG) 140 MG/ML SOAJ Inject 140 mg into the skin every 28 (twenty-eight) days. 1.12 mL 11   hydrOXYzine (ATARAX) 25 MG tablet TAKE 1 TABLET BY MOUTH 3 TIMES DAILY AS NEEDED FOR ANXIETY 30 tablet 3   ondansetron (ZOFRAN-ODT) 4 MG disintegrating tablet Take 1 tablet (4 mg total) by mouth every 8 (eight) hours as needed for nausea or vomiting. 20 tablet 0   SUMAtriptan (IMITREX) 100 MG tablet Take 1 tablet at earliest onset of migraine.  May repeat in 2 hours if headache persists or recurs.  Maximum 2 tablets in 24 hours 10 tablet 5   UBRELVY 100 MG TABS TAKE 1 TABLET BY MOUTH AS NEEDED. Take 1 TABLET AT THE earliest ONSET OF A migraine. May repeat in 2 hours. Max 2 ts in 24 hours 16 tablet 5   albuterol (VENTOLIN HFA) 108 (90 Base) MCG/ACT inhaler INHALE 1 TO 2 PUFFS INTO THE LUNGS EVERY 6 HOURS AS NEEDED FOR WHEEZING OR SHORTNESS OF BREATH Strength: 108 (90 Base) MCG/ACT (Patient not taking: Reported on 05/31/2023) 8 g 1   ibuprofen (ADVIL) 800 MG  tablet Take 1 tablet (800 mg total) by mouth every 8 (eight) hours as needed. (Patient not taking: Reported on 07/14/2023) 30 tablet 0   traMADol (ULTRAM) 50 MG tablet 1-2 tablets every 6 hours as needed. (Patient not taking: Reported on 07/14/2023) 40 tablet 0   No current facility-administered medications on file prior to visit.    SH:  Smoking No    PHYSICAL EXAMINATION:    BP (!) 127/98 (BP Location: Left Arm, Patient Position: Sitting, Cuff Size: Large)   Pulse 81   Ht 5\' 2"  (1.575 m) Comment: Reported  Wt 193 lb 3.2 oz (87.6 kg)   BMI 35.34 kg/m     General appearance: alert, cooperative and appears stated age CV:  Regular rate and rhythm Lungs:  clear to auscultation, no wheezes, rales or rhonchi, symmetric air entry Abdomen: soft, non-tender; bowel sounds normal; no masses,  no organomegaly Incisions:  C/D/I  Pelvic: External genitalia:  no lesions              Urethra:  normal appearing urethra with no masses, tenderness or lesions              Bartholins and Skenes: normal                 Vagina:  normal appearing vagina with normal color and discharge, no lesions              Cervix: absent              Bimanual Exam:  Uterus:  uterus absent              Adnexa: no mass, fullness, tenderness  Chaperone, Ina Homes, was present for exam.  Assessment/Plan: 1. Post-operative state - she is doing well.  Restrictions discussed including pelvic rest for full 12 weeks.  This would end 09/06/2023.  Otherwise, she can begin to resume all normal activity.

## 2023-08-01 ENCOUNTER — Ambulatory Visit: Payer: Medicaid Other | Admitting: Student

## 2023-08-01 ENCOUNTER — Telehealth: Payer: Self-pay

## 2023-08-01 NOTE — Telephone Encounter (Signed)
Patient came to nurse clinic today for PPD placement. Unfortunately, PPD solution was expired, unable to administer testing.   We also did not have phlebotomist from [facility] present to draw possible Quant Gold.   Patient had immunity titer screening and Quant Gold on 12/02/22.  She is needing paperwork completed for [facility].   Advised that screening is typically valid for one year.   Informed patient that I completed portion of form with dates from April 2024. She will let us know if this does not meet employer requirements.   Form requires provider signature. Placed form in provider box for completion.   Please return to RN team once complete.   Veronda Prude, RN

## 2023-08-03 ENCOUNTER — Ambulatory Visit: Payer: Medicaid Other

## 2023-08-03 NOTE — Telephone Encounter (Signed)
Form placed up front for pick up.   Copy made for batch scanning.   Patient aware.  

## 2023-09-22 ENCOUNTER — Other Ambulatory Visit: Payer: Self-pay | Admitting: Neurology

## 2023-09-22 DIAGNOSIS — R519 Headache, unspecified: Secondary | ICD-10-CM

## 2023-10-16 ENCOUNTER — Other Ambulatory Visit (HOSPITAL_BASED_OUTPATIENT_CLINIC_OR_DEPARTMENT_OTHER): Payer: Self-pay | Admitting: Obstetrics & Gynecology

## 2023-10-16 DIAGNOSIS — N946 Dysmenorrhea, unspecified: Secondary | ICD-10-CM

## 2023-10-16 DIAGNOSIS — R519 Headache, unspecified: Secondary | ICD-10-CM

## 2023-10-31 ENCOUNTER — Other Ambulatory Visit (HOSPITAL_COMMUNITY): Payer: Self-pay

## 2023-11-02 ENCOUNTER — Ambulatory Visit (HOSPITAL_BASED_OUTPATIENT_CLINIC_OR_DEPARTMENT_OTHER): Admitting: Obstetrics & Gynecology

## 2023-11-09 ENCOUNTER — Ambulatory Visit (HOSPITAL_BASED_OUTPATIENT_CLINIC_OR_DEPARTMENT_OTHER): Admitting: Obstetrics & Gynecology

## 2023-11-16 ENCOUNTER — Other Ambulatory Visit (HOSPITAL_COMMUNITY)
Admission: RE | Admit: 2023-11-16 | Discharge: 2023-11-16 | Disposition: A | Discharge status: Home / Self Care | Source: Ambulatory Visit | Attending: Obstetrics & Gynecology | Admitting: Obstetrics & Gynecology

## 2023-11-16 ENCOUNTER — Ambulatory Visit (HOSPITAL_BASED_OUTPATIENT_CLINIC_OR_DEPARTMENT_OTHER): Admitting: Obstetrics & Gynecology

## 2023-11-16 ENCOUNTER — Encounter (HOSPITAL_BASED_OUTPATIENT_CLINIC_OR_DEPARTMENT_OTHER): Payer: Self-pay | Admitting: Obstetrics & Gynecology

## 2023-11-16 VITALS — BP 132/72 | HR 87 | Ht 62.0 in | Wt 191.0 lb

## 2023-11-16 DIAGNOSIS — N946 Dysmenorrhea, unspecified: Secondary | ICD-10-CM

## 2023-11-16 DIAGNOSIS — R110 Nausea: Secondary | ICD-10-CM

## 2023-11-16 DIAGNOSIS — R5383 Other fatigue: Secondary | ICD-10-CM

## 2023-11-16 DIAGNOSIS — R519 Headache, unspecified: Secondary | ICD-10-CM

## 2023-11-16 DIAGNOSIS — N898 Other specified noninflammatory disorders of vagina: Secondary | ICD-10-CM

## 2023-11-16 DIAGNOSIS — M6289 Other specified disorders of muscle: Secondary | ICD-10-CM | POA: Diagnosis not present

## 2023-11-16 MED ORDER — METRONIDAZOLE 500 MG PO TABS: 500.0000 mg | ORAL_TABLET | Freq: Two times a day (BID) | ORAL | 0 refills | Status: DC

## 2023-11-16 MED ORDER — GABAPENTIN 100 MG PO CAPS: 100.0000 mg | ORAL_CAPSULE | Freq: Every day | ORAL | 0 refills | Status: DC

## 2023-11-16 MED ORDER — ONDANSETRON 4 MG PO TBDP: 4.0000 mg | ORAL_TABLET | Freq: Three times a day (TID) | ORAL | 0 refills | Status: DC | PRN

## 2023-11-16 NOTE — Progress Notes (Signed)
 GYNECOLOGY  VISIT  CC:   pelvic pain, nausea  HPI: 38 y.o. [id number] Divorced Black or [place] American female here for discussion of some symptoms that have continued after her hysterectomy.  TLH/bilateral salpingectomy/cystoscopy was done 06/14/2023.  She's not had any vaginal bleeding and is very happy with this.  Having some lower pelvic pain, R>L.  Using gabapentin with success.  Needs RF.  Not using narcotics.   She is having some continued fatigue as well.  She thought this was possibly due to anemia prior to hysterectomy.  Will update lab work today including iron, ferrin and CBC as well as TSH.  Pt is having some persistent nausea.  Using zofran.  Needs RF.  Declines seeing Gi at this time.  She may ultimately need GI consult.   Past Medical History:  Diagnosis Date   Anemia    Follows w/ PCP, Dr. Janit Pagan @ [facility].   Anxiety    Asthma    mild, inhaler prn   DUB (dysfunctional uterine bleeding) 2023   Headache    chronic migraines, follows with Shon Millet, MD, neurology   HSV (herpes simplex virus) infection    Hx of chlamydia infection    Hx of gonorrhea     MEDS:   Current Outpatient Medications on File Prior to Visit  Medication Sig Dispense Refill   albuterol (VENTOLIN HFA) 108 (90 Base) MCG/ACT inhaler INHALE 1 TO 2 PUFFS INTO THE LUNGS EVERY 6 HOURS AS NEEDED FOR WHEEZING OR SHORTNESS OF BREATH Strength: 108 (90 Base) MCG/ACT 8 g 1   amLODipine (NORVASC) 5 MG tablet Take 1 tablet (5 mg total) by mouth daily. 90 tablet 1   Blood Pressure Monitoring (BLOOD PRESSURE CUFF) MISC 1 Units by Does not apply route daily. Upper arm cuff 1 each 0   calcium carbonate (TUMS - DOSED IN MG ELEMENTAL CALCIUM) 500 MG chewable tablet Chew 1-2 tablets by mouth 2 (two) times daily as needed.     Erenumab-aooe (AIMOVIG) 140 MG/ML SOAJ Inject 140 mg into the skin every 28 (twenty-eight) days. 1.12 mL 11   hydrOXYzine (ATARAX) 25 MG tablet TAKE 1 TABLET BY MOUTH 3  TIMES DAILY AS NEEDED FOR ANXIETY 30 tablet 3   SUMAtriptan (IMITREX) 100 MG tablet Take 1 tablet at earliest onset of migraine.  May repeat in 2 hours if headache persists or recurs.  Maximum 2 tablets in 24 hours 10 tablet 5   UBRELVY 100 MG TABS TAKE 1 TABLET BY MOUTH AS NEEDED. Take 1 TABLET AT THE earliest ONSET OF A migraine. May repeat in 2 hours. Max 2 ts in 24 hours 16 tablet 5   No current facility-administered medications on file prior to visit.    ALLERGIES: Coconut flavoring agent (non-screening), Other, and Tomato  SH:  divorced, non smoker  ROS  PHYSICAL EXAMINATION:    BP 132/72 (BP Location: Right Arm, Patient Position: Sitting, Cuff Size: Normal)   Pulse 87   Ht 5\' 2"  (1.575 m)   Wt 191 lb (86.6 kg)   BMI 34.93 kg/m     General appearance: alert, cooperative and appears stated age  Lymph:  no inguinal LAD noted Pelvic: External genitalia:  no lesions              Urethra:  normal appearing urethra with no masses, tenderness or lesions              Bartholins and Skenes: normal  Vagina: normal mucosa without prolapse or lesions and discharge, whitish with some odor present              Cervix: absent              Bimanual Exam:  Uterus:  uterus absent              Adnexa: no mass, fullness, tenderness  Chaperone, Hendricks Milo, CMA, was present for exam.  Assessment/Plan: 1. Pelvic floor dysfunction (Primary) - will refer to PT and continue gabapentin for now - gabapentin (NEURONTIN) 100 MG capsule; Take 1 capsule (100 mg total) by mouth at bedtime. TAKE 1 CAPSULE BY MOUTH EVERY 8 HOURS AS NEEDED FOR PAIN  Dispense: 90 capsule; Refill: 0 - Ambulatory referral to Physical Therapy  2. Other fatigue - Iron, TIBC and Ferritin Panel - TSH - CBC - VITAMIN D 25 Hydroxy (Vit-D Deficiency, Fractures)  3. Vaginal odor - pt does desire treatment today if possible - Cervicovaginal ancillary only( [place]) - metroNIDAZOLE (FLAGYL) 500 MG tablet;  Take 1 tablet (500 mg total) by mouth 2 (two) times daily.  Dispense: 14 tablet; Refill: 0  4. Nausea - ondansetron (ZOFRAN-ODT) 4 MG disintegrating tablet; Take 1 tablet (4 mg total) by mouth every 8 (eight) hours as needed for nausea or vomiting.  Dispense: 30 tablet; Refill: 0

## 2023-11-17 LAB — CERVICOVAGINAL ANCILLARY ONLY
Bacterial Vaginitis (gardnerella): NEGATIVE
Candida Glabrata: NEGATIVE
Candida Vaginitis: NEGATIVE
Chlamydia: NEGATIVE
Comment: NEGATIVE
Comment: NEGATIVE
Comment: NEGATIVE
Comment: NEGATIVE
Comment: NEGATIVE
Comment: NORMAL
Neisseria Gonorrhea: NEGATIVE
Trichomonas: NEGATIVE

## 2023-11-17 LAB — CBC
Hematocrit: 40.5 % (ref 34.0–46.6)
Hemoglobin: 13.4 g/dL (ref 11.1–15.9)
MCH: 28.8 pg (ref 26.6–33.0)
MCHC: 33.1 g/dL (ref 31.5–35.7)
MCV: 87 fL (ref 79–97)
Platelets: 325 10*3/uL (ref 150–450)
RBC: 4.65 x10E6/uL (ref 3.77–5.28)
RDW: 12.6 % (ref 11.7–15.4)
WBC: 5.3 10*3/uL (ref 3.4–10.8)

## 2023-11-17 LAB — VITAMIN D 25 HYDROXY (VIT D DEFICIENCY, FRACTURES): Vit D, 25-Hydroxy: 16 ng/mL — ABNORMAL LOW (ref 30.0–100.0)

## 2023-11-17 LAB — IRON,TIBC AND FERRITIN PANEL
Ferritin: 37 ng/mL (ref 15–150)
Iron Saturation: 26 % (ref 15–55)
Iron: 91 ug/dL (ref 27–159)
Total Iron Binding Capacity: 345 ug/dL (ref 250–450)
UIBC: 254 ug/dL (ref 131–425)

## 2023-11-17 LAB — TSH: TSH: 0.33 u[IU]/mL — ABNORMAL LOW (ref 0.450–4.500)

## 2023-11-19 ENCOUNTER — Encounter (HOSPITAL_BASED_OUTPATIENT_CLINIC_OR_DEPARTMENT_OTHER): Payer: Self-pay | Admitting: Obstetrics & Gynecology
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
## 2023-11-23 ENCOUNTER — Other Ambulatory Visit: Payer: Self-pay

## 2023-11-23 ENCOUNTER — Encounter: Payer: Self-pay | Admitting: Physical Therapy

## 2023-11-23 ENCOUNTER — Ambulatory Visit: Attending: Obstetrics & Gynecology | Admitting: Physical Therapy

## 2023-11-23 DIAGNOSIS — M6289 Other specified disorders of muscle: Secondary | ICD-10-CM | POA: Diagnosis not present

## 2023-11-23 DIAGNOSIS — M6281 Muscle weakness (generalized): Secondary | ICD-10-CM | POA: Diagnosis not present

## 2023-11-23 DIAGNOSIS — M62838 Other muscle spasm: Secondary | ICD-10-CM | POA: Diagnosis not present

## 2023-11-23 NOTE — Telephone Encounter (Signed)
 PA status

## 2023-11-28 ENCOUNTER — Telehealth: Payer: Self-pay | Admitting: Pharmacy Technician

## 2023-11-28 NOTE — Telephone Encounter (Signed)
 PA has been submitted, and telephone encounter has been created. Please see telephone encounter dated 4.8.25.

## 2023-11-28 NOTE — Telephone Encounter (Signed)
 Pharmacy Patient Advocate Encounter   Received notification from Pt Calls Messages that prior authorization for UBRELVY 100MG  is required/requested.   Insurance verification completed.   The patient is insured through [facility] .   Per test claim: PA required; PA submitted to above mentioned insurance via CoverMyMeds Key/confirmation #/EOC [id number] Status is pending

## 2023-11-29 ENCOUNTER — Other Ambulatory Visit (HOSPITAL_COMMUNITY): Payer: Self-pay

## 2023-11-29 NOTE — Telephone Encounter (Signed)
 Pharmacy Patient Advocate Encounter  Received notification from [facility] that Prior Authorization for Ubrelvy 100MG  tablets has been APPROVED from 11/28/2023 to 11/27/2024. Ran test claim, Copay is $4.00. This test claim was processed through [facility]- copay amounts may vary at other pharmacies due to pharmacy/plan contracts, or as the patient moves through the different stages of their insurance plan.

## 2023-11-30 ENCOUNTER — Ambulatory Visit: Admitting: Physical Therapy

## 2023-12-01 ENCOUNTER — Other Ambulatory Visit (HOSPITAL_BASED_OUTPATIENT_CLINIC_OR_DEPARTMENT_OTHER): Payer: Self-pay | Admitting: *Deleted

## 2023-12-01 MED ORDER — VITAMIN D (ERGOCALCIFEROL) 1.25 MG (50000 UNIT) PO CAPS: 50000.0000 [IU] | ORAL_CAPSULE | ORAL | 0 refills | Status: DC

## 2023-12-07 ENCOUNTER — Ambulatory Visit: Admitting: Physical Therapy

## 2023-12-12 ENCOUNTER — Ambulatory Visit: Admitting: Physical Therapy

## 2023-12-17 IMAGING — US US PELVIS COMPLETE
1 series · 15 of 25 positions shown · non-contrast
Comparison: Pelvic ultrasound dated 06/05/2014.

CLINICAL DATA: Severe pelvic pain and bleeding. The patient has an
intrauterine contraceptive device (IUD).

EXAM:
TRANSABDOMINAL AND TRANSVAGINAL ULTRASOUND OF PELVIS
DOPPLER ULTRASOUND OF OVARIES
TECHNIQUE: Both transabdominal and transvaginal ultrasound examinations of the
pelvis were performed. Transabdominal technique was performed for
global imaging of the pelvis including uterus, ovaries, adnexal
regions, and pelvic cul-de-sac.
It was necessary to proceed with endovaginal exam following the
transabdominal exam to visualize the endometrium and adnexa. Color
and duplex Doppler ultrasound was utilized to evaluate blood flow to
the ovaries.

[Series 1: us pelvis complete mc & wl · 95 acquisitions, 15 frames shown]
[im 1/95]
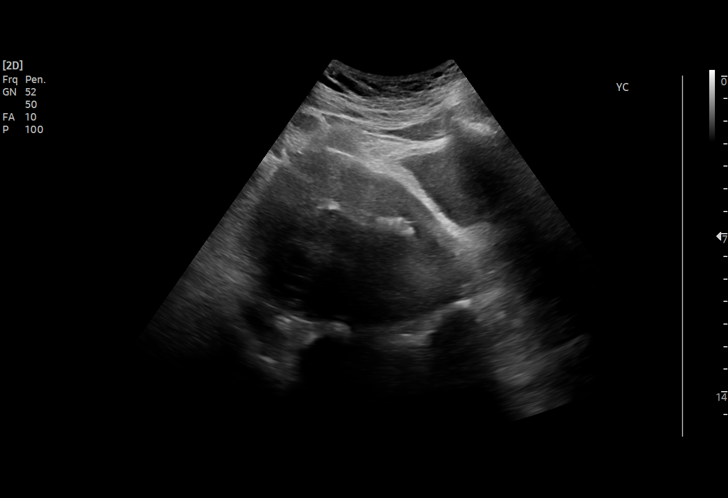
[im 8/95]
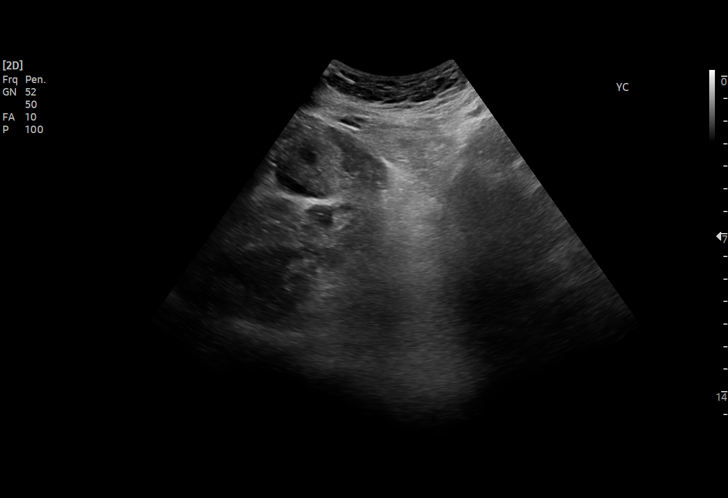
[im 16/95]
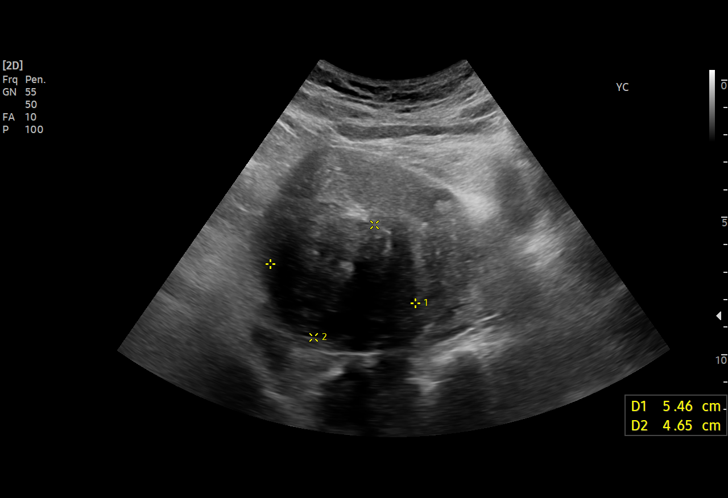
[im 20/95]
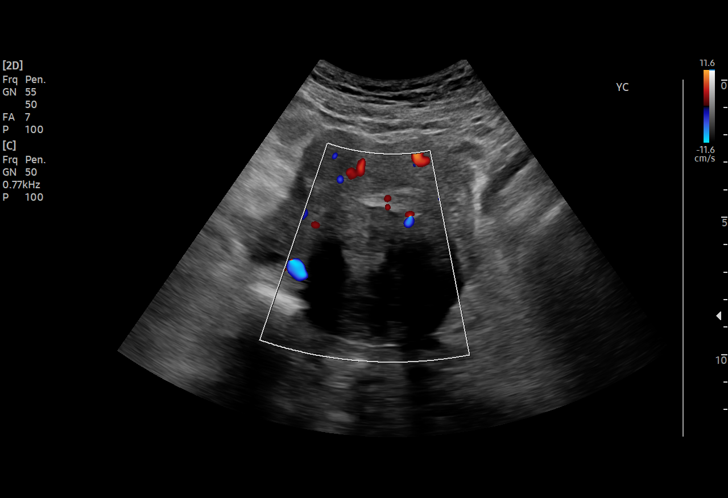
[im 28/95]
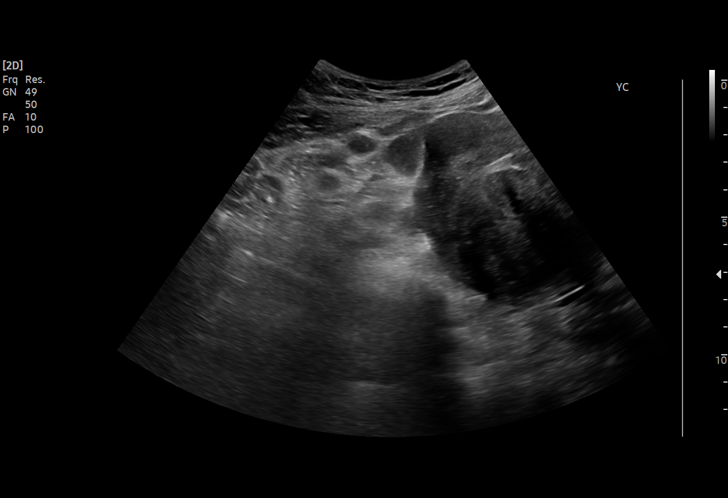
[im 36/95]
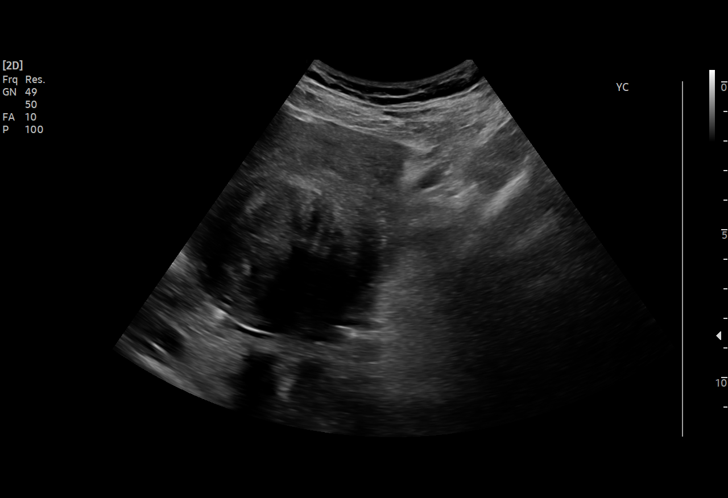
[im 40/95]
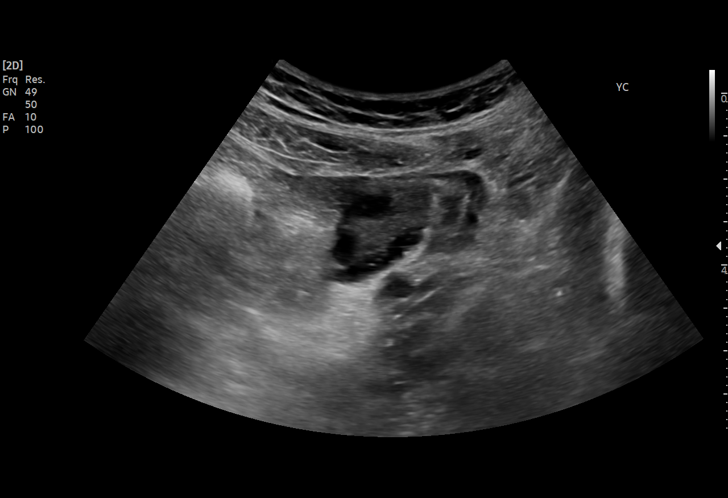
[im 48/95]
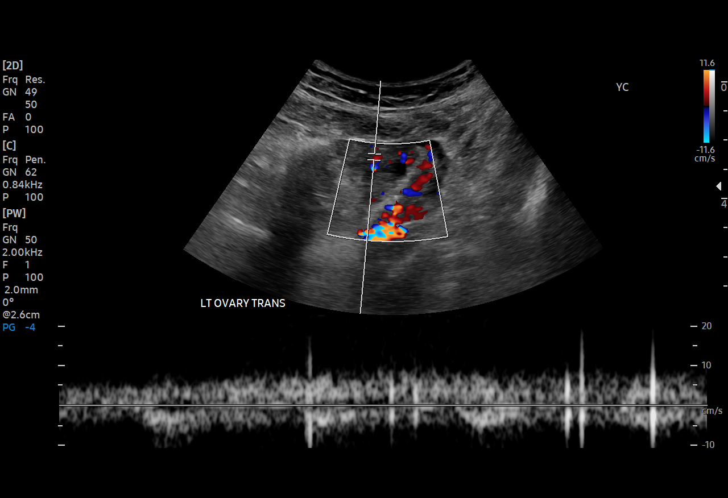
[im 55/95]
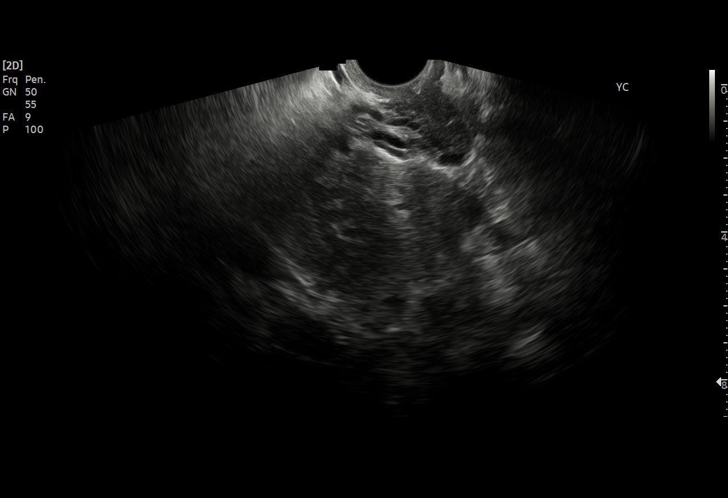
[im 59/95]
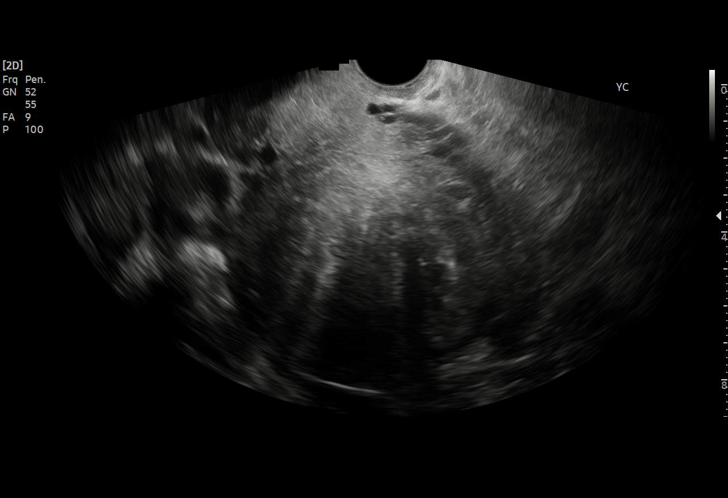
[im 67/95]
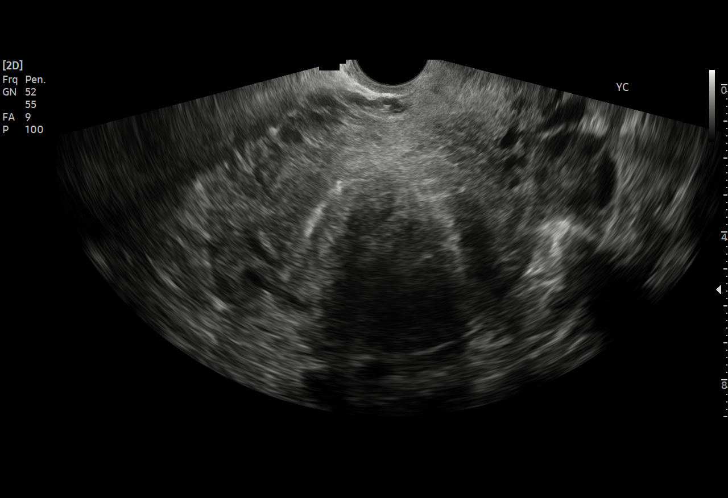
[im 75/95]
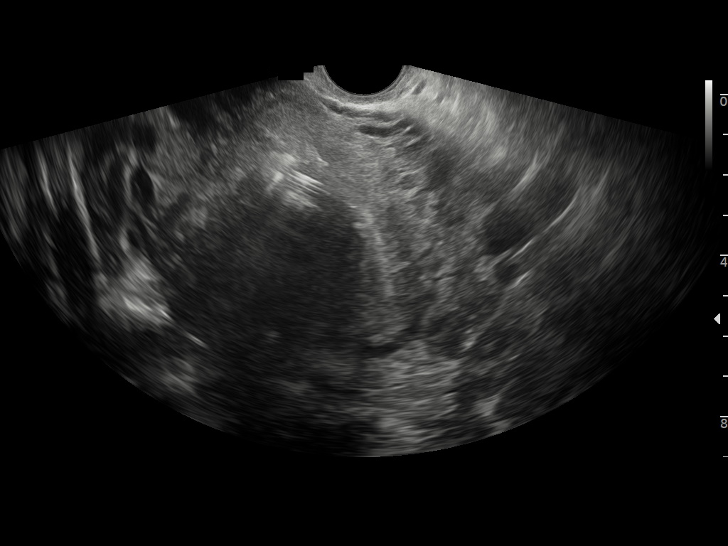
[im 79/95]
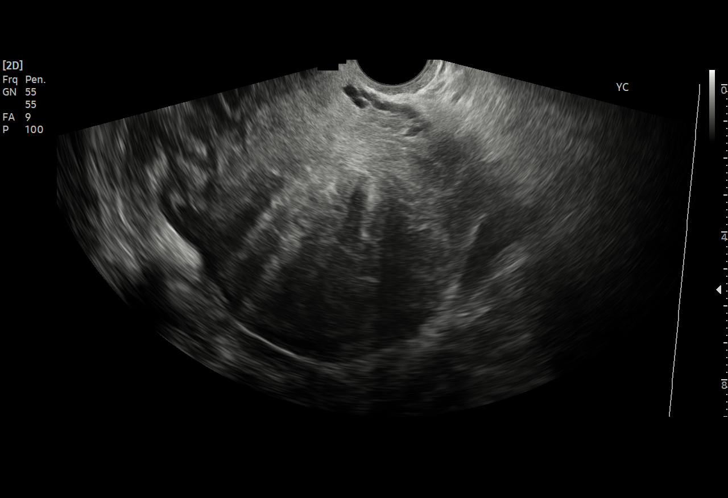
[im 87/95]
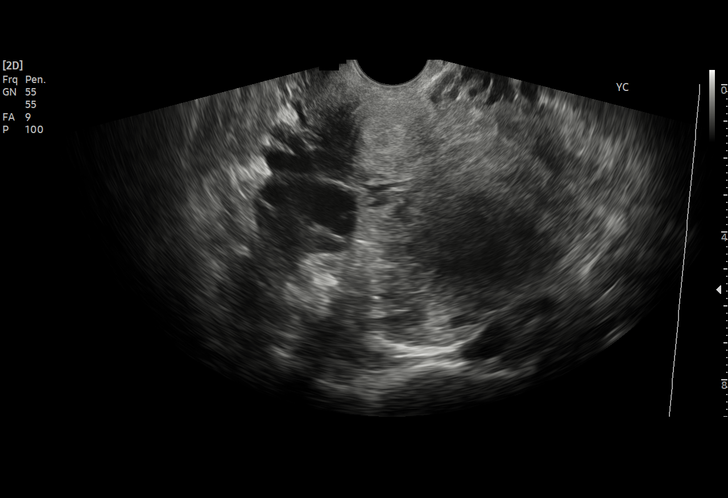
[im 95/95]
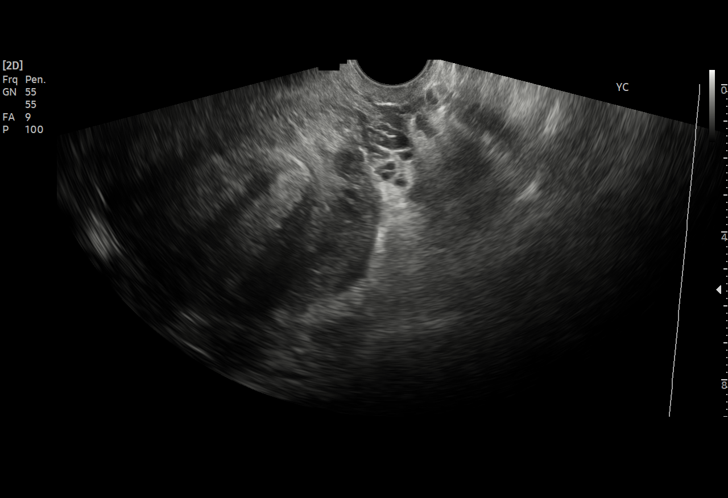

[15 of 25 positions shown; findings below may reference images not displayed]

FINDINGS: Uterus

Measurements: 12.0 x 7.2 x 7.7 cm = volume: 12 mL. A mass in the
posterior aspect of the uterine body measures 5.5 x 4.7 x 5.9 cm. A
mass in the uterine fundus measures 2.6 x 1.9 x 2.4 cm. These likely
represent fibroadenomas.

Endometrium

Thickness: 8 mm. No focal abnormality visualized, however
visualization is limited due to the patient has IUD.

Right ovary

Measurements: 4.7 x 2.7 x 2.8 cm = volume: 18.5 mL. Normal
appearance/no adnexal mass.

Left ovary

Measurements: 4.3 x 2.0 x 2.1 cm = volume: 9.6 mL. Normal
appearance/no adnexal mass.

Pulsed Doppler evaluation of both ovaries demonstrates normal
low-resistance arterial and venous waveforms.

Other findings

No abnormal free fluid.
IMPRESSION: 1. Masses in the uterus likely represent fibroadenomas.
2. No abnormality of the endometrium, however visualization is
limited due to the presence of an IUD.

## 2023-12-17 IMAGING — US US ART/VEN ABD/PELV/SCROTUM DOPPLER LTD
1 series · 15 of 25 positions shown · non-contrast
Comparison: Pelvic ultrasound dated 06/05/2014.

CLINICAL DATA: Severe pelvic pain and bleeding. The patient has an
intrauterine contraceptive device (IUD).

EXAM:
TRANSABDOMINAL AND TRANSVAGINAL ULTRASOUND OF PELVIS
DOPPLER ULTRASOUND OF OVARIES
TECHNIQUE: Both transabdominal and transvaginal ultrasound examinations of the
pelvis were performed. Transabdominal technique was performed for
global imaging of the pelvis including uterus, ovaries, adnexal
regions, and pelvic cul-de-sac.
It was necessary to proceed with endovaginal exam following the
transabdominal exam to visualize the endometrium and adnexa. Color
and duplex Doppler ultrasound was utilized to evaluate blood flow to
the ovaries.

[Series 1: us pelvis complete mc & wl · 95 acquisitions, 15 frames shown]
[im 1/95]
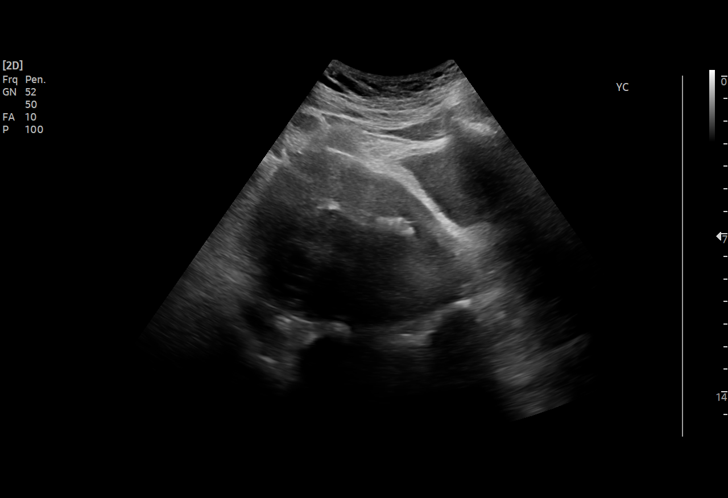
[im 8/95]
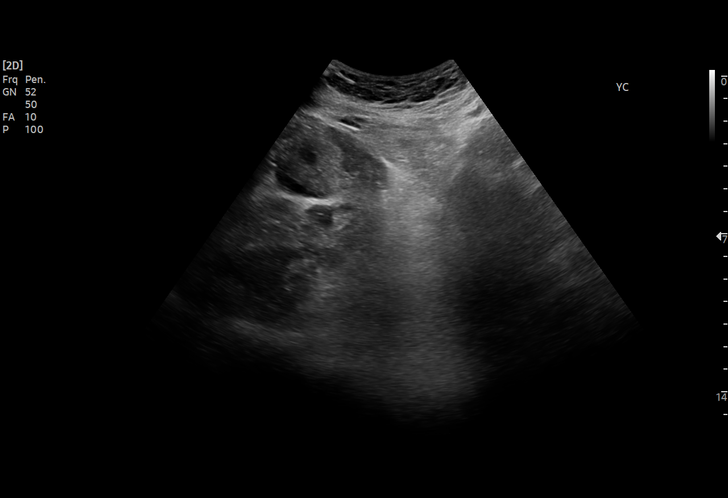
[im 16/95]
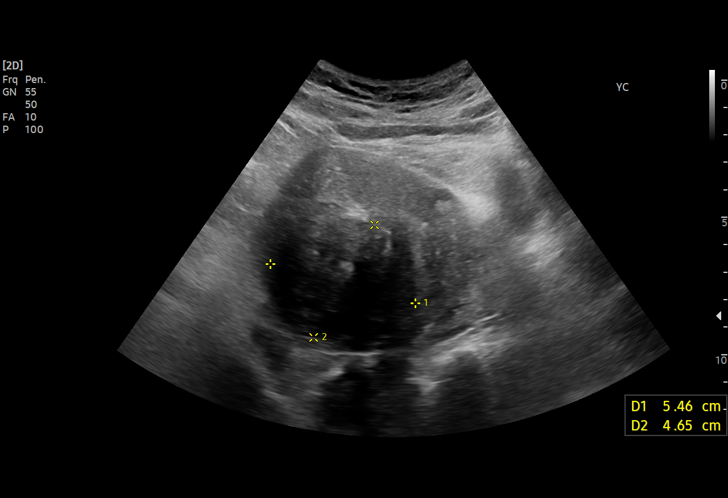
[im 20/95]
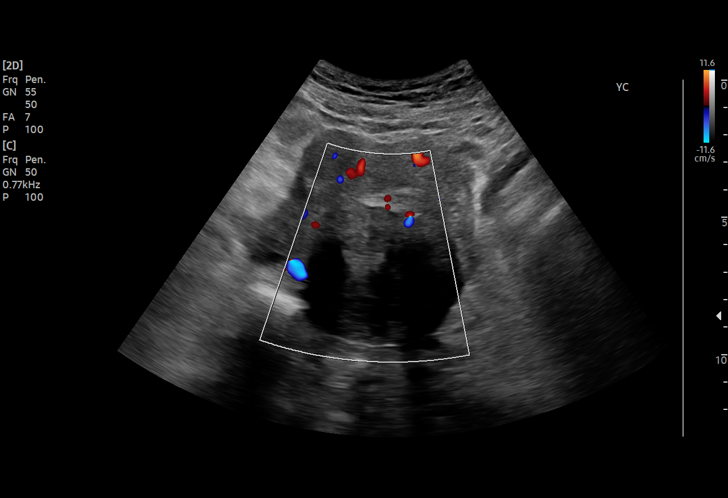
[im 28/95]
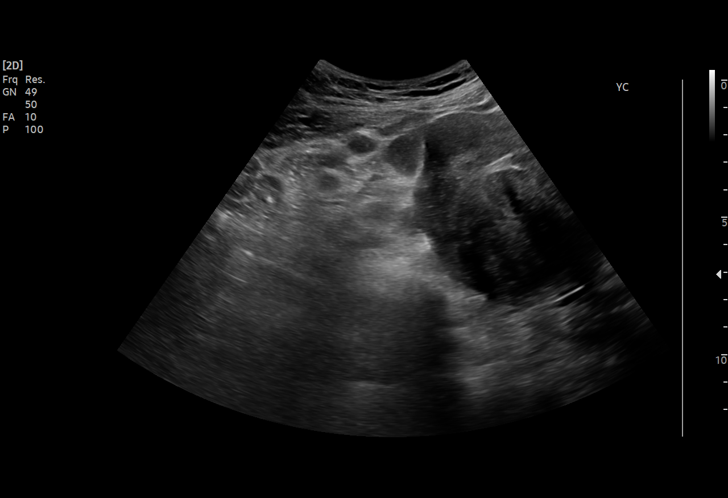
[im 36/95]
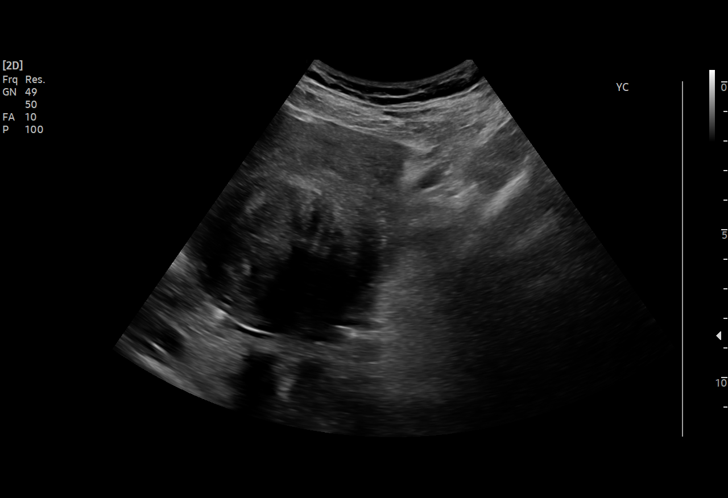
[im 40/95]
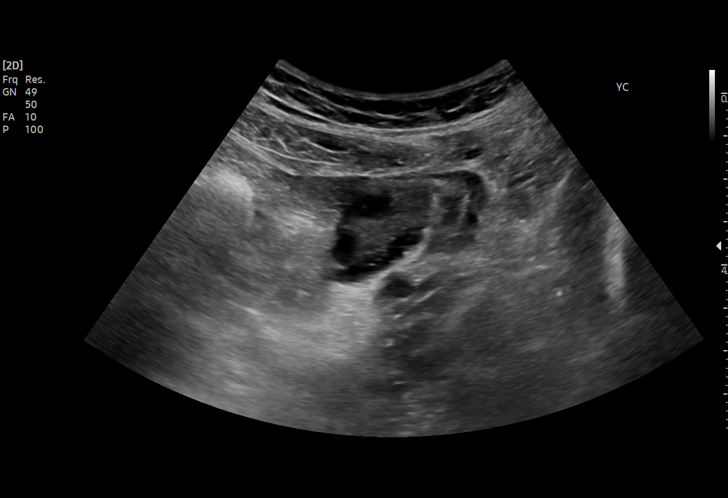
[im 48/95]
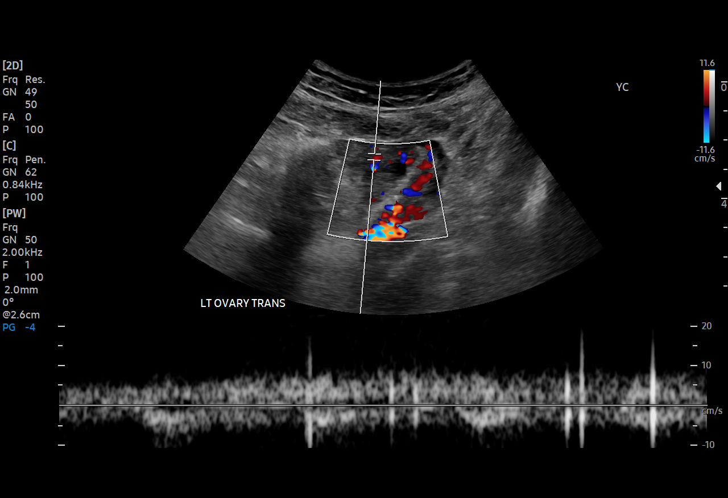
[im 55/95]
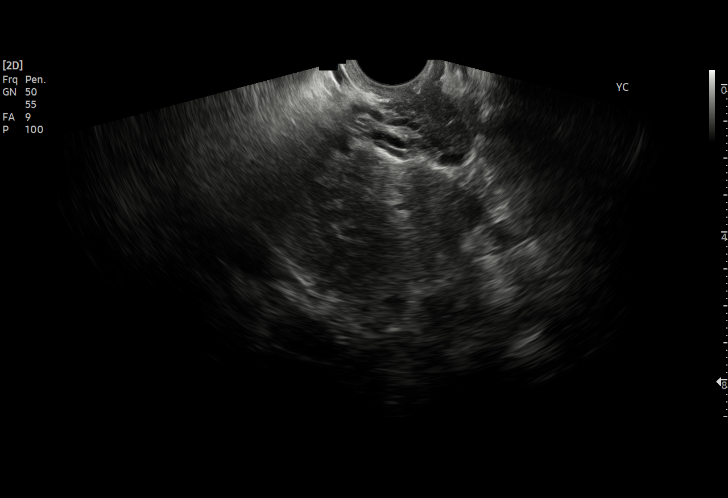
[im 59/95]
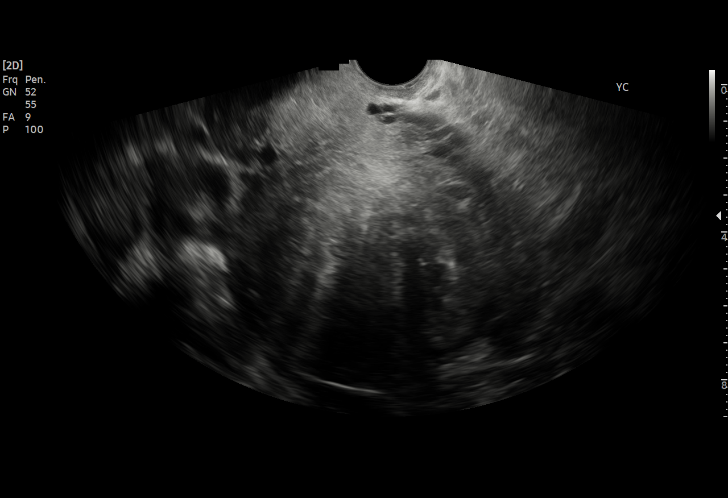
[im 67/95]
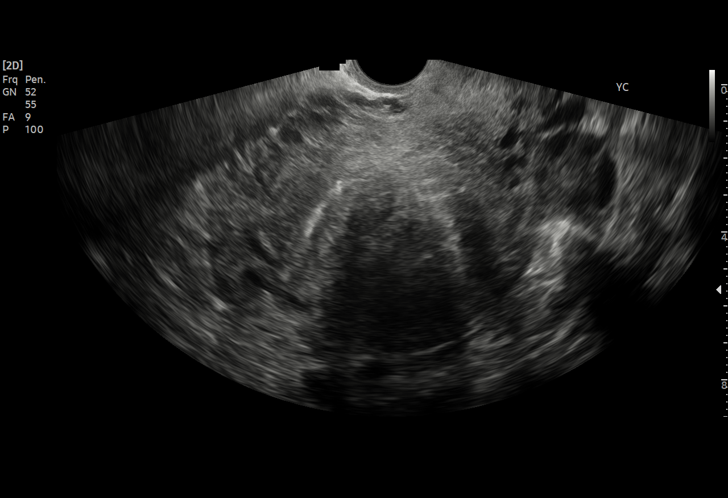
[im 75/95]
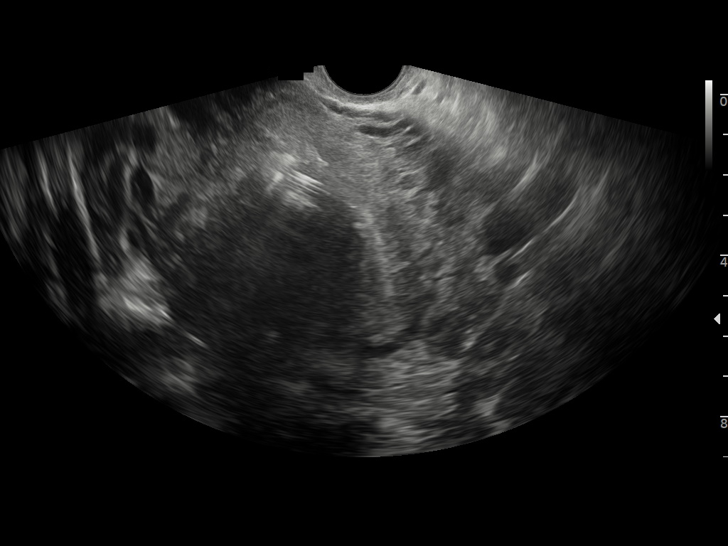
[im 79/95]
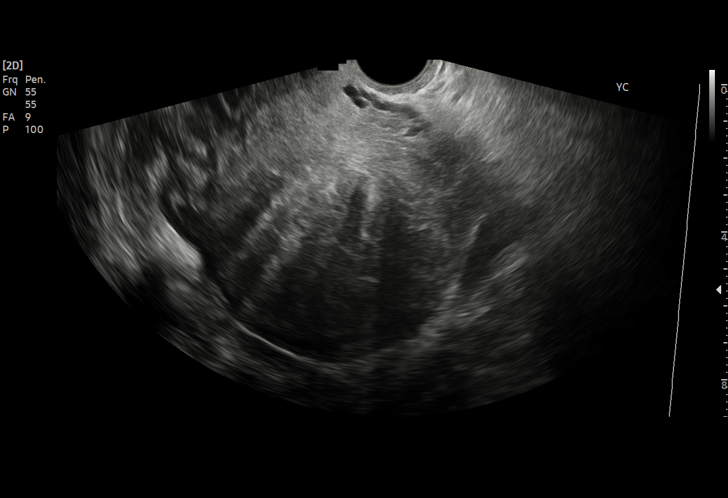
[im 87/95]
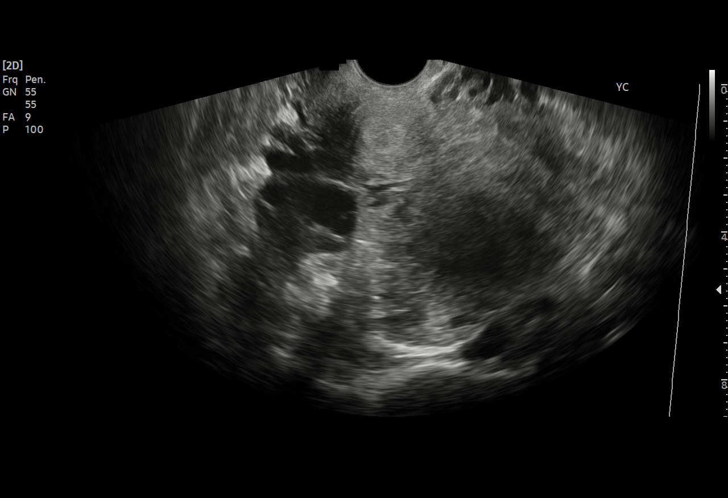
[im 95/95]
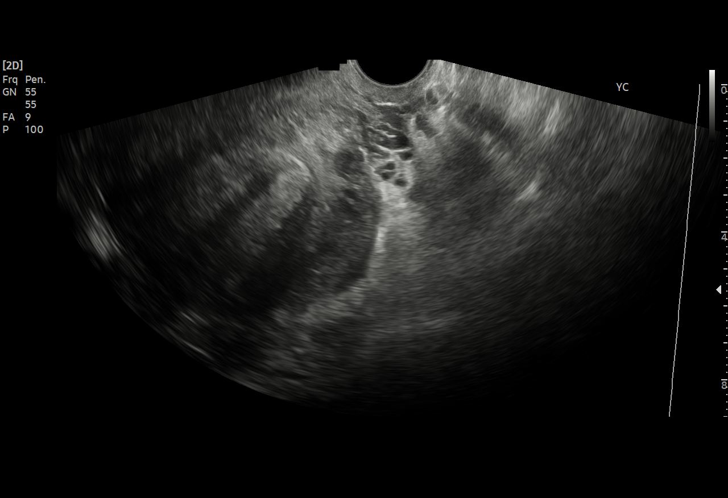

[15 of 25 positions shown; findings below may reference images not displayed]

FINDINGS: Uterus

Measurements: 12.0 x 7.2 x 7.7 cm = volume: 12 mL. A mass in the
posterior aspect of the uterine body measures 5.5 x 4.7 x 5.9 cm. A
mass in the uterine fundus measures 2.6 x 1.9 x 2.4 cm. These likely
represent fibroadenomas.

Endometrium

Thickness: 8 mm. No focal abnormality visualized, however
visualization is limited due to the patient has IUD.

Right ovary

Measurements: 4.7 x 2.7 x 2.8 cm = volume: 18.5 mL. Normal
appearance/no adnexal mass.

Left ovary

Measurements: 4.3 x 2.0 x 2.1 cm = volume: 9.6 mL. Normal
appearance/no adnexal mass.

Pulsed Doppler evaluation of both ovaries demonstrates normal
low-resistance arterial and venous waveforms.

Other findings

No abnormal free fluid.
IMPRESSION: 1. Masses in the uterus likely represent fibroadenomas.
2. No abnormality of the endometrium, however visualization is
limited due to the presence of an IUD.

## 2023-12-17 IMAGING — US US TRANSVAGINAL NON-OB
1 series · 15 of 25 positions shown · non-contrast
Comparison: Pelvic ultrasound dated 06/05/2014.

CLINICAL DATA: Severe pelvic pain and bleeding. The patient has an
intrauterine contraceptive device (IUD).

EXAM:
TRANSABDOMINAL AND TRANSVAGINAL ULTRASOUND OF PELVIS
DOPPLER ULTRASOUND OF OVARIES
TECHNIQUE: Both transabdominal and transvaginal ultrasound examinations of the
pelvis were performed. Transabdominal technique was performed for
global imaging of the pelvis including uterus, ovaries, adnexal
regions, and pelvic cul-de-sac.
It was necessary to proceed with endovaginal exam following the
transabdominal exam to visualize the endometrium and adnexa. Color
and duplex Doppler ultrasound was utilized to evaluate blood flow to
the ovaries.

[Series 1: us pelvis complete mc & wl · 95 acquisitions, 15 frames shown]
[im 1/95]
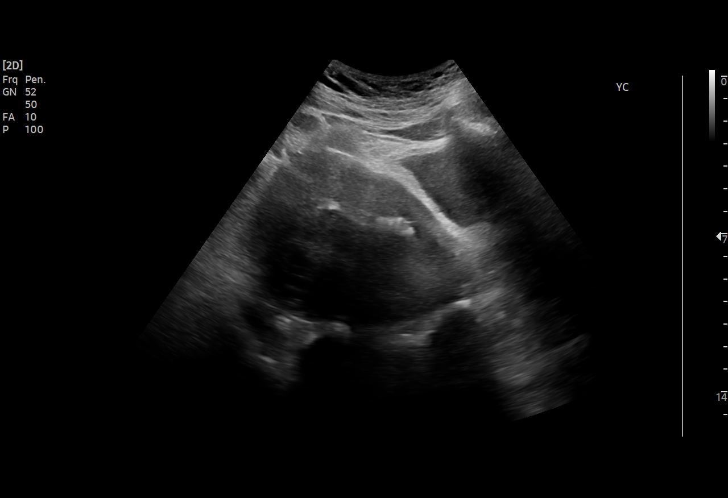
[im 8/95]
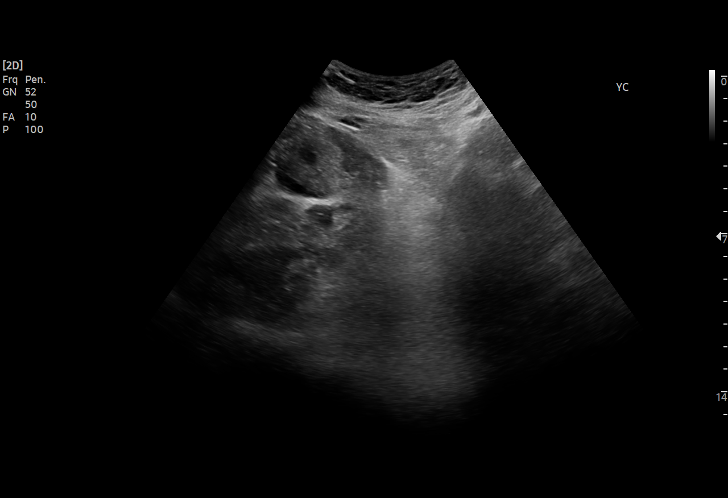
[im 16/95]
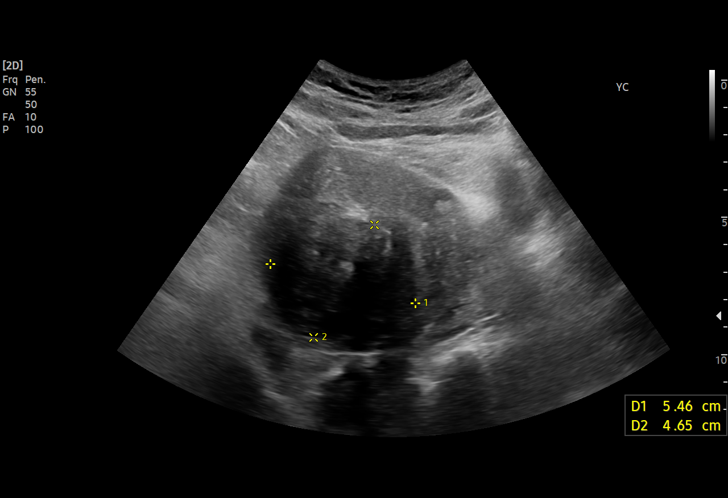
[im 20/95]
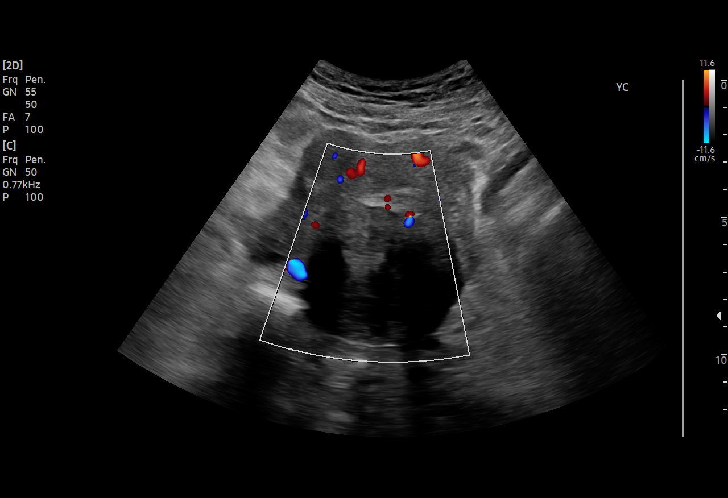
[im 28/95]
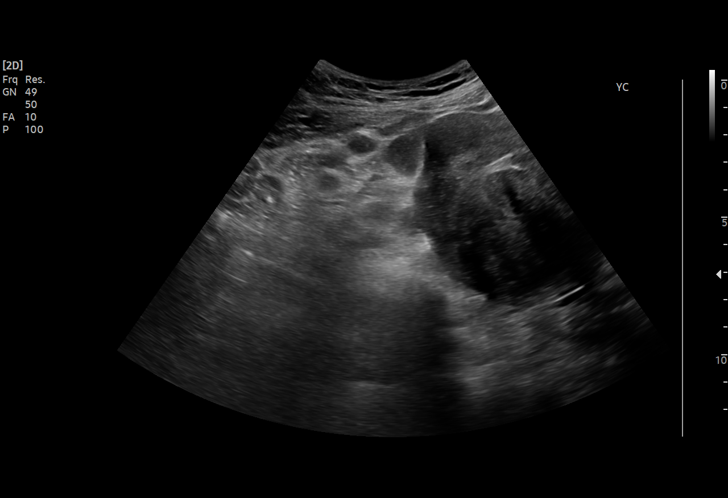
[im 36/95]
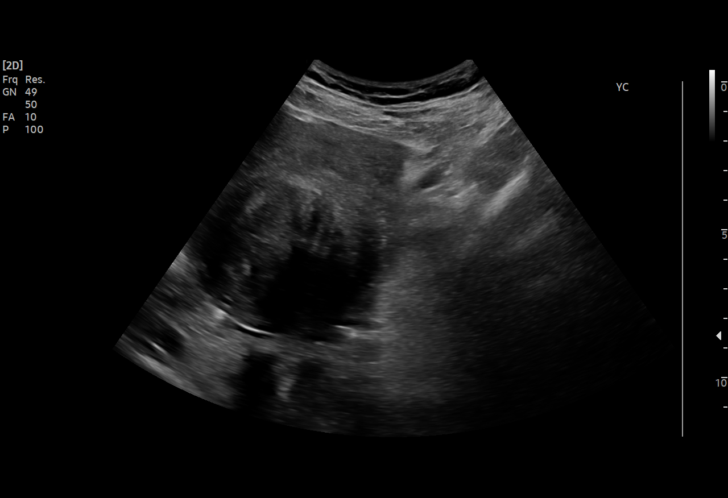
[im 40/95]
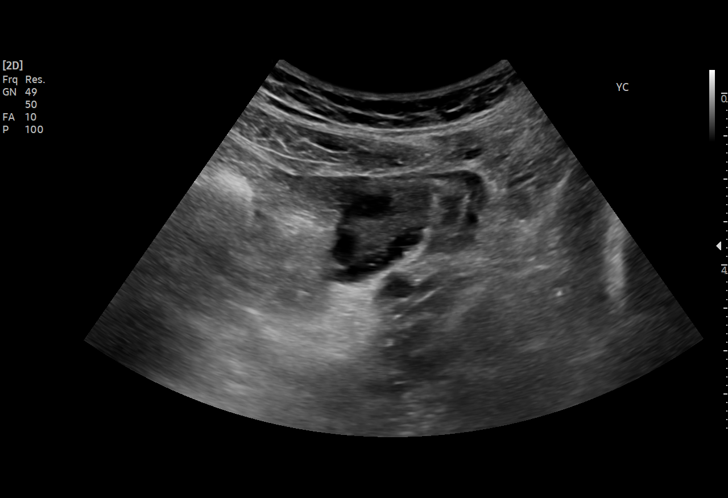
[im 48/95]
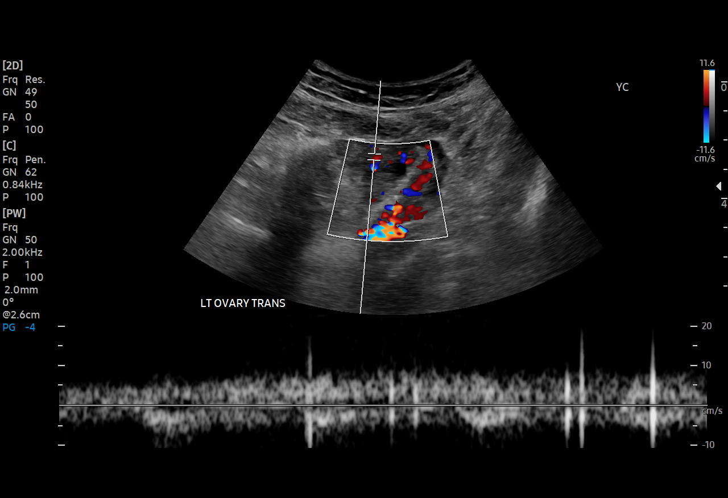
[im 55/95]
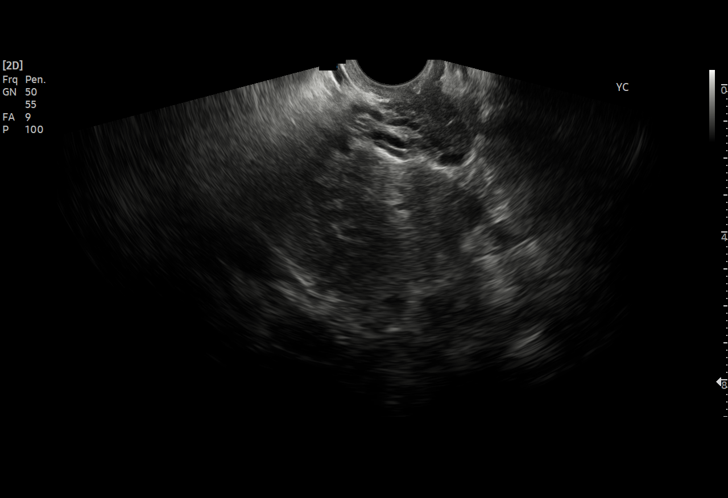
[im 59/95]
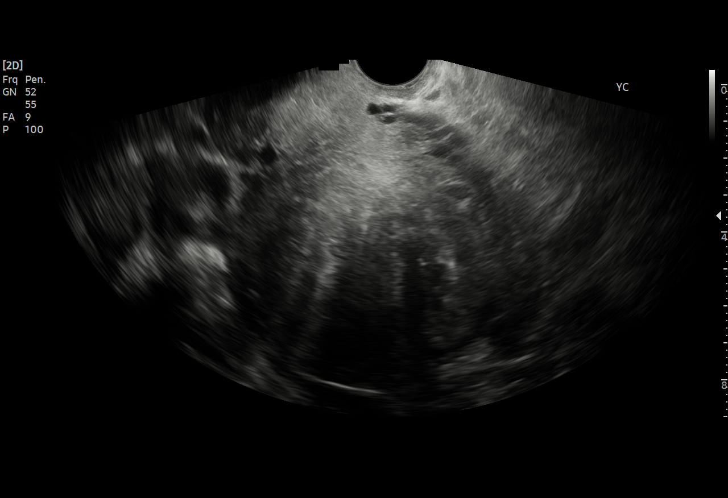
[im 67/95]
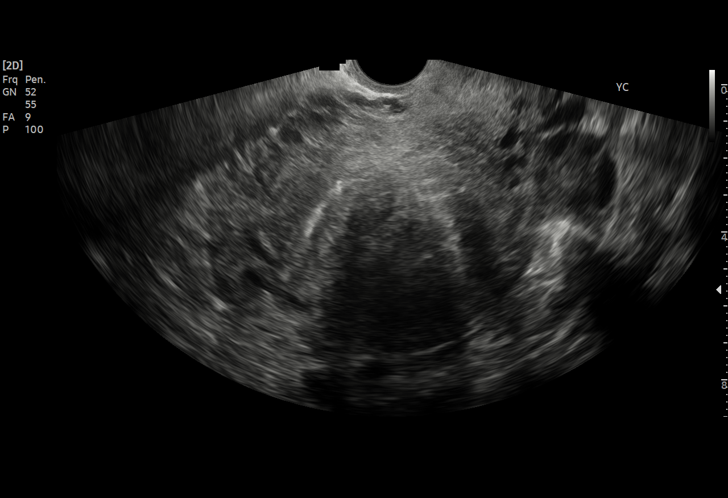
[im 75/95]
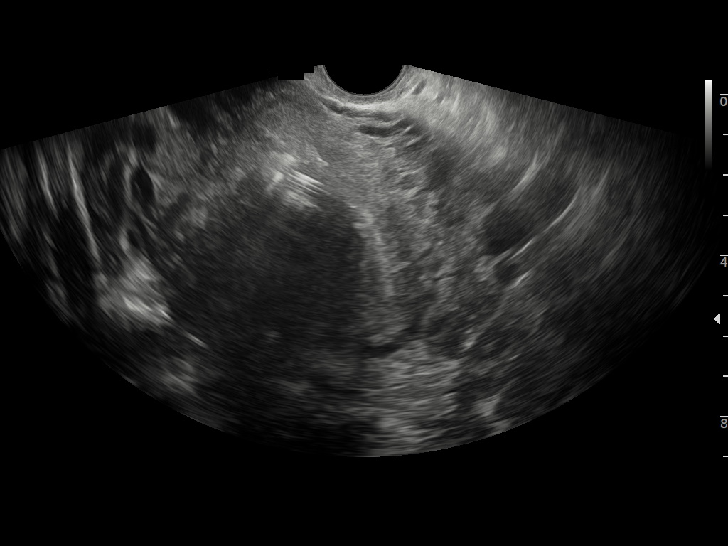
[im 79/95]
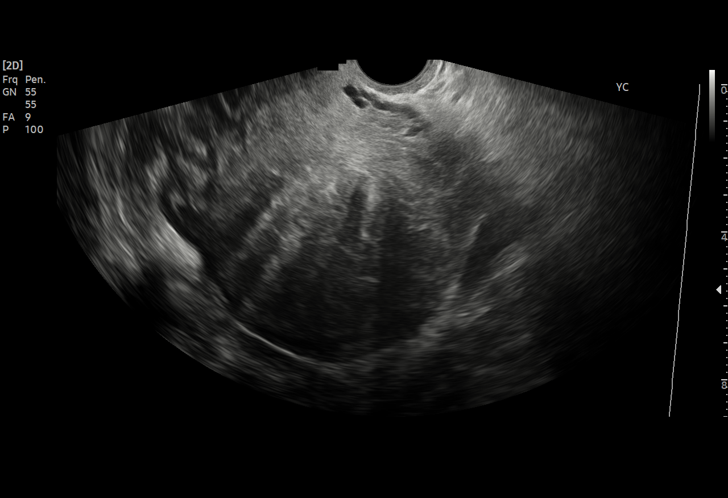
[im 87/95]
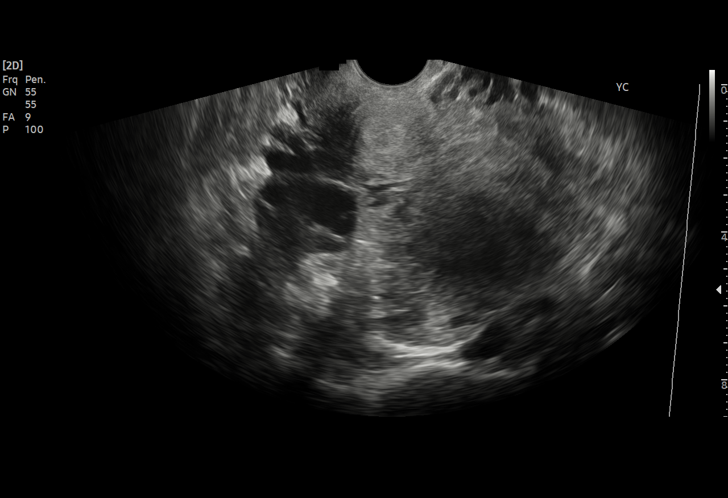
[im 95/95]
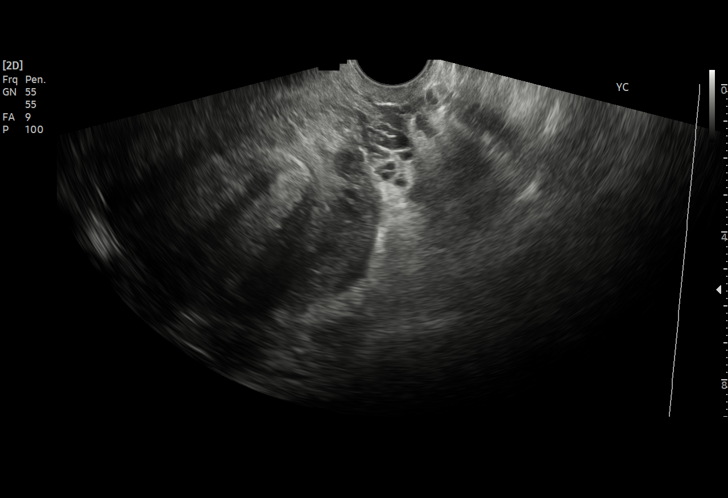

[15 of 25 positions shown; findings below may reference images not displayed]

FINDINGS: Uterus

Measurements: 12.0 x 7.2 x 7.7 cm = volume: 12 mL. A mass in the
posterior aspect of the uterine body measures 5.5 x 4.7 x 5.9 cm. A
mass in the uterine fundus measures 2.6 x 1.9 x 2.4 cm. These likely
represent fibroadenomas.

Endometrium

Thickness: 8 mm. No focal abnormality visualized, however
visualization is limited due to the patient has IUD.

Right ovary

Measurements: 4.7 x 2.7 x 2.8 cm = volume: 18.5 mL. Normal
appearance/no adnexal mass.

Left ovary

Measurements: 4.3 x 2.0 x 2.1 cm = volume: 9.6 mL. Normal
appearance/no adnexal mass.

Pulsed Doppler evaluation of both ovaries demonstrates normal
low-resistance arterial and venous waveforms.

Other findings

No abnormal free fluid.
IMPRESSION: 1. Masses in the uterus likely represent fibroadenomas.
2. No abnormality of the endometrium, however visualization is
limited due to the presence of an IUD.

## 2023-12-19 ENCOUNTER — Ambulatory Visit: Admitting: Family Medicine

## 2023-12-19 ENCOUNTER — Encounter: Payer: Self-pay | Admitting: Family Medicine

## 2023-12-19 ENCOUNTER — Ambulatory Visit: Admitting: Physical Therapy

## 2023-12-19 VITALS — BP 127/91 | HR 80 | Ht 62.0 in | Wt 184.0 lb

## 2023-12-19 DIAGNOSIS — R102 Pelvic and perineal pain: Secondary | ICD-10-CM | POA: Diagnosis not present

## 2023-12-19 DIAGNOSIS — R7989 Other specified abnormal findings of blood chemistry: Secondary | ICD-10-CM

## 2023-12-19 DIAGNOSIS — G8929 Other chronic pain: Secondary | ICD-10-CM

## 2023-12-19 DIAGNOSIS — R928 Other abnormal and inconclusive findings on diagnostic imaging of breast: Secondary | ICD-10-CM

## 2023-12-19 DIAGNOSIS — E8940 Asymptomatic postprocedural ovarian failure: Secondary | ICD-10-CM | POA: Diagnosis not present

## 2023-12-19 DIAGNOSIS — Z23 Encounter for immunization: Secondary | ICD-10-CM

## 2023-12-19 MED ORDER — PREMARIN 0.625 MG/GM VA CREA: TOPICAL_CREAM | VAGINAL | 12 refills | Status: DC

## 2023-12-19 MED ORDER — PREMARIN 0.625 MG/GM VA CREA: 1.0000 | TOPICAL_CREAM | Freq: Every day | VAGINAL | 12 refills | Status: DC

## 2023-12-19 MED ORDER — METHOCARBAMOL 500 MG PO TABS: 500.0000 mg | ORAL_TABLET | Freq: Two times a day (BID) | ORAL | 1 refills | Status: DC | PRN

## 2023-12-19 NOTE — Progress Notes (Unsigned)
    SUBJECTIVE:   CHIEF COMPLAINT / HPI:   Pain In abdomen, back. Swelling in her feet. In pelvic floor PT.   Doing Gabapentin  for pain.   PERTINENT  PMH / PSH: ***  OBJECTIVE:   BP (!) 127/91   Pulse 80   Ht 5\' 2"  (1.575 m)   Wt 184 lb (83.5 kg)   SpO2 95%   BMI 33.65 kg/m  ***  General: NAD, pleasant, able to participate in exam Cardiac: RRR, no murmurs. Respiratory: CTAB, normal effort, No wheezes, rales or rhonchi Abdomen: Bowel sounds present, nontender, nondistended Extremities: no edema or cyanosis. Skin: warm and dry, no rashes noted Neuro: alert, no obvious focal deficits Psych: Normal affect and mood  ASSESSMENT/PLAN:   No problem-specific Assessment & Plan notes found for this encounter.     Dr. Jonne Netters, DO [facility]    {    This will disappear when note is signed, click to select method of visit    :1}

## 2023-12-19 NOTE — Patient Instructions (Signed)
 It was wonderful to see you today! Thank you for choosing [facility].   Please bring ALL of your medications with you to every visit.   Today we talked about:  Unfortunately I think a lot for your is due to your hysterectomy where they took your ovaries and now you are in menopause.  I would like to start with the vaginal estrogen cream that you can put in daily at least for the next 4 weeks.  After that time if your symptoms have improved we could go to maintenance twice weekly.  Please keep in mind it can transfer to her partner so avoid intercourse a few hours after you apply it.  I do also think you may be a candidate for oral hormone replacement therapy but I would like you to get the mammogram as below to make sure there is no concerns for underlying breast issues. We are rechecking her thyroid  today I will follow-up with those results. I sent in the methocarbamol for pain that I would recommend he start taking at night first to see if it makes you sleepy.  If you do okay with it you can also take it once during the day as this will help relax your muscles.  As we discussed any additional pain management with narcotics would need to go through pain management.   Please also provide the work accommodation letter for increased breaks and try compression socks to help with the swelling in your legs  You are due for a mammogram.  I ordered the mammogram, please call the Breast Center using the information provided below to schedule:  [facility] [address] [place],  [place]  [address] [phone]   Please follow up in 4 to 6 weeks   We are checking some labs today. If they are abnormal, I will call you. If they are normal, I will send you a MyChart message (if it is active) or a letter in the mail. If you do not hear about your labs in the next 2 weeks, please call the office.  Call the clinic at [phone] if your symptoms worsen or you have  any concerns.  Please be sure to schedule follow up at the front desk before you leave today.   Jonne Netters, DO Family Medicine

## 2023-12-20 ENCOUNTER — Other Ambulatory Visit: Payer: Self-pay | Admitting: Family Medicine

## 2023-12-20 ENCOUNTER — Encounter: Payer: Self-pay | Admitting: Family Medicine

## 2023-12-20 DIAGNOSIS — E8940 Asymptomatic postprocedural ovarian failure: Secondary | ICD-10-CM | POA: Insufficient documentation

## 2023-12-20 DIAGNOSIS — R928 Other abnormal and inconclusive findings on diagnostic imaging of breast: Secondary | ICD-10-CM

## 2023-12-20 LAB — TSH: TSH: 0.504 u[IU]/mL (ref 0.450–4.500)

## 2023-12-20 NOTE — Assessment & Plan Note (Signed)
 As discussed above, bilateral benign breast masses.  Will follow-up imaging and may need referral to breast surgeon.  Denies family history of breast cancer. -Diagnostic mammogram

## 2023-12-20 NOTE — Assessment & Plan Note (Signed)
 Per chart review appears patient had total hysterectomy with bilateral salpingo-oophorectomy, unclear why she had bilateral ovaries removed.  Essentially in postmenopausal state, not currently receiving HRT or topical estrogen.  Mammogram from 11/2022 reviewed, showed bilateral likely benign breast masses but has not had follow-up imaging since that time.  Would like to see further evaluation of breast masses prior to considering HRT although no other contraindications noted.  Given pelvic pain and dryness ongoing will initiate topical vaginal estrogen. -Start vaginal Premarin daily

## 2023-12-26 ENCOUNTER — Ambulatory Visit: Admitting: Physical Therapy

## 2024-01-10 ENCOUNTER — Ambulatory Visit: Admitting: Physical Therapy

## 2024-01-25 ENCOUNTER — Other Ambulatory Visit: Payer: Self-pay | Admitting: Family Medicine

## 2024-01-25 DIAGNOSIS — E8940 Asymptomatic postprocedural ovarian failure: Secondary | ICD-10-CM

## 2024-01-25 DIAGNOSIS — N6310 Unspecified lump in the right breast, unspecified quadrant: Secondary | ICD-10-CM

## 2024-01-25 DIAGNOSIS — R7989 Other specified abnormal findings of blood chemistry: Secondary | ICD-10-CM

## 2024-01-25 DIAGNOSIS — R928 Other abnormal and inconclusive findings on diagnostic imaging of breast: Secondary | ICD-10-CM

## 2024-01-25 DIAGNOSIS — Z23 Encounter for immunization: Secondary | ICD-10-CM

## 2024-01-25 DIAGNOSIS — G8929 Other chronic pain: Secondary | ICD-10-CM

## 2024-02-01 ENCOUNTER — Other Ambulatory Visit

## 2024-02-26 ENCOUNTER — Other Ambulatory Visit (HOSPITAL_BASED_OUTPATIENT_CLINIC_OR_DEPARTMENT_OTHER)

## 2024-03-13 ENCOUNTER — Other Ambulatory Visit (HOSPITAL_BASED_OUTPATIENT_CLINIC_OR_DEPARTMENT_OTHER): Payer: Self-pay | Admitting: Obstetrics & Gynecology

## 2024-03-13 DIAGNOSIS — R110 Nausea: Secondary | ICD-10-CM

## 2024-03-20 ENCOUNTER — Other Ambulatory Visit (HOSPITAL_COMMUNITY)
Admission: RE | Admit: 2024-03-20 | Discharge: 2024-03-20 | Disposition: A | Discharge status: Home / Self Care | Source: Ambulatory Visit | Attending: Family Medicine | Admitting: Family Medicine

## 2024-03-20 ENCOUNTER — Ambulatory Visit (INDEPENDENT_AMBULATORY_CARE_PROVIDER_SITE_OTHER)

## 2024-03-20 VITALS — BP 125/89 | HR 80 | Ht 62.0 in | Wt 177.6 lb

## 2024-03-20 DIAGNOSIS — R5383 Other fatigue: Secondary | ICD-10-CM | POA: Diagnosis not present

## 2024-03-20 DIAGNOSIS — R197 Diarrhea, unspecified: Secondary | ICD-10-CM | POA: Diagnosis not present

## 2024-03-20 DIAGNOSIS — Z113 Encounter for screening for infections with a predominantly sexual mode of transmission: Secondary | ICD-10-CM

## 2024-03-20 NOTE — Progress Notes (Unsigned)
 SUBJECTIVE:   CHIEF COMPLAINT / HPI:   [name] is a 38 year old female who presents to the clinic for primary complaint of STD testing.  She would also like to talk about fatigue.  Patient states that she has a new partner and she would like to get tested today.  She does not use contraception or protection and is not interested in contraception at this time since she had TLH/bilateral salpingectomy/cystoscopy done 06/14/2023.   The patient states that her energy has been very low for the last few days.  This happened previously in March 2025 and her OB/GYN did lab workup for it.  She was found to have vitamin D  deficiency and a slightly low TSH.  She was placed on a 12-week course of weekly 50,000 IUs of vitamin D .  She only took 4 of these.  Her TSH was checked by her PCP the following month and had normalized.  She cannot pinpoint anything that helped at that point.  She also reports decreased appetite since her total hysterectomy surgery.  She has consistent watery diarrhea which is nonbloody and without mucus.  This has been going on since her surgery as well.  She states anything she eats almost immediately comes out.  She denies any fecal incontinence.  She does not watch her diet but does report worsening of diarrhea with greasy and fried foods, alcoholic drinks, and coffee.  She has daily nausea and has been placed on Zofran  by her OB/GYN for this.  To help control the diarrhea she takes Tums and Pepto-Bismol first thing in the morning.  This does not really help the diarrhea itself but she feels the need to continue to take it.  She does endorse pelvic pain however this has been consistent and intermittent since her hysterectomy.  Her OB/GYN Dr. Cleotilde has sent her for pelvic floor therapy but after the first session she could not continue due to scheduling issues.  The patient denies any family history of celiac disease, colon cancer, any personal travel outside the country within the last  year.  She has not tried to eat anything new, she has been on chronic gabapentin  for her chronic pelvic pain however she only takes it when she absolutely needs it so only 2-3 times a week.  She has not noticed this exacerbating her diarrhea.  Her partner has never told her that she wakes up gasping in the middle of the night, she has been told she snores softly but not loud enough to hear through walls.  She often does not wake up rested and feels like she could fall asleep standing during the day.   PERTINENT  PMH / PSH: Total hysterectomy and bilateral salpingectomy October 2024, vitamin D  deficiency  OBJECTIVE:   BP 125/89   Pulse 80   Ht 5' 2 (1.575 m)   Wt 177 lb 9.6 oz (80.6 kg)   SpO2 99%   BMI 32.48 kg/m    General: A&O, NAD HEENT: No sign of trauma, EOM grossly intact Cardiac: RRR, no m/r/g Respiratory: CTAB, normal WOB, no w/c/r GI: Soft, non-distended, no masses, no overlying skin changes, diffuse striae distensae, mild TTP across entire lower abdomen Extremities: NTTP, no peripheral edema. Neuro: Normal gait, moves all four extremities appropriately. Psych: Appropriate mood and affect  GU: GU: normal appearing external genitalia without lesions. Vagina is moist with white, conglomerated discharge. Cervix normal in appearance. Fishy odor present. Chaperone present: Cassell Mary, CMA   ASSESSMENT/PLAN:   Assessment &  Plan Fatigue, unspecified type Intermittent, previously worked up for in March 2025 by OB/GYN Dr. Cleotilde.  She did not take her vitamin D  prescription as prescribed at that time. -Labs including CBC, CMP, TSH with T4 reflex, hemoglobin A1c, iron, ferritin, TIBC, vitamin D , and HIV ordered. -Sleep study ordered. -Return to see PCP in 1 month to discuss results and follow up on fatigue.  Diarrhea, unspecified type Unknown cause.  -GI stool and ova and parasite panels ordered. -Keep food diary.  Screening examination for STD (sexually transmitted  disease) New partner. Declines contraception at this time secondary to total hysterectomy.  -Cervicovaginal ancillary testing sent.  -RPR ordered.    Camie Dixons, DO [place] [facility]

## 2024-03-20 NOTE — Patient Instructions (Addendum)
 It was wonderful to see you today.  Please bring ALL of your medications with you to every visit.   Today we talked about:  Lab work and sleep study for your fatigue.  Fecal testing for your diarrhea.  I will contact you with results.  See your PCP back in 1 month.   Thank you for choosing [facility].   Please call [phone] with any questions about today's appointment.  Please arrive at least 15 minutes prior to your scheduled appointments.   If you had blood work today, I will send you a MyChart message or a letter if results are normal. Otherwise, I will give you a call.   If you had a referral placed, they will call you to set up an appointment. Please give us  a call if you don't hear back in the next 2 weeks.   If you need additional refills before your next appointment, please call your pharmacy first.   You should follow up in our clinic in 1 month with PCP.   Camie Dixons, DO Family Medicine

## 2024-03-22 DIAGNOSIS — R197 Diarrhea, unspecified: Secondary | ICD-10-CM | POA: Diagnosis not present

## 2024-03-22 LAB — CERVICOVAGINAL ANCILLARY ONLY
Bacterial Vaginitis (gardnerella): POSITIVE — AB
Candida Glabrata: NEGATIVE
Candida Vaginitis: POSITIVE — AB
Chlamydia: NEGATIVE
Comment: NEGATIVE
Comment: NEGATIVE
Comment: NEGATIVE
Comment: NEGATIVE
Comment: NEGATIVE
Comment: NORMAL
Neisseria Gonorrhea: NEGATIVE
Trichomonas: NEGATIVE

## 2024-03-24 ENCOUNTER — Ambulatory Visit: Payer: Self-pay

## 2024-03-24 LAB — GI PROFILE, STOOL, PCR

## 2024-03-24 MED ORDER — METRONIDAZOLE 500 MG PO TABS: 500.0000 mg | ORAL_TABLET | Freq: Two times a day (BID) | ORAL | 0 refills | Status: DC

## 2024-03-24 MED ORDER — FLUCONAZOLE 150 MG PO TABS: 150.0000 mg | ORAL_TABLET | Freq: Once | ORAL | 0 refills | Status: AC

## 2024-03-27 LAB — OVA AND PARASITE EXAMINATION

## 2024-04-24 ENCOUNTER — Other Ambulatory Visit: Payer: Self-pay | Admitting: Neurology
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
## 2024-05-21 ENCOUNTER — Encounter: Payer: Self-pay | Admitting: Neurology

## 2024-05-21 ENCOUNTER — Ambulatory Visit: Admitting: Neurology

## 2024-05-21 ENCOUNTER — Other Ambulatory Visit: Payer: Self-pay | Admitting: Family Medicine

## 2024-05-21 VITALS — BP 143/94 | HR 78 | Ht 62.0 in | Wt 175.0 lb

## 2024-05-21 DIAGNOSIS — I10 Essential (primary) hypertension: Secondary | ICD-10-CM

## 2024-05-21 DIAGNOSIS — G43101 Migraine with aura, not intractable, with status migrainosus: Secondary | ICD-10-CM | POA: Diagnosis not present

## 2024-05-21 MED ORDER — PROPRANOLOL HCL 40 MG PO TABS: 40.0000 mg | ORAL_TABLET | Freq: Two times a day (BID) | ORAL | 5 refills | Status: DC

## 2024-05-21 MED ORDER — UBRELVY 100 MG PO TABS: 1.0000 | ORAL_TABLET | ORAL | 5 refills | Status: DC | PRN

## 2024-05-21 NOTE — Patient Instructions (Signed)
  Start propranolol  40mg  twice daily.  Contact us  in 4 weeks with update and we can increase dose if needed. Take Ubrelvy  at earliest onset of headache.  May repeat dose once in 2 hours if needed.  Maximum 2 tablets in 24 hours. ALTERNATIVELY, try sample of Zavzpret nasal spray (once daily as needed).  Let me know if you prefer instead. Take Ondansetron /Zofran  for nausea Limit use of pain relievers to no more than 9 days out of the month.  These medications include acetaminophen , NSAIDs (ibuprofen /Advil /Motrin , naproxen /Aleve , triptans (Imitrex /sumatriptan ), Excedrin, and narcotics.  This will help reduce risk of rebound headaches. Be aware of common food triggers:  - Caffeine :  coffee, black tea, cola, Mt. Dew  - Chocolate  - Dairy:  aged cheeses (brie, blue, cheddar, gouda, Parmasan, provolone, romano, Swiss, etc), chocolate milk, buttermilk, sour cream, limit eggs and yogurt  - Nuts, peanut butter  - Alcohol  - Cereals/grains:  FRESH breads (fresh bagels, sourdough, doughnuts), yeast productions  - Processed/canned/aged/cured meats (pre-packaged deli meats, hotdogs)  - MSG/glutamate:  soy sauce, flavor enhancer, pickled/preserved/marinated foods  - Sweeteners:  aspartame (Equal, Nutrasweet).  Sugar and Splenda are okay  - Vegetables:  legumes (lima beans, lentils, snow peas, fava beans, pinto peans, peas, garbanzo beans), sauerkraut, onions, olives, pickles  - Fruit:  avocados, bananas, citrus fruit (orange, lemon, grapefruit), mango  - Other:  Frozen meals, macaroni and cheese Routine exercise Stay adequately hydrated (aim for 64 oz water  daily) Keep headache diary Maintain proper stress management Maintain proper sleep hygiene Do not skip meals Consider supplements:  magnesium citrate 400mg  daily, riboflavin 400mg  daily, coenzyme Q10 100mg  three times daily.

## 2024-06-03 ENCOUNTER — Other Ambulatory Visit (HOSPITAL_BASED_OUTPATIENT_CLINIC_OR_DEPARTMENT_OTHER): Payer: Self-pay | Admitting: Obstetrics & Gynecology

## 2024-06-03 DIAGNOSIS — R110 Nausea: Secondary | ICD-10-CM

## 2024-06-04 ENCOUNTER — Other Ambulatory Visit (HOSPITAL_BASED_OUTPATIENT_CLINIC_OR_DEPARTMENT_OTHER): Payer: Self-pay | Admitting: Obstetrics & Gynecology

## 2024-06-04 DIAGNOSIS — R110 Nausea: Secondary | ICD-10-CM

## 2024-06-12 ENCOUNTER — Telehealth (HOSPITAL_BASED_OUTPATIENT_CLINIC_OR_DEPARTMENT_OTHER): Payer: Self-pay

## 2024-06-12 NOTE — Telephone Encounter (Signed)
 Message sent to [facility]. tbw

## 2024-06-12 NOTE — Telephone Encounter (Signed)
 Patient would like to get a refill on her rx, states the pharmacy declined it. ZOFRAN -ODT) 4 MG

## 2024-06-17 ENCOUNTER — Other Ambulatory Visit (HOSPITAL_BASED_OUTPATIENT_CLINIC_OR_DEPARTMENT_OTHER): Payer: Self-pay | Admitting: Obstetrics & Gynecology

## 2024-06-17 DIAGNOSIS — R110 Nausea: Secondary | ICD-10-CM

## 2024-06-17 MED ORDER — ONDANSETRON 4 MG PO TBDP: 4.0000 mg | ORAL_TABLET | Freq: Three times a day (TID) | ORAL | 0 refills | Status: DC | PRN

## 2024-07-17 ENCOUNTER — Other Ambulatory Visit: Payer: Self-pay | Admitting: Family Medicine

## 2024-07-17 DIAGNOSIS — F410 Panic disorder [episodic paroxysmal anxiety] without agoraphobia: Secondary | ICD-10-CM

## 2024-07-22 ENCOUNTER — Encounter: Payer: Self-pay | Admitting: Family Medicine

## 2024-07-22 ENCOUNTER — Other Ambulatory Visit: Payer: Self-pay | Admitting: Family Medicine

## 2024-07-22 ENCOUNTER — Other Ambulatory Visit (HOSPITAL_COMMUNITY): Payer: Self-pay

## 2024-07-22 ENCOUNTER — Ambulatory Visit: Admitting: Family Medicine

## 2024-07-22 ENCOUNTER — Telehealth: Payer: Self-pay

## 2024-07-22 VITALS — BP 129/97 | HR 79 | Ht 62.0 in | Wt 175.8 lb

## 2024-07-22 DIAGNOSIS — F411 Generalized anxiety disorder: Secondary | ICD-10-CM | POA: Diagnosis not present

## 2024-07-22 DIAGNOSIS — F410 Panic disorder [episodic paroxysmal anxiety] without agoraphobia: Secondary | ICD-10-CM | POA: Diagnosis not present

## 2024-07-22 DIAGNOSIS — I10 Essential (primary) hypertension: Secondary | ICD-10-CM

## 2024-07-22 DIAGNOSIS — M6289 Other specified disorders of muscle: Secondary | ICD-10-CM

## 2024-07-22 DIAGNOSIS — R102 Pelvic and perineal pain unspecified side: Secondary | ICD-10-CM

## 2024-07-22 DIAGNOSIS — R110 Nausea: Secondary | ICD-10-CM | POA: Diagnosis not present

## 2024-07-22 DIAGNOSIS — L089 Local infection of the skin and subcutaneous tissue, unspecified: Secondary | ICD-10-CM

## 2024-07-22 DIAGNOSIS — G8929 Other chronic pain: Secondary | ICD-10-CM | POA: Diagnosis not present

## 2024-07-22 MED ORDER — METHOCARBAMOL 500 MG PO TABS: 500.0000 mg | ORAL_TABLET | Freq: Two times a day (BID) | ORAL | 1 refills | Status: AC | PRN

## 2024-07-22 MED ORDER — ONDANSETRON 4 MG PO TBDP: 4.0000 mg | ORAL_TABLET | Freq: Three times a day (TID) | ORAL | 0 refills | Status: AC | PRN

## 2024-07-22 MED ORDER — DOXYCYCLINE HYCLATE 100 MG PO TABS: 100.0000 mg | ORAL_TABLET | Freq: Two times a day (BID) | ORAL | 0 refills | Status: AC

## 2024-07-22 MED ORDER — AIMOVIG 140 MG/ML ~~LOC~~ SOAJ: 140.0000 mg | SUBCUTANEOUS | 11 refills | Status: AC

## 2024-07-22 MED ORDER — VITAMIN D (ERGOCALCIFEROL) 1.25 MG (50000 UNIT) PO CAPS: 50000.0000 [IU] | ORAL_CAPSULE | ORAL | 0 refills | Status: AC

## 2024-07-22 MED ORDER — AMLODIPINE BESYLATE 5 MG PO TABS: 5.0000 mg | ORAL_TABLET | Freq: Every day | ORAL | 1 refills | Status: AC

## 2024-07-22 MED ORDER — HYDROXYZINE HCL 25 MG PO TABS: 25.0000 mg | ORAL_TABLET | Freq: Three times a day (TID) | ORAL | 3 refills | Status: AC | PRN

## 2024-07-22 MED ORDER — UBRELVY 100 MG PO TABS: 1.0000 | ORAL_TABLET | ORAL | 5 refills | Status: AC | PRN

## 2024-07-22 MED ORDER — GABAPENTIN 100 MG PO CAPS: 100.0000 mg | ORAL_CAPSULE | Freq: Every day | ORAL | 0 refills | Status: DC

## 2024-07-22 MED ORDER — PROPRANOLOL HCL 40 MG PO TABS: 40.0000 mg | ORAL_TABLET | Freq: Two times a day (BID) | ORAL | 5 refills | Status: AC

## 2024-07-22 NOTE — Patient Instructions (Signed)
 It was wonderful to see you today.  Please bring ALL of your medications with you to every visit.   Today we talked about:  Belly button piercing infection - I believe you have an infection of the skin here. I have prescribed you a week of antibiotics. Please remove the piercing and keep the area clean and dry.   For your nausea you should make a follow up appointment with your PCP to discuss further.   Please take your blood pressure medicines daily and send a message or call our clinic if you do not get the refill.   I have added resources for therapy below.   Please follow up in 2-4 weeks   Thank you for choosing [facility].   Please call [phone] with any questions about today's appointment.  Please be sure to schedule follow up at the front desk before you leave today.   Areta Saliva, MD  Family Medicine     Therapy and Counseling Resources Most providers on this list will take Medicaid. Patients with commercial insurance or Medicare should contact their insurance company to get a list of in network providers.  [facility] (takes children) Location 1: [address], [address] [place], [place] [address] Location 2: [address] [place], [place] [address] [phone]   [facility] (spanish speaking therapist available)(habla espanol)(take medicare and medicaid)  [address], [place], [place] [address], [place]  [email or web address] [phone]  [facility]ing [address] [place], [place] [address] [phone]  [facility]   [address] [place], [place] [address]      [phone]  [facility] (spanish available) [address]  [place], [place] [address] [phone]  [facility] (take [facility] and medicare) [address]  [place], [place] [address]       [phone]     [facility] (virtual only) [phone]  Jan[facility] [address] [place], [place] [phone]  [facility]:  [address] [place] [phone]  [facility]:  [address] [place] [phone]  [facility] (under & uninsured) [address]   [place] [phone]    [email or web address]    [facility] [address]  [place]    [phone]  [facility] -[address]     Phone:  [phone]     [facility]-  [address]  [phone]   [facility] [address]      [place], [place] [phone]  [facility]: [address] [place], [place], [place]  [phone]   [facility] (Spanish therapist) [email or web address]  [address] [place]  [address]   [place] [address]    [phone]    [facility]   [address],  [place], [place]  [phone]   [facility]  [address] [place]  [phone]  [facility]  [address] [place], [place]        [phone]  Open Access/Walk In Clinic under & uninsured  [facility]  [address] [place], [place] [phone] Crisis [phone]  [facility] [address],  (Spanish)   [address] , [place]: [phone]) 8:30 - 12; 1 - 2:30  [facility] [place]  [facility],  [address] [place]    ([phone]):8:30 - 12; 2 - 3PM  [facility],  [address]  [place]; [phone]):   Mon - Fri 8 AM - 5 PM  [facility] [address] [place]  MWF 12:30 to 3:00 or call to schedule an appointment  [phone]  Specific Provider options Psychology Today  [email or web address]  click on find a therapist  enter your zip code left side and select or tailor a therapist  for your specific need.   [facility] Provider Directory [email or web address]  (Medicaid)   Follow all drop down to find a provider  Social Support program [facility] [phone] or photosolver.pl [address], [place], [place] Recovery support and educational   24- Hour Availability:   [facility]  [address] [place], [place] [phone] Crisis [phone]  [facility] [phone]  [facility]  [phone]   [facility]  [phone] (after hours)  Therapeutic Alternative/Mobile Crisis   [phone]  [place]  National Suicide Hotline  5062710380 MERRILYN)  Call 911 or go to emergency room  [facility]  [phone]);  [place]  [phone]); [place], [place], [place], [place], [place], [place], [place]

## 2024-07-22 NOTE — Progress Notes (Unsigned)
   SUBJECTIVE:   CHIEF COMPLAINT / HPI:  Discussed the use of AI scribe software for clinical note transcription with the patient, who gave verbal consent to proceed.  History of Present Illness [name] is a 38 year old female who presents with a swollen and painful belly button.  Periumbilical swelling and pain - Swelling and pain in the belly button for three days - Area is swollen and slightly bulging - No drainage or fluid from the area - Pain is intermittent, lasting a few seconds at a time, but hurts throughout the day. Pain does not take ehr breath away.  - No fever - Got her belly button pierced in June or July. No issues until now.   Hypertension - Takes amlodipine  for blood pressure control - Spacing doses of amlodipine  due to low supply  Anxiety and depression symptoms - Experiences anxiety and depression symptoms - Recent worsening of symptoms. PHQ9 score of 11 increased from prior.  - Not on specific depression medication - Not receiving counseling, but is interested  - Hydroxyzine  for anxiety is depleted  Medication refills:  Patient asks for refills of many chronic medications  Has significant endometriosis pain even despite hysterectomy. Still following with OBGYN. Received muscle relaxer to use once a month for this pain.  Also takes zofran  for nausea related to migraines per her neurologist as well.     PERTINENT  PMH / PSH: umbilical hernia noted during previous surgery   OBJECTIVE:  BP (!) 129/97   Pulse 79   Ht 5' 2 (1.575 m)   Wt 175 lb 12.8 oz (79.7 kg)   SpO2 100%   BMI 32.15 kg/m   General: well appearing, in no acute distress CV: RRR, radial pulses equal and palpable, no BLE edema  Resp: Normal work of breathing on room air Abd: Soft, non tender, non distended, no palpable umbilical hernia at rest. Piercing above umbilicus with approximately 1 cm raised erythematous papule, serosanguinous drainage, unable to express further, no  surrounding erythema or fluctuance.  Neuro: Alert & Oriented   ASSESSMENT/PLAN:   Assessment & Plan Skin infection Swelling and soreness near the belly button piercing, likely infected. Most likely superficial and does not need to be drained at this time.  - One week of doxycycline .  - Advised removal of belly button piercing. Generalized anxiety disorder with panic attacks GAD with some possible depression as well with increase in PHQ9 score.  - Encouraged patient to reach out to a therapist and provided resources  Essential hypertension Uncontrolled due to lack of access to medications. Encouraged patient to reach out sooner via mychart or phone call if she does not hear back from pharmacy.  - Refilled amlodipine .  - Follow up for blood pressure recheck in 2-4 weeks      Areta Saliva, MD [facility]

## 2024-07-22 NOTE — Telephone Encounter (Addendum)
 PA pending.  Awaiting clinical questions and chart notes to close.  Per chart notes from appt with neuro 05/21/24. Patient was stopping Aimovig  since migraines werent controlled.  Will f/u once chart notes are closed from 07/22/24 appt with primary care.

## 2024-07-24 NOTE — Telephone Encounter (Signed)
 I rec'd a PA request for this medication. (Key [id number])  It wasn't mentioned at her appt with you (I believe it was just sent as a refill).   On 05/21/24 (at neuro) it looks like she discussed stopping this since her migraines werent controlled/ effecting her work.

## 2024-08-21 ENCOUNTER — Ambulatory Visit: Payer: Self-pay

## 2024-08-29 ENCOUNTER — Telehealth: Payer: Self-pay | Admitting: *Deleted

## 2024-08-29 DIAGNOSIS — F419 Anxiety disorder, unspecified: Secondary | ICD-10-CM

## 2024-08-29 NOTE — Progress Notes (Unsigned)
 Complex Care Management Note  Care Guide Note 08/29/2024 Name: [name] MRN: [id number] DOB: 1986/06/20  [name] is a 39 y.o. year old female who sees Theophilus Pagan, MD for primary care. I reached out to [name] by phone today to offer complex care management services.  [name] was given information about Complex Care Management services today including:   The Complex Care Management services include support from the care team which includes your Nurse Care Manager, Clinical Social Worker, or Pharmacist.  The Complex Care Management team is here to help remove barriers to the health concerns and goals most important to you. Complex Care Management services are voluntary, and the patient may decline or stop services at any time by request to their care team member.   Complex Care Management Consent Status: Patient agreed to services and verbal consent obtained.   Follow up plan:  Telephone appointment with complex care management team member scheduled for:  09/11/24  Encounter Outcome:  Patient Scheduled  Harlene Satterfield  [facility]  [facility], [facility] Guide  Direct Dial: [phone]  Fax [phone]

## 2024-09-11 ENCOUNTER — Other Ambulatory Visit: Payer: Self-pay | Admitting: *Deleted

## 2024-09-12 NOTE — Patient Instructions (Signed)
 [name] - I am sorry  you where unable to complete outreach today.  I have rescheduled your appointment as requested  for 09/20/24 @ 3 pm. I was unable to reach you today for our scheduled appointment. I work with Theophilus Pagan, MD and am calling to support your healthcare needs. Please contact me at [phone] at your earliest convenience. I look forward to speaking with you soon.   Thank you,  Geary Rufo, RN, BSN, ACM RN Care Manager [employer] [phone]

## 2024-09-20 ENCOUNTER — Telehealth: Payer: Self-pay | Admitting: *Deleted

## 2024-09-24 ENCOUNTER — Telehealth: Payer: Self-pay | Admitting: *Deleted

## 2024-09-24 NOTE — Progress Notes (Unsigned)
 Complex Care Management Care Guide Note  09/24/2024 Name: [name] MRN: [id number] DOB: Mar 31, 1986  [name] is a 39 y.o. year old female who is a primary care patient of Theophilus Pagan, MD and is actively engaged with the care management team. I reached out to [name] by phone today to assist with re-scheduling  with the RN Case Manager.  Follow up plan: Unsuccessful telephone outreach attempt made. A HIPAA compliant phone message was left for the patient providing contact information and requesting a return call.  Harlene Satterfield  [facility]  [facility], [facility] Guide  Direct Dial: [phone]  Fax [phone]

## 2024-09-25 NOTE — Progress Notes (Signed)
 Complex Care Management Care Guide Note  09/25/2024 Name: [name] MRN: [id number] DOB: 24-Sep-1985  [name] is a 38 y.o. year old female who is a primary care patient of Theophilus Pagan, MD and is actively engaged with the care management team. I reached out to Julia CHRISTELLA Cart by phone today to assist with re-scheduling  with the RN Case Manager.  Follow up plan: Unsuccessful telephone outreach attempt made.  No further outreach attempts will be made at this time. We have been unable to contact the patient to reschedule for complex care management services.   Harlene Satterfield  [facility]  [facility], [facility] Guide  Direct Dial: [phone]  Fax [phone]

## 2024-12-10 ENCOUNTER — Ambulatory Visit: Admitting: Neurology
